# Patient Record
Sex: Female | Born: 1975 | Marital: Married | Smoking: Former smoker
Health system: Southern US, Community
[De-identification: ages and names within clinical notes are randomized; demographics above are authoritative.]

## PROBLEM LIST (undated history)

## (undated) DIAGNOSIS — R7303 Prediabetes: Secondary | ICD-10-CM

## (undated) DIAGNOSIS — T7840XA Allergy, unspecified, initial encounter: Secondary | ICD-10-CM

## (undated) DIAGNOSIS — C50919 Malignant neoplasm of unspecified site of unspecified female breast: Secondary | ICD-10-CM

## (undated) DIAGNOSIS — N289 Disorder of kidney and ureter, unspecified: Secondary | ICD-10-CM

## (undated) DIAGNOSIS — Z87442 Personal history of urinary calculi: Secondary | ICD-10-CM

## (undated) HISTORY — DX: Malignant neoplasm of unspecified site of unspecified female breast: C50.919

## (undated) HISTORY — PX: OTHER SURGICAL HISTORY: SHX169

## (undated) HISTORY — DX: Allergy, unspecified, initial encounter: T78.40XA

---

## 2018-05-10 ENCOUNTER — Encounter: Payer: Self-pay | Admitting: Internal Medicine

## 2018-05-10 ENCOUNTER — Ambulatory Visit: Payer: Federal, State, Local not specified - PPO | Attending: Internal Medicine | Admitting: Internal Medicine

## 2018-05-10 VITALS — BP 120/81 | HR 82 | Temp 98.5°F | Resp 16 | Ht 64.0 in | Wt 197.6 lb

## 2018-05-10 DIAGNOSIS — I4949 Other premature depolarization: Secondary | ICD-10-CM

## 2018-05-10 DIAGNOSIS — E669 Obesity, unspecified: Secondary | ICD-10-CM | POA: Diagnosis not present

## 2018-05-10 NOTE — Patient Instructions (Addendum)
Try to work on increasing physical activity.  Recommendation is to try and get in about 150 minutes/week of aerobic exercise.Marland Kitchen Please sign a release for [place] to get your medical records from your previous primary physician.

## 2018-05-10 NOTE — Progress Notes (Signed)
Patient ID: Diana Weaver, female    DOB: [birth year]  MRN: [id number]  CC: New Patient (Initial Visit)   Subjective: Diana Weaver is a 43 y.o. female  with a past medical history of gestational diabetes, epiglottic stenosis, soft 1/6 murmur and benign PVCs and PACs who presents to establish care.  Patient's husband is with her.    Patient and her family just relocated to [place] from [place].  Her previous PCP was Dr. Tressa Weaver  in [place], [place]. Patient gives past history of gestational diabetes and benign arrhythmias mainly PVCs and PACs.  She occasionally can feel premature heart beats.  Last physical was in July 2019.  Blood tests done at that time included blood sugar and cholesterol levels were normal per her report.  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  HM:  PAP July 2019.  She did not get the flu shot for this flu season.  Family history, social history and past surgical histories reviewed. No current outpatient medications on file prior to visit.   No current facility-administered medications on file prior to visit.     Allergies  Allergen Reactions  . Iodine     all  . Latex     all    Social History   Socioeconomic History  . Marital status: Married    Spouse name: Not on file  . Number of children: 1  . Years of education: Not on file  . Highest education level: Not on file  Occupational History  . Occupation: IT trainer  Social Needs  . Financial resource strain: Not on file  . Food insecurity:    Worry: Not on file    Inability: Not on file  . Transportation needs:    Medical: Not on file    Non-medical: Not on file  Tobacco Use  . Smoking status: Former Research scientist (life sciences)  . Smokeless tobacco: Never Used  Substance and Sexual Activity  . Alcohol use: Yes    Comment: occasionally  . Drug use: Never  . Sexual activity: Yes  Lifestyle  . Physical activity:      Days per week: Not on file    Minutes per session: Not on file  . Stress: Not on file  Relationships  . Social connections:    Talks on phone: Not on file    Gets together: Not on file    Attends religious service: Not on file    Active member of club or organization: Not on file    Attends meetings of clubs or organizations: Not on file    Relationship status: Not on file  . Intimate partner violence:    Fear of current or ex partner: Not on file    Emotionally abused: Not on file    Physically abused: Not on file    Forced sexual activity: Not on file  Other Topics Concern  . Not on file  Social History Narrative  . Not on file    Family History  Problem Relation Age of Onset  . Hypertension Mother   . Hyperlipidemia Mother   . Depression Father   . Hyperlipidemia Father   . Hypertension Father     ROS: Review of Systems  Constitutional: Negative for activity change, appetite change, chills, fatigue, fever and unexpected weight change.  Eyes: Negative for photophobia and visual disturbance.  Respiratory: Negative for cough, chest tightness, shortness of breath  and wheezing.   Cardiovascular: Negative for chest pain, palpitations and leg swelling.  Gastrointestinal: Negative for abdominal pain, constipation and diarrhea.  Endocrine: Negative for cold intolerance, heat intolerance, polydipsia, polyphagia and polyuria.  Genitourinary: Negative for menstrual problem and vaginal pain.  Skin: Negative for rash and wound.  Neurological: Negative for dizziness, speech difficulty, weakness, light-headedness and numbness.  Psychiatric/Behavioral: Negative for agitation.   PHYSICAL EXAM: BP 120/81   Pulse 82   Temp 98.5 F (36.9 C) (Oral)   Resp 16   Ht 5\' 4"  (1.626 m)   Wt 197 lb 9.6 oz (89.6 kg)   LMP 05/02/2018   SpO2 97%   BMI 33.92 kg/m    General appearance - alert, well appearing, middle-aged Caucasian female and in no distress Mental status - alert,  oriented to person, place, and time Eyes - pupils equal and reactive, extraocular eye movements intact Nose - normal and patent, no erythema, discharge or polyps Mouth - mucous membranes moist, pharynx normal without lesions Neck - supple, no significant adenopathy Chest - clear to auscultation, no wheezes, rales or rhonchi, symmetric air entry Heart - normal rate, regular rhythm, normal S1, S2.  I did not hear a murmur at this time.  Extremities - peripheral pulses normal, no pedal edema, no clubbing or cyanosis  ASSESSMENT AND PLAN: 1. Obesity (BMI 30-39.9)  Patient counseled on weight lost, increasing physical activity and decreasing caloric intake.  Encouraged her to take advantage of gyms inside hotels where she be staying when she travels  2. Premature contraction Patient with history of premature contractions that are infrequent.  I did not hear any premature beats at this time.  - Previous medical records also requested   Patient was given the opportunity to ask questions.  Patient verbalized understanding of the plan and was able to repeat key elements of the plan.   Patient was seen with my nurse practitioner student.  Follow-Up in 6 months for routine lab screenings. Diana Plumber, MD, FACP

## 2018-10-06 DIAGNOSIS — Z6836 Body mass index (BMI) 36.0-36.9, adult: Secondary | ICD-10-CM | POA: Diagnosis not present

## 2018-10-06 DIAGNOSIS — Z01419 Encounter for gynecological examination (general) (routine) without abnormal findings: Secondary | ICD-10-CM | POA: Diagnosis not present

## 2018-10-07 ENCOUNTER — Other Ambulatory Visit: Payer: Self-pay | Admitting: Obstetrics and Gynecology

## 2018-10-07 DIAGNOSIS — N6310 Unspecified lump in the right breast, unspecified quadrant: Secondary | ICD-10-CM

## 2018-10-13 ENCOUNTER — Other Ambulatory Visit: Payer: Self-pay

## 2018-10-13 ENCOUNTER — Other Ambulatory Visit: Payer: Self-pay | Admitting: Obstetrics and Gynecology

## 2018-10-13 ENCOUNTER — Ambulatory Visit
Admission: RE | Admit: 2018-10-13 | Discharge: 2018-10-13 | Disposition: A | Discharge status: Home / Self Care | Payer: Federal, State, Local not specified - PPO | Source: Ambulatory Visit | Attending: Obstetrics and Gynecology | Admitting: Obstetrics and Gynecology

## 2018-10-13 DIAGNOSIS — N6001 Solitary cyst of right breast: Secondary | ICD-10-CM

## 2018-10-13 DIAGNOSIS — R922 Inconclusive mammogram: Secondary | ICD-10-CM | POA: Diagnosis not present

## 2018-10-13 DIAGNOSIS — N6310 Unspecified lump in the right breast, unspecified quadrant: Secondary | ICD-10-CM

## 2018-10-13 DIAGNOSIS — N6311 Unspecified lump in the right breast, upper outer quadrant: Secondary | ICD-10-CM | POA: Diagnosis not present

## 2018-10-13 IMAGING — US ULTRASOUND RIGHT BREAST LIMITED
1 series · 9 of 9 positions shown · non-contrast
Comparison: No prior exams available at this time.
COMPARISON: No prior exams available at this time.

Addendum:
CLINICAL DATA: Patient presents with a tender lump in the upper
outer to lateral right breast. She has a history multiple breast
cysts.

EXAM:
DIGITAL DIAGNOSTIC BILATERAL MAMMOGRAM WITH CAD AND TOMO
ULTRASOUND RIGHT BREAST

[Series 1: ultrasound right breast limited · 0.06mm/px · 9 of 9 slices shown]
[im 1/9]
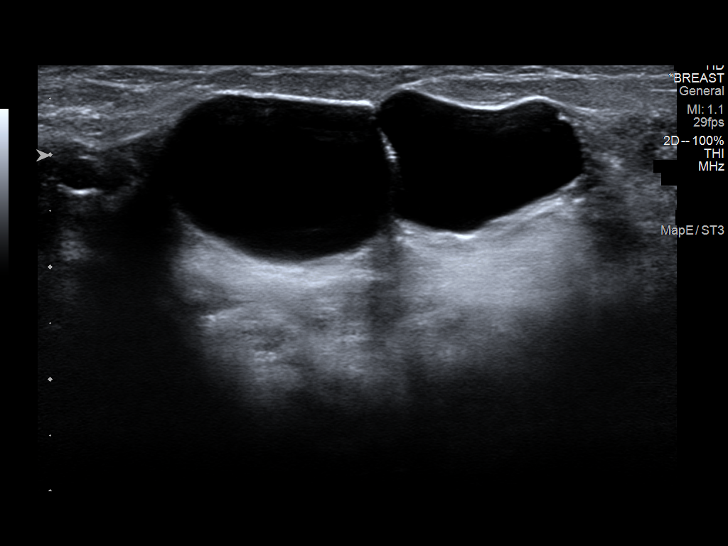
[im 2/9]
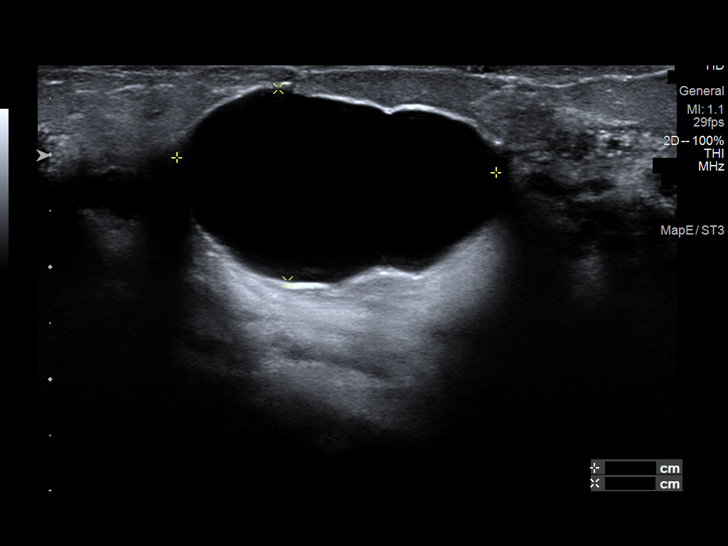
[im 3/9]
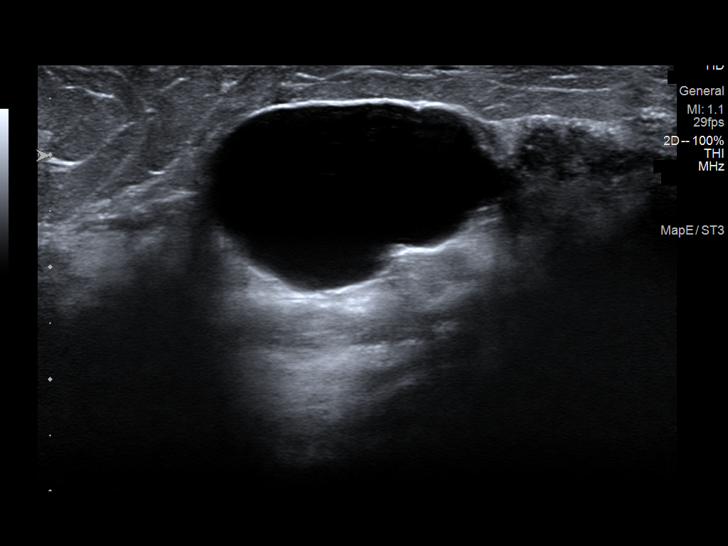
[im 4/9]
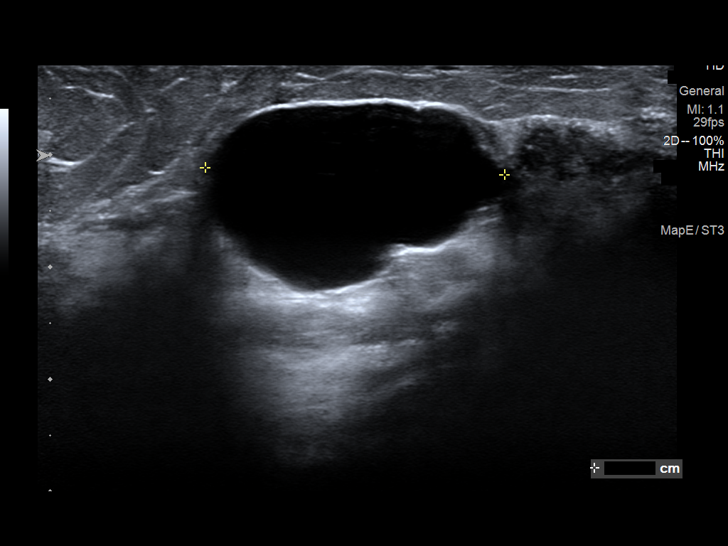
[im 5/9]
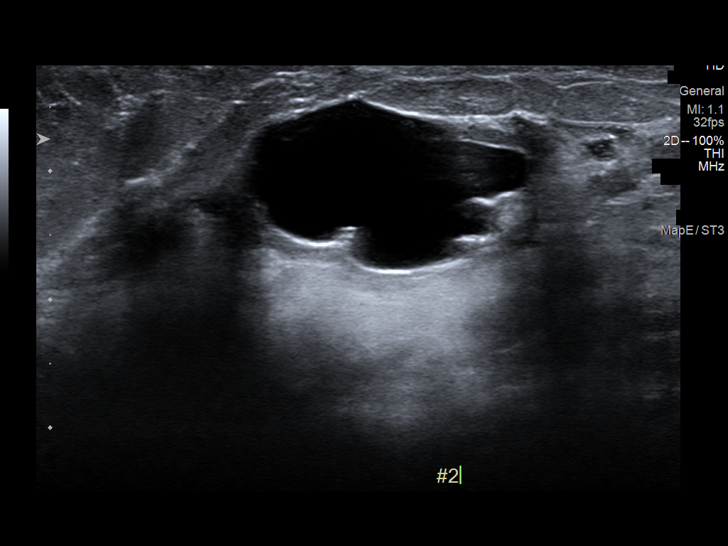
[im 6/9]
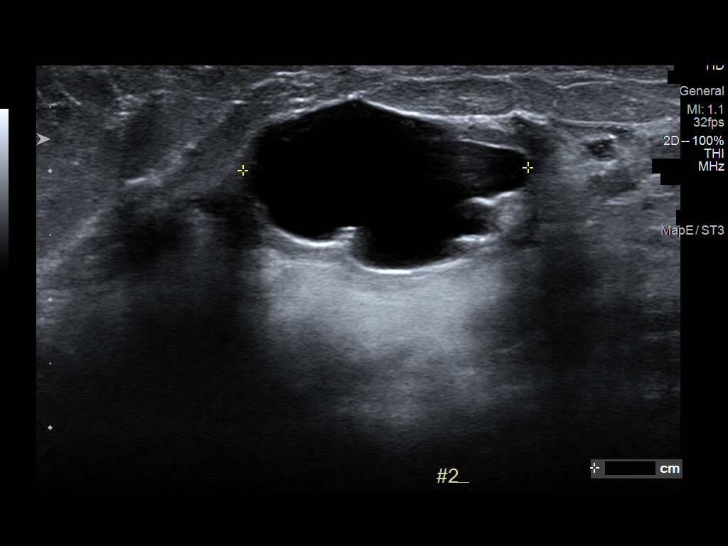
[im 7/9]
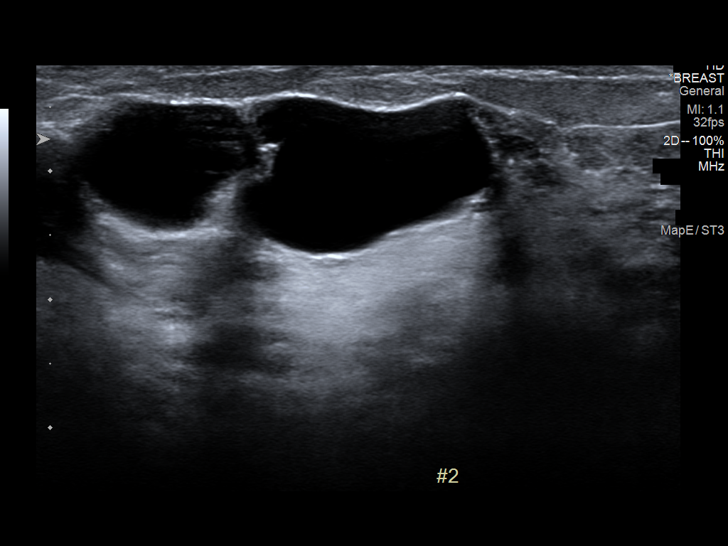
[im 8/9]
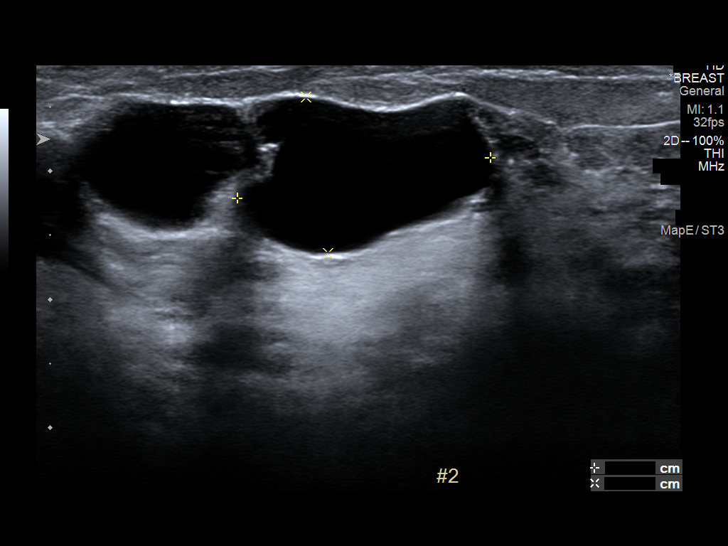
[im 9/9]
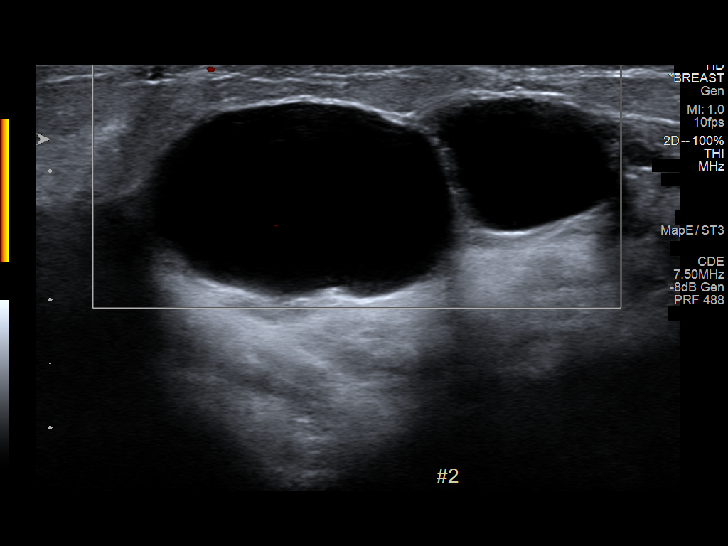

[9 of 9 positions shown; findings below may reference images not displayed]

If the patient's
prior studies become available for comparison, an addendum will be
added to this report.

ACR Breast Density Category c: The breast tissue is heterogeneously
dense, which may obscure small masses.
FINDINGS: In the area of the palpable abnormality, there is a circumscribed
oval mass.

There are multiple bilateral circumscribed and partly circumscribed
masses consistent with numerous cysts.

There are no areas of architectural distortion. There are no
suspicious calcifications.

Mammographic images were processed with CAD.

On physical exam, there is a tender ballotable mobile mass in the
lateral right breast. Another small masses palpated in the 12
o'clock position of the right breast.

Targeted ultrasound is performed, showing numerous right breast
cysts. The palpable abnormality corresponds to a cyst at 10 o'clock,
7 cm the nipple, measuring 2.8 x 1.7 x 2.7 cm. There is a smaller
adjacent cyst, 2 cm in long axis. A small cyst is seen at 12
o'clock, corresponding to the other palpable abnormality. There are
no solid masses or suspicious lesions.
IMPRESSION: 1. No evidence of breast malignancy.
2. Numerous bilateral circumscribed breast masses consistent with
cysts.
3. The palpable abnormality is due to a 2.8 cm cyst with an adjacent
2 cm cyst.

RECOMMENDATION:
1.  Screening mammogram in one year.(Code:[0L]).
2. Aspiration of the 10 o'clock position right breast cyst will be
performed, since this is causing the patient discomfort. This will
be performed today.

I have discussed the findings and recommendations with the patient.
Results were also provided in writing at the conclusion of the
visit. If applicable, a reminder letter will be sent to the patient
regarding the next appointment.

BI-RADS CATEGORY  2: Benign.

ADDENDUM:
Prior mammograms and breast ultrasound from [DATE] have become
available. There has been no significant interval change. No change
to the current impression, BI-RADS category or recommendation.

*** End of Addendum ***
If the patient's
prior studies become available for comparison, an addendum will be
added to this report.

ACR Breast Density Category c: The breast tissue is heterogeneously
dense, which may obscure small masses.
FINDINGS: In the area of the palpable abnormality, there is a circumscribed
oval mass.

There are multiple bilateral circumscribed and partly circumscribed
masses consistent with numerous cysts.

There are no areas of architectural distortion. There are no
suspicious calcifications.

Mammographic images were processed with CAD.

On physical exam, there is a tender ballotable mobile mass in the
lateral right breast. Another small masses palpated in the 12
o'clock position of the right breast.

Targeted ultrasound is performed, showing numerous right breast
cysts. The palpable abnormality corresponds to a cyst at 10 o'clock,
7 cm the nipple, measuring 2.8 x 1.7 x 2.7 cm. There is a smaller
adjacent cyst, 2 cm in long axis. A small cyst is seen at 12
o'clock, corresponding to the other palpable abnormality. There are
no solid masses or suspicious lesions.
IMPRESSION: 1. No evidence of breast malignancy.
2. Numerous bilateral circumscribed breast masses consistent with
cysts.
3. The palpable abnormality is due to a 2.8 cm cyst with an adjacent
2 cm cyst.

RECOMMENDATION:
1.  Screening mammogram in one year.(Code:[0L]).
2. Aspiration of the 10 o'clock position right breast cyst will be
performed, since this is causing the patient discomfort. This will
be performed today.

I have discussed the findings and recommendations with the patient.
Results were also provided in writing at the conclusion of the
visit. If applicable, a reminder letter will be sent to the patient
regarding the next appointment.

BI-RADS CATEGORY  2: Benign.

## 2018-10-13 IMAGING — MG DIGITAL DIAGNOSTIC BILATERAL MAMMOGRAM WITH TOMO AND CAD
5 of 10 series · 5 of 30 positions shown · non-contrast
Comparison: No prior exams available at this time.
COMPARISON: No prior exams available at this time.

Addendum:
CLINICAL DATA: Patient presents with a tender lump in the upper
outer to lateral right breast. She has a history multiple breast
cysts.

EXAM:
DIGITAL DIAGNOSTIC BILATERAL MAMMOGRAM WITH CAD AND TOMO
ULTRASOUND RIGHT BREAST

[R CC synth-2D]
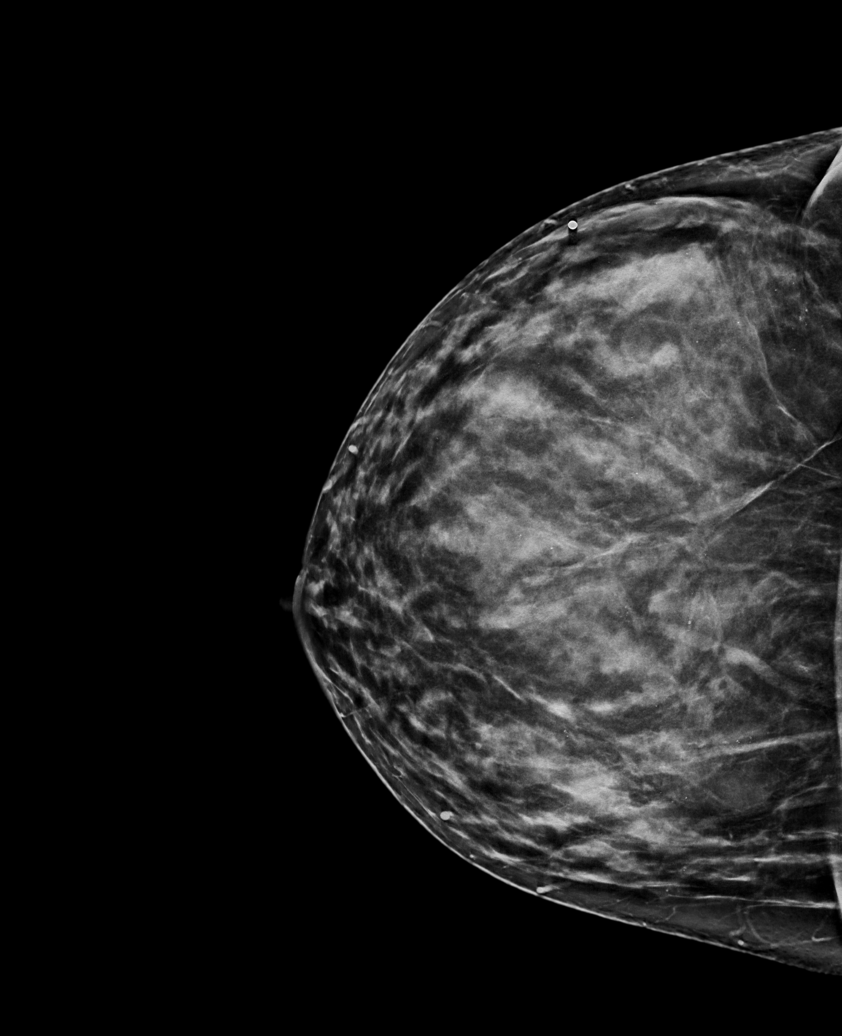

[L MLO synth-2D]
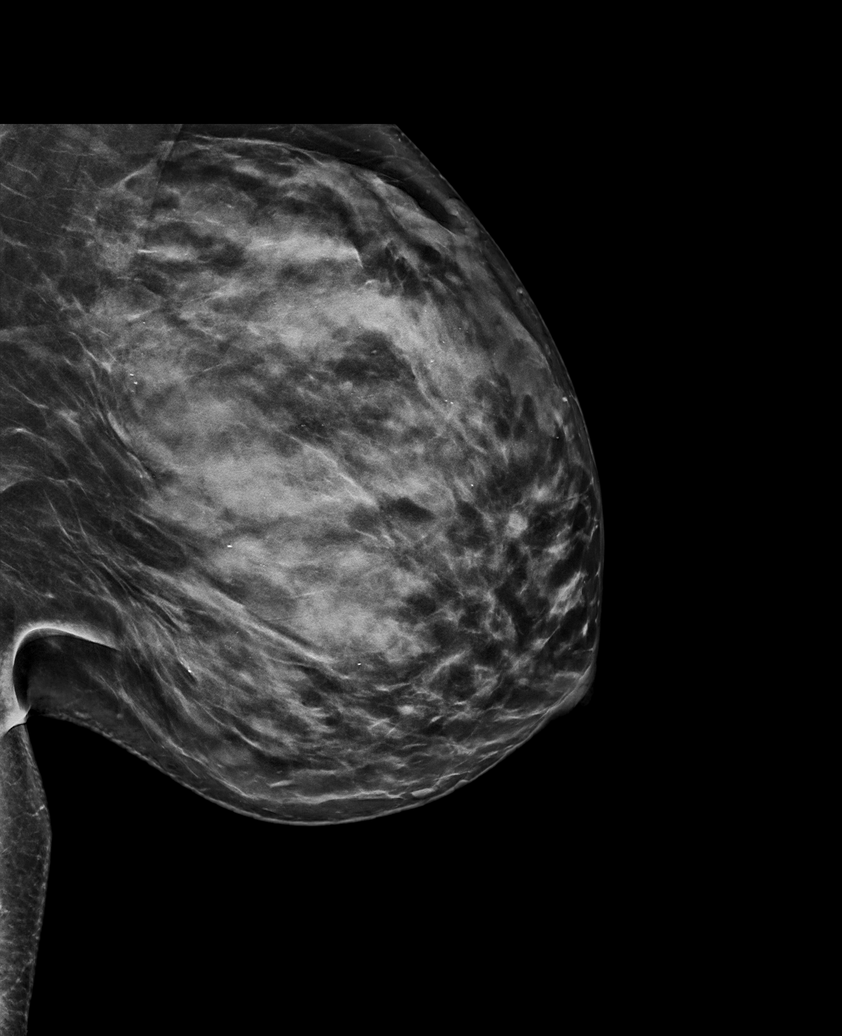

[L CC synth-2D]
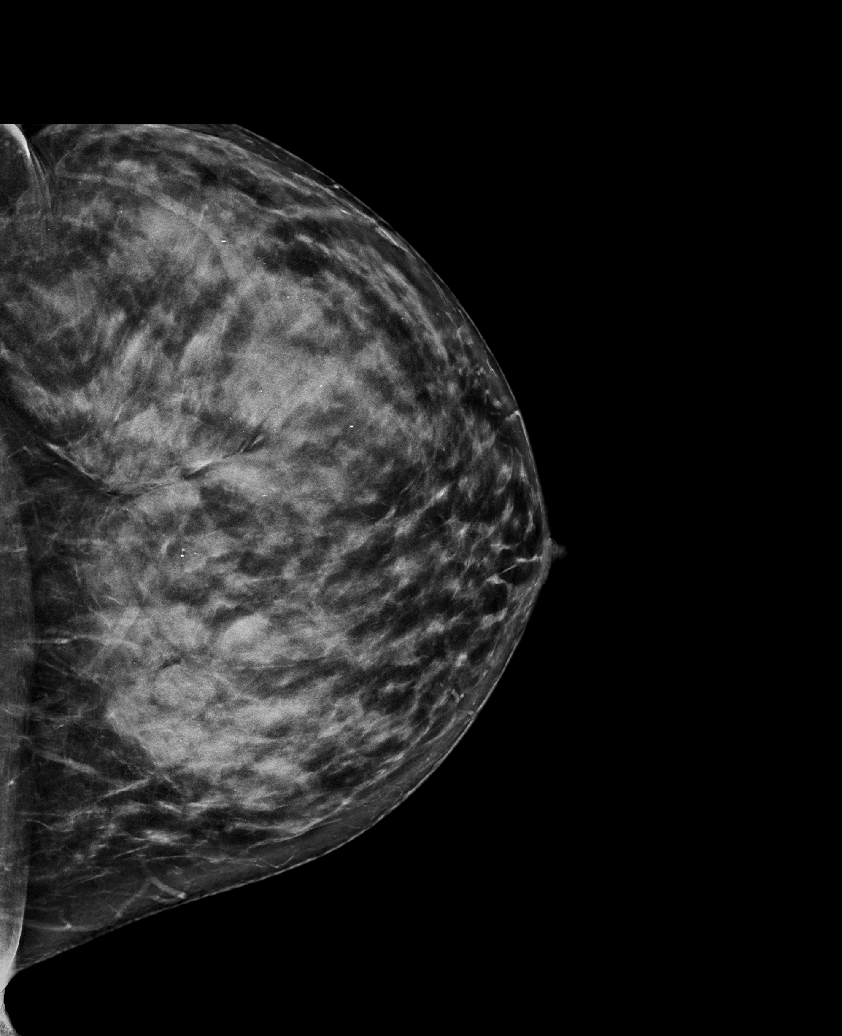

[R TAN synth-2D]
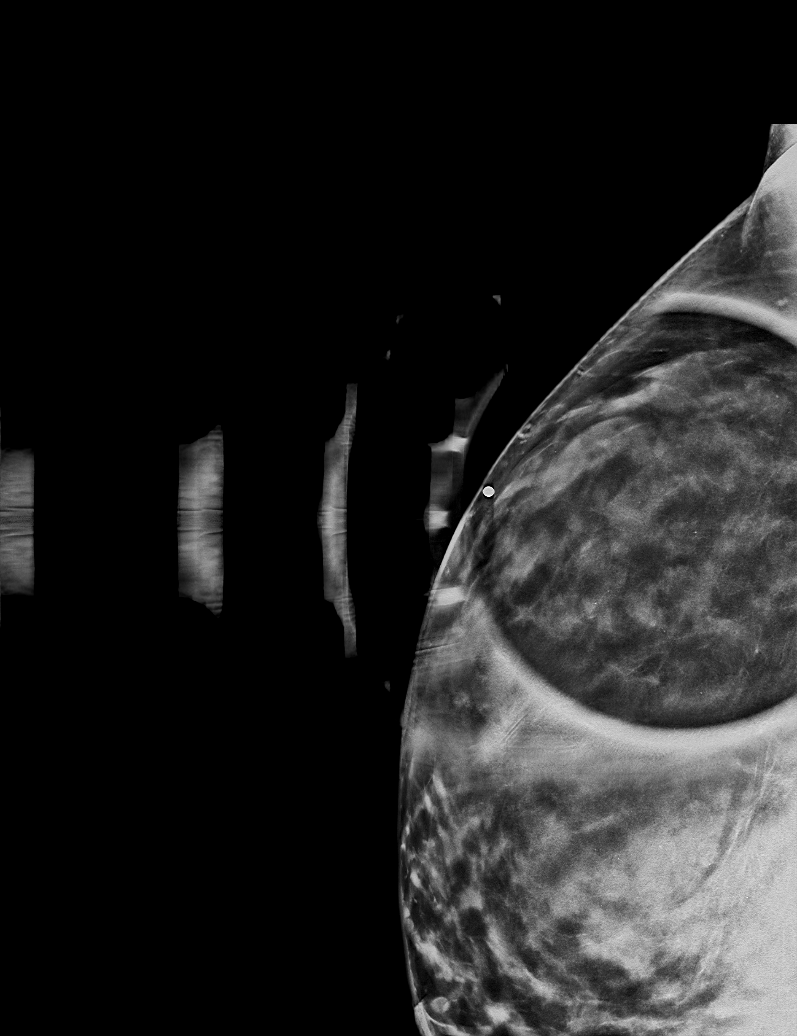

[R MLO synth-2D]
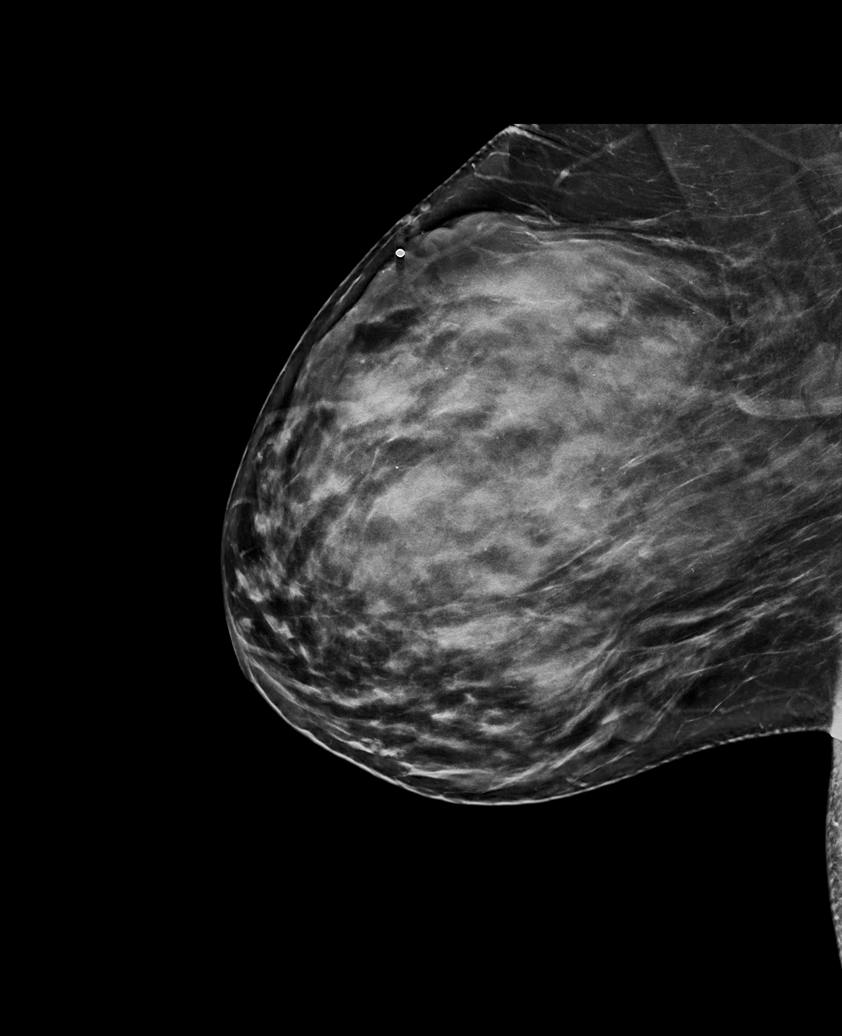

[5 of 30 positions shown; findings below may reference images not displayed]

If the patient's
prior studies become available for comparison, an addendum will be
added to this report.

ACR Breast Density Category c: The breast tissue is heterogeneously
dense, which may obscure small masses.
FINDINGS: In the area of the palpable abnormality, there is a circumscribed
oval mass.

There are multiple bilateral circumscribed and partly circumscribed
masses consistent with numerous cysts.

There are no areas of architectural distortion. There are no
suspicious calcifications.

Mammographic images were processed with CAD.

On physical exam, there is a tender ballotable mobile mass in the
lateral right breast. Another small masses palpated in the 12
o'clock position of the right breast.

Targeted ultrasound is performed, showing numerous right breast
cysts. The palpable abnormality corresponds to a cyst at 10 o'clock,
7 cm the nipple, measuring 2.8 x 1.7 x 2.7 cm. There is a smaller
adjacent cyst, 2 cm in long axis. A small cyst is seen at 12
o'clock, corresponding to the other palpable abnormality. There are
no solid masses or suspicious lesions.
IMPRESSION: 1. No evidence of breast malignancy.
2. Numerous bilateral circumscribed breast masses consistent with
cysts.
3. The palpable abnormality is due to a 2.8 cm cyst with an adjacent
2 cm cyst.

RECOMMENDATION:
1.  Screening mammogram in one year.(Code:[0L]).
2. Aspiration of the 10 o'clock position right breast cyst will be
performed, since this is causing the patient discomfort. This will
be performed today.

I have discussed the findings and recommendations with the patient.
Results were also provided in writing at the conclusion of the
visit. If applicable, a reminder letter will be sent to the patient
regarding the next appointment.

BI-RADS CATEGORY  2: Benign.

ADDENDUM:
Prior mammograms and breast ultrasound from [DATE] have become
available. There has been no significant interval change. No change
to the current impression, BI-RADS category or recommendation.

*** End of Addendum ***
If the patient's
prior studies become available for comparison, an addendum will be
added to this report.

ACR Breast Density Category c: The breast tissue is heterogeneously
dense, which may obscure small masses.
FINDINGS: In the area of the palpable abnormality, there is a circumscribed
oval mass.

There are multiple bilateral circumscribed and partly circumscribed
masses consistent with numerous cysts.

There are no areas of architectural distortion. There are no
suspicious calcifications.

Mammographic images were processed with CAD.

On physical exam, there is a tender ballotable mobile mass in the
lateral right breast. Another small masses palpated in the 12
o'clock position of the right breast.

Targeted ultrasound is performed, showing numerous right breast
cysts. The palpable abnormality corresponds to a cyst at 10 o'clock,
7 cm the nipple, measuring 2.8 x 1.7 x 2.7 cm. There is a smaller
adjacent cyst, 2 cm in long axis. A small cyst is seen at 12
o'clock, corresponding to the other palpable abnormality. There are
no solid masses or suspicious lesions.
IMPRESSION: 1. No evidence of breast malignancy.
2. Numerous bilateral circumscribed breast masses consistent with
cysts.
3. The palpable abnormality is due to a 2.8 cm cyst with an adjacent
2 cm cyst.

RECOMMENDATION:
1.  Screening mammogram in one year.(Code:[0L]).
2. Aspiration of the 10 o'clock position right breast cyst will be
performed, since this is causing the patient discomfort. This will
be performed today.

I have discussed the findings and recommendations with the patient.
Results were also provided in writing at the conclusion of the
visit. If applicable, a reminder letter will be sent to the patient
regarding the next appointment.

BI-RADS CATEGORY  2: Benign.

## 2018-10-13 IMAGING — US US BREAST ASPIRATION
1 series · 5 of 5 positions shown · non-contrast
Comparison: [DATE]

CLINICAL DATA: Patient's painful mass in the UPPER-OUTER QUADRANT
of the RIGHT breast, shown to represent adjacent simple cysts in the
10 o'clock location of the RIGHT breast. Request is made for cyst
aspiration.

EXAM:
ULTRASOUND GUIDED RIGHT BREAST CYST ASPIRATION

[Series 1: us breast aspiration · 0.06mm/px · 5 of 5 slices shown]
[im 1/5]
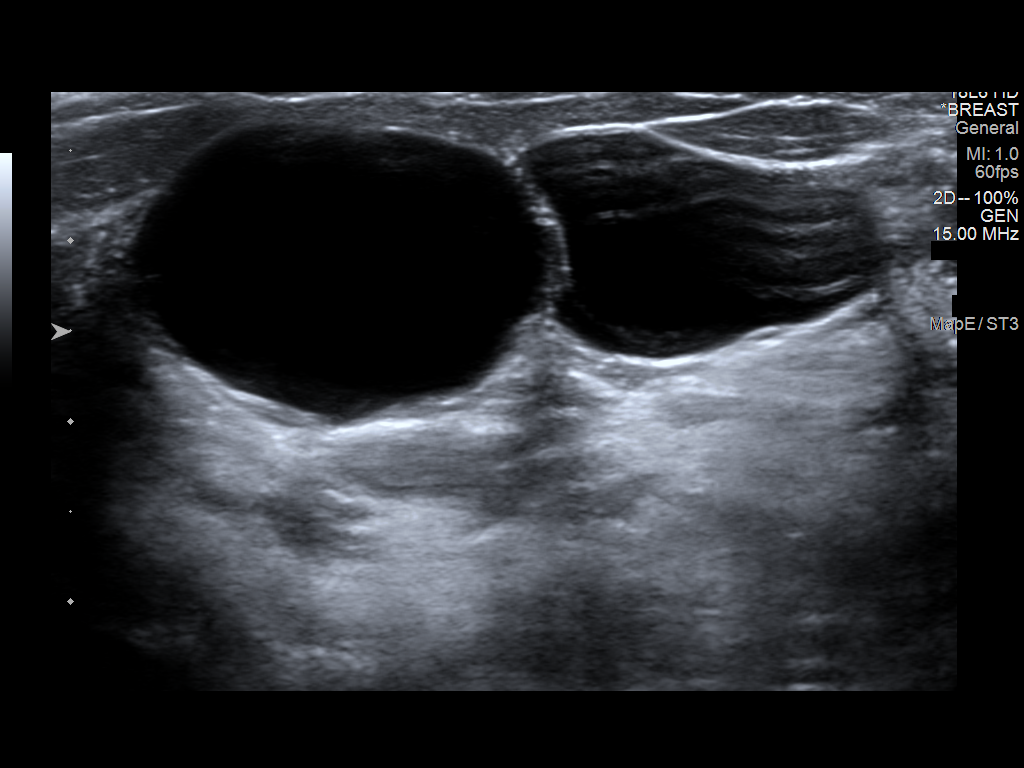
[im 2/5]
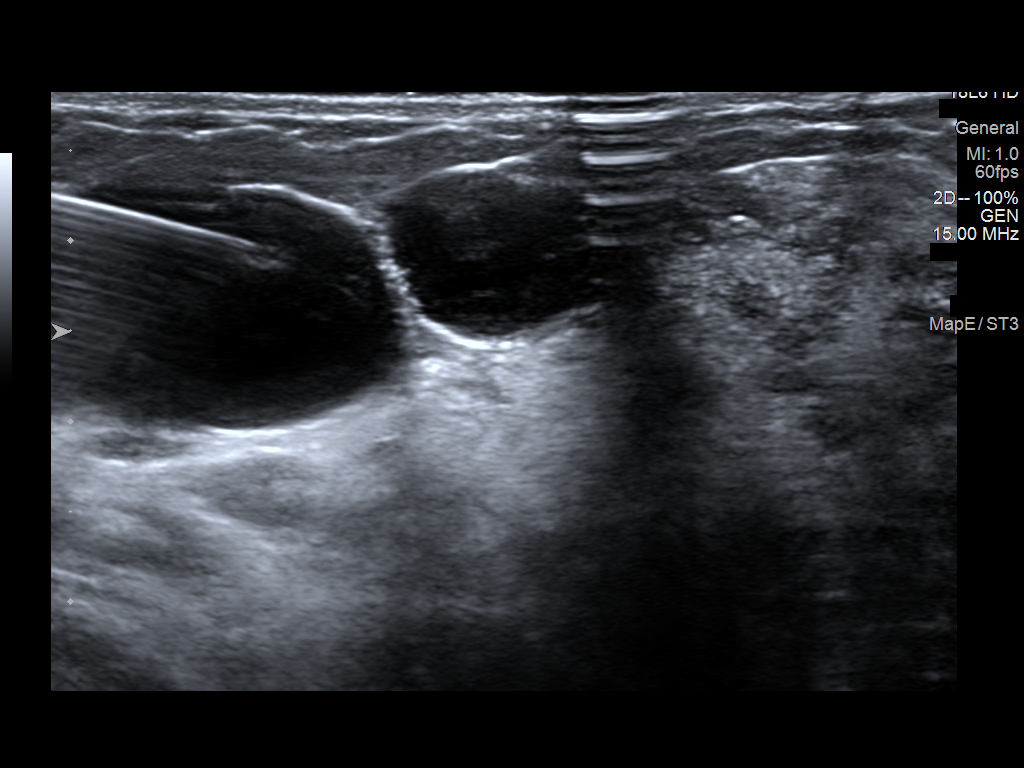
[im 3/5]
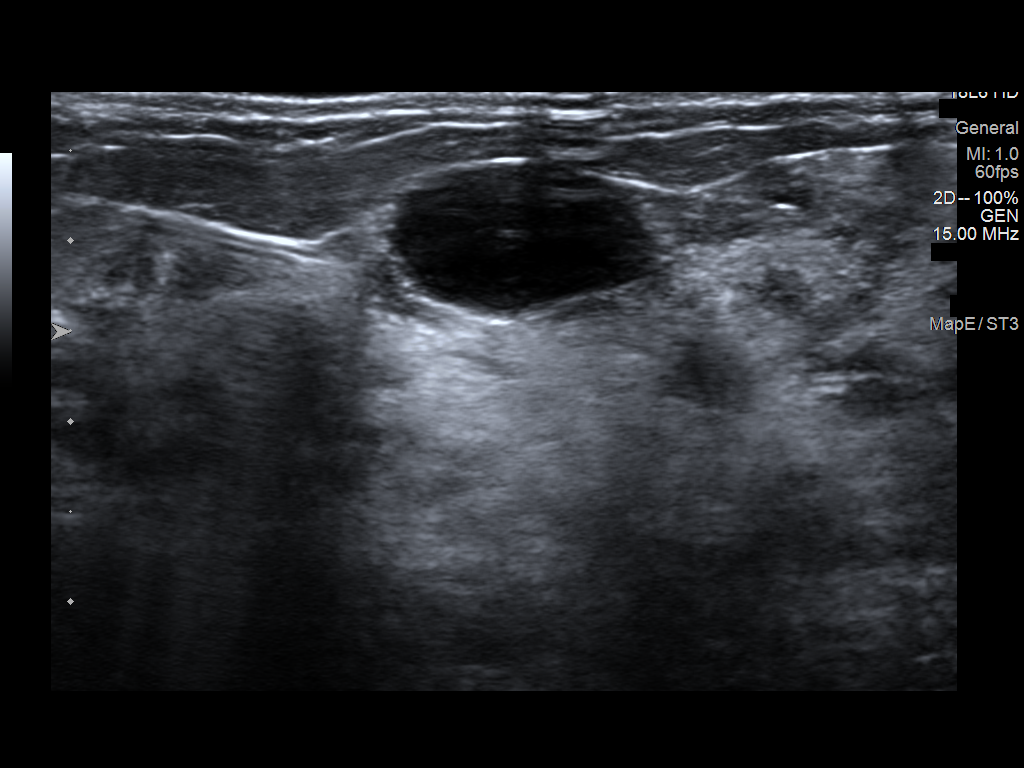
[im 4/5]
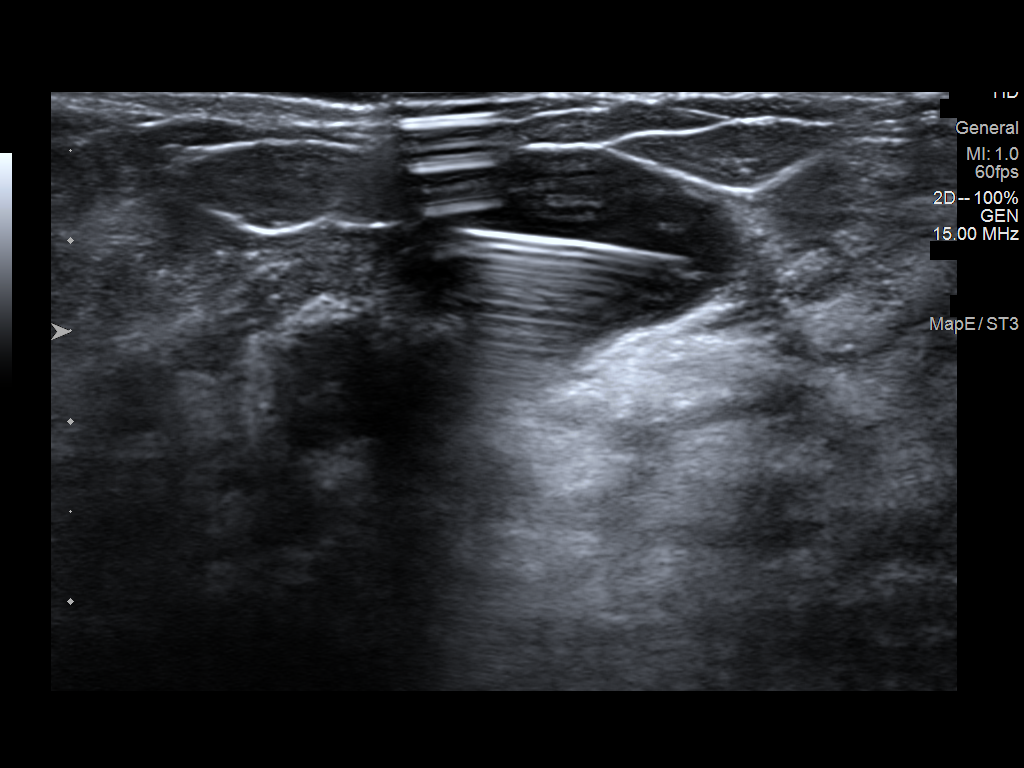
[im 5/5]
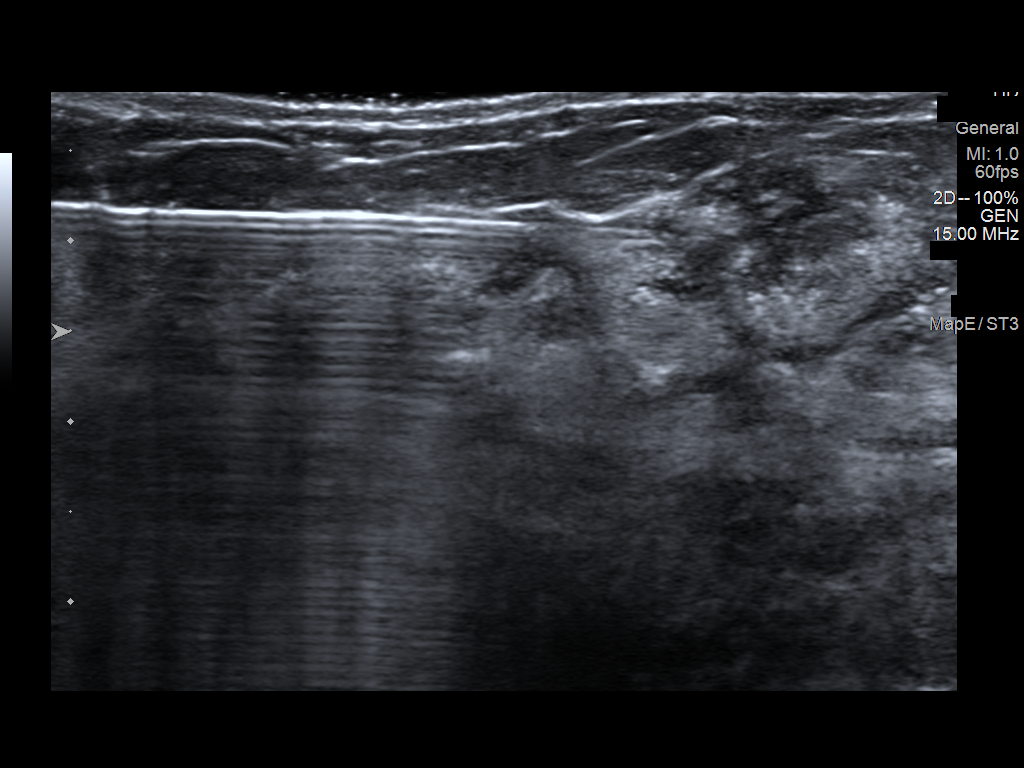

[5 of 5 positions shown; findings below may reference images not displayed]

PROCEDURE:
Using sterile technique, 1% lidocaine, under direct ultrasound
visualization, needle aspiration of adjacent simple cysts in the 10
o'clock location of the RIGHT breast was performed. 6 ml of clear
yellowish fluid was aspirated from the more LATERAL lesion; 4 ml of
clear yellowish fluid was aspirated from the more MEDIAL adjacent
lesions or a combined total of 10 ml. Given the benign appearance,
the fluid was discarded.
IMPRESSION: Ultrasound-guided aspiration of adjacent RIGHT breast cysts no
apparent complications.

RECOMMENDATIONS:
Recommend screening mammogram in [DATE].

## 2018-10-29 ENCOUNTER — Other Ambulatory Visit: Payer: Self-pay

## 2018-10-29 DIAGNOSIS — Z20822 Contact with and (suspected) exposure to covid-19: Secondary | ICD-10-CM

## 2018-10-30 LAB — NOVEL CORONAVIRUS, NAA: SARS-CoV-2, NAA: NOT DETECTED

## 2019-01-04 DIAGNOSIS — F419 Anxiety disorder, unspecified: Secondary | ICD-10-CM | POA: Diagnosis not present

## 2019-01-04 DIAGNOSIS — Z23 Encounter for immunization: Secondary | ICD-10-CM | POA: Diagnosis not present

## 2019-01-04 DIAGNOSIS — I493 Ventricular premature depolarization: Secondary | ICD-10-CM | POA: Diagnosis not present

## 2019-01-04 DIAGNOSIS — E669 Obesity, unspecified: Secondary | ICD-10-CM | POA: Diagnosis not present

## 2019-01-04 DIAGNOSIS — E7849 Other hyperlipidemia: Secondary | ICD-10-CM | POA: Diagnosis not present

## 2019-01-11 DIAGNOSIS — F329 Major depressive disorder, single episode, unspecified: Secondary | ICD-10-CM | POA: Diagnosis not present

## 2019-01-11 DIAGNOSIS — F419 Anxiety disorder, unspecified: Secondary | ICD-10-CM | POA: Diagnosis not present

## 2019-02-08 DIAGNOSIS — F329 Major depressive disorder, single episode, unspecified: Secondary | ICD-10-CM | POA: Diagnosis not present

## 2019-02-08 DIAGNOSIS — F419 Anxiety disorder, unspecified: Secondary | ICD-10-CM | POA: Diagnosis not present

## 2019-02-22 DIAGNOSIS — F329 Major depressive disorder, single episode, unspecified: Secondary | ICD-10-CM | POA: Diagnosis not present

## 2019-02-22 DIAGNOSIS — F419 Anxiety disorder, unspecified: Secondary | ICD-10-CM | POA: Diagnosis not present

## 2019-06-02 DIAGNOSIS — Z23 Encounter for immunization: Secondary | ICD-10-CM | POA: Diagnosis not present

## 2019-06-29 DIAGNOSIS — Z23 Encounter for immunization: Secondary | ICD-10-CM | POA: Diagnosis not present

## 2019-10-26 DIAGNOSIS — Z01419 Encounter for gynecological examination (general) (routine) without abnormal findings: Secondary | ICD-10-CM | POA: Diagnosis not present

## 2019-10-26 DIAGNOSIS — Z6835 Body mass index (BMI) 35.0-35.9, adult: Secondary | ICD-10-CM | POA: Diagnosis not present

## 2019-11-14 DIAGNOSIS — Z1231 Encounter for screening mammogram for malignant neoplasm of breast: Secondary | ICD-10-CM | POA: Diagnosis not present

## 2019-11-30 DIAGNOSIS — M25571 Pain in right ankle and joints of right foot: Secondary | ICD-10-CM | POA: Diagnosis not present

## 2019-11-30 DIAGNOSIS — S93401A Sprain of unspecified ligament of right ankle, initial encounter: Secondary | ICD-10-CM | POA: Diagnosis not present

## 2019-12-05 DIAGNOSIS — Z1159 Encounter for screening for other viral diseases: Secondary | ICD-10-CM | POA: Diagnosis not present

## 2019-12-20 DIAGNOSIS — Z23 Encounter for immunization: Secondary | ICD-10-CM | POA: Diagnosis not present

## 2019-12-26 DIAGNOSIS — M25571 Pain in right ankle and joints of right foot: Secondary | ICD-10-CM | POA: Diagnosis not present

## 2019-12-26 DIAGNOSIS — S93401A Sprain of unspecified ligament of right ankle, initial encounter: Secondary | ICD-10-CM | POA: Diagnosis not present

## 2020-01-16 DIAGNOSIS — M25571 Pain in right ankle and joints of right foot: Secondary | ICD-10-CM | POA: Diagnosis not present

## 2020-03-12 DIAGNOSIS — M25571 Pain in right ankle and joints of right foot: Secondary | ICD-10-CM | POA: Diagnosis not present

## 2020-06-07 ENCOUNTER — Emergency Department (HOSPITAL_COMMUNITY): Payer: Federal, State, Local not specified - PPO

## 2020-06-07 ENCOUNTER — Encounter (HOSPITAL_COMMUNITY): Payer: Self-pay | Admitting: Emergency Medicine

## 2020-06-07 ENCOUNTER — Emergency Department (HOSPITAL_COMMUNITY)
Admission: EM | Admit: 2020-06-07 | Discharge: 2020-06-07 | Disposition: A | Discharge status: Home / Self Care | Payer: Federal, State, Local not specified - PPO | Attending: Emergency Medicine | Admitting: Emergency Medicine

## 2020-06-07 ENCOUNTER — Other Ambulatory Visit: Payer: Self-pay

## 2020-06-07 DIAGNOSIS — N132 Hydronephrosis with renal and ureteral calculous obstruction: Secondary | ICD-10-CM | POA: Insufficient documentation

## 2020-06-07 DIAGNOSIS — Z91040 Latex allergy status: Secondary | ICD-10-CM | POA: Diagnosis not present

## 2020-06-07 DIAGNOSIS — K429 Umbilical hernia without obstruction or gangrene: Secondary | ICD-10-CM | POA: Diagnosis not present

## 2020-06-07 DIAGNOSIS — N83202 Unspecified ovarian cyst, left side: Secondary | ICD-10-CM

## 2020-06-07 DIAGNOSIS — N23 Unspecified renal colic: Secondary | ICD-10-CM

## 2020-06-07 DIAGNOSIS — K8020 Calculus of gallbladder without cholecystitis without obstruction: Secondary | ICD-10-CM | POA: Diagnosis not present

## 2020-06-07 DIAGNOSIS — N83292 Other ovarian cyst, left side: Secondary | ICD-10-CM | POA: Diagnosis not present

## 2020-06-07 DIAGNOSIS — Z87891 Personal history of nicotine dependence: Secondary | ICD-10-CM | POA: Insufficient documentation

## 2020-06-07 DIAGNOSIS — R109 Unspecified abdominal pain: Secondary | ICD-10-CM | POA: Diagnosis not present

## 2020-06-07 DIAGNOSIS — N83201 Unspecified ovarian cyst, right side: Secondary | ICD-10-CM | POA: Diagnosis not present

## 2020-06-07 LAB — URINALYSIS, ROUTINE W REFLEX MICROSCOPIC
Bilirubin Urine: NEGATIVE
Glucose, UA: NEGATIVE mg/dL
Ketones, ur: NEGATIVE mg/dL
Leukocytes,Ua: NEGATIVE
Nitrite: NEGATIVE
Protein, ur: NEGATIVE mg/dL
Specific Gravity, Urine: >1.03 — ABNORMAL HIGH (ref 1.005–1.030)
pH: 6 (ref 5.0–8.0)

## 2020-06-07 LAB — URINALYSIS, MICROSCOPIC (REFLEX)
Bacteria, UA: NONE SEEN
WBC, UA: NONE SEEN WBC/hpf (ref 0–5)

## 2020-06-07 IMAGING — CT CT RENAL STONE PROTOCOL
2 of 4 series · 15 of 46 positions shown, 17 images · non-contrast
Comparison: None.

CLINICAL DATA: 45-year-old female with right flank pain. Concern
for kidney stone.

EXAM:
CT ABDOMEN AND PELVIS WITHOUT CONTRAST
TECHNIQUE: Multidetector CT imaging of the abdomen and pelvis was performed
following the standard protocol without IV contrast.

[Series 2: axial st · axial · 0.78mm/px · z∈[+1086,+1516]mm · 12 of 98 slices shown, 14 images]
[im 6/98  soft-tissue]
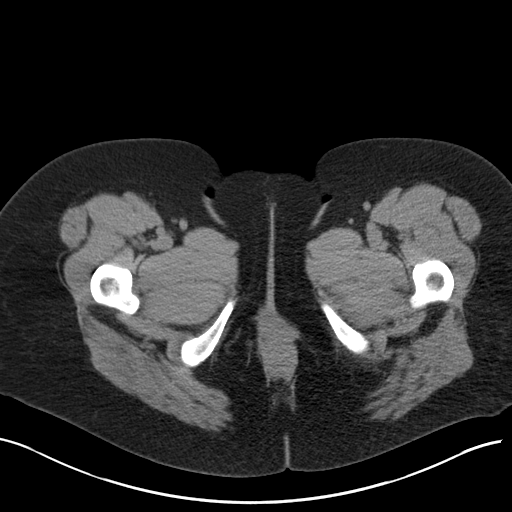
[im 6/98  bone]
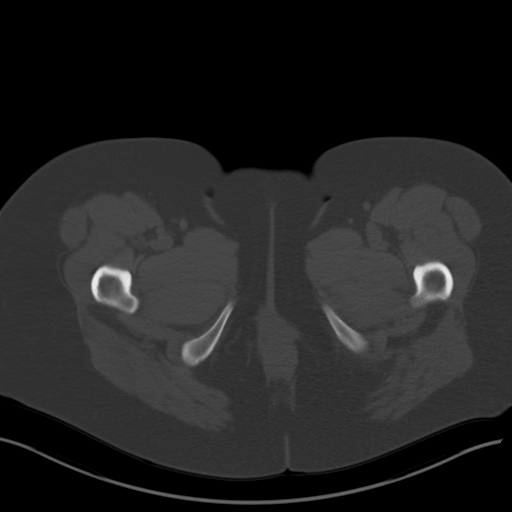
[im 18/98  soft-tissue]
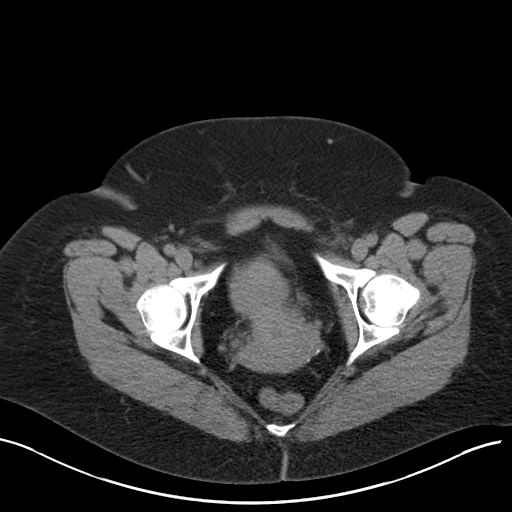
[im 23/98  soft-tissue]
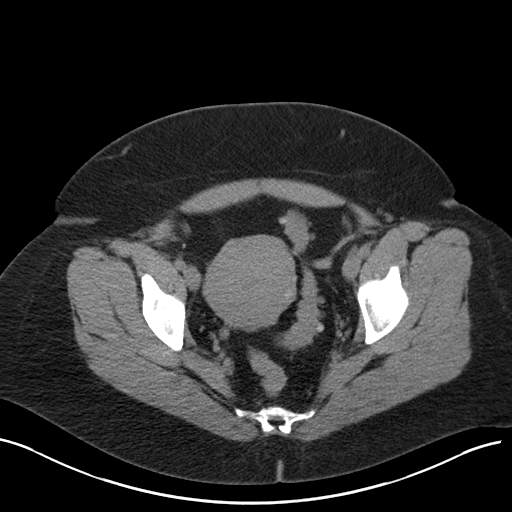
[im 29/98  soft-tissue]
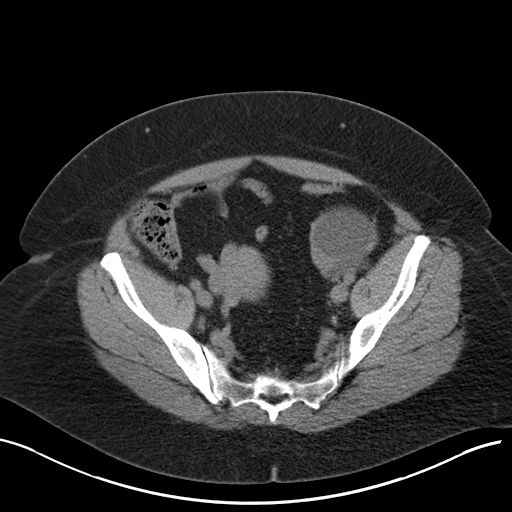
[im 40/98  soft-tissue]
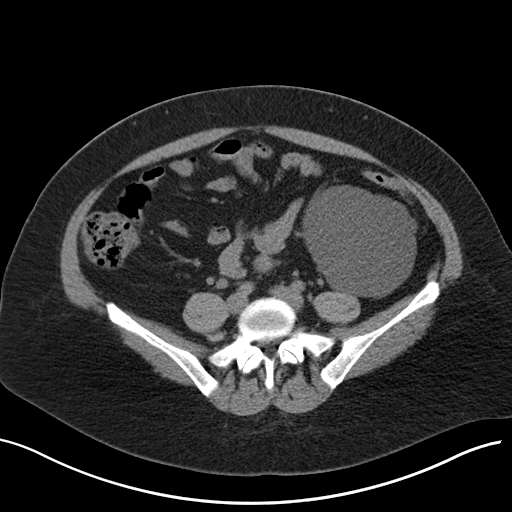
[im 46/98  soft-tissue]
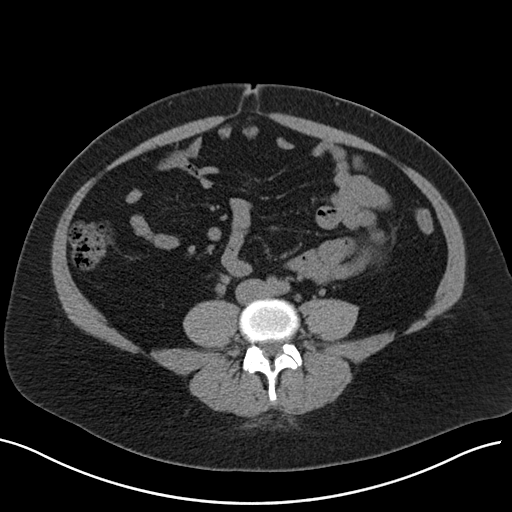
[im 52/98  soft-tissue]
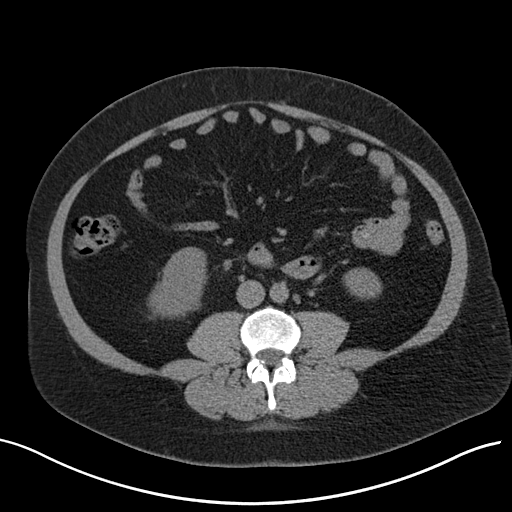
[im 63/98  soft-tissue]
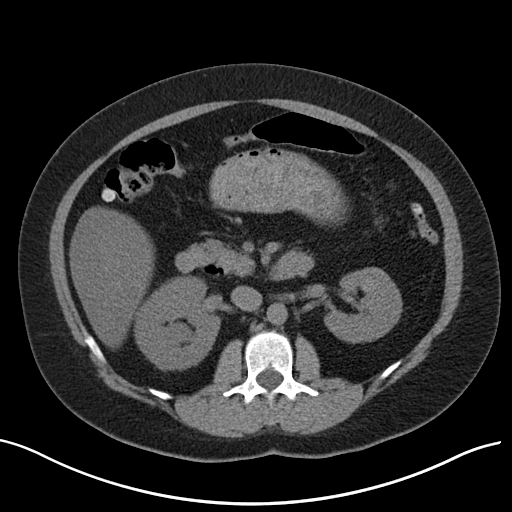
[im 69/98  soft-tissue]
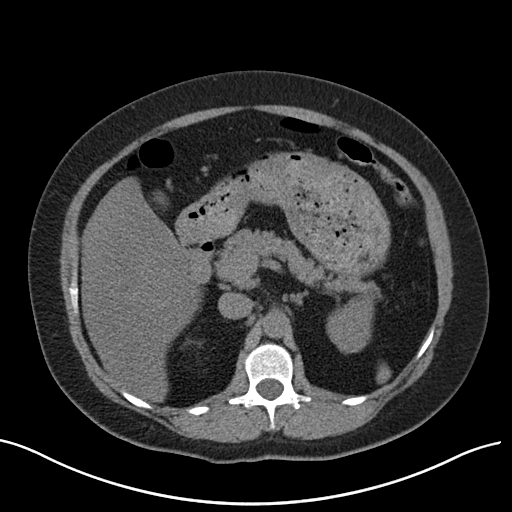
[im 69/98  bone]
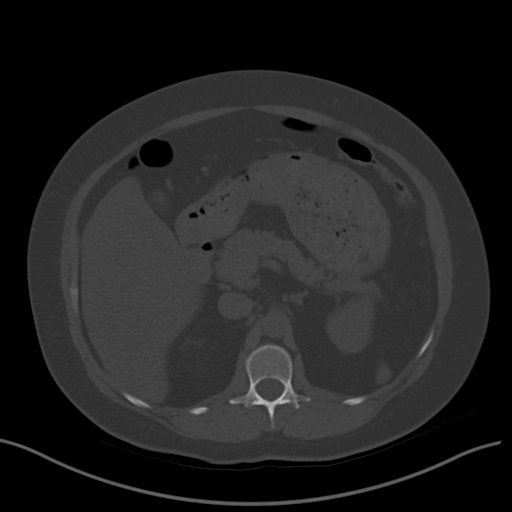
[im 75/98  soft-tissue]
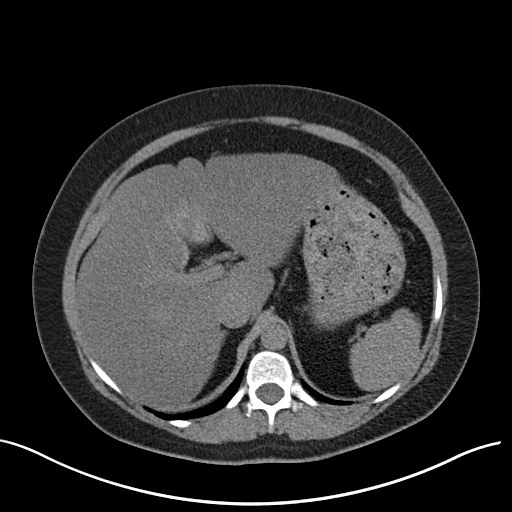
[im 86/98  soft-tissue]
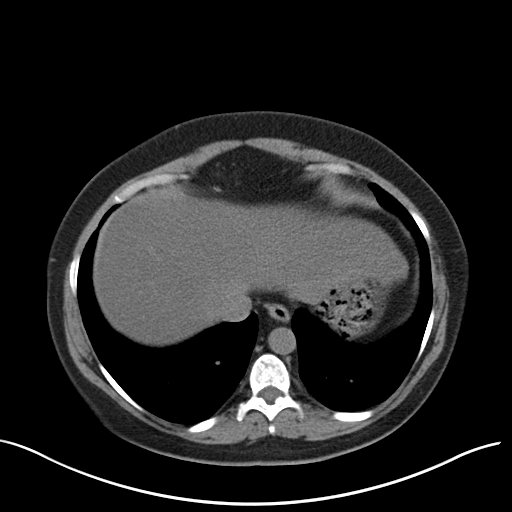
[im 92/98  soft-tissue]
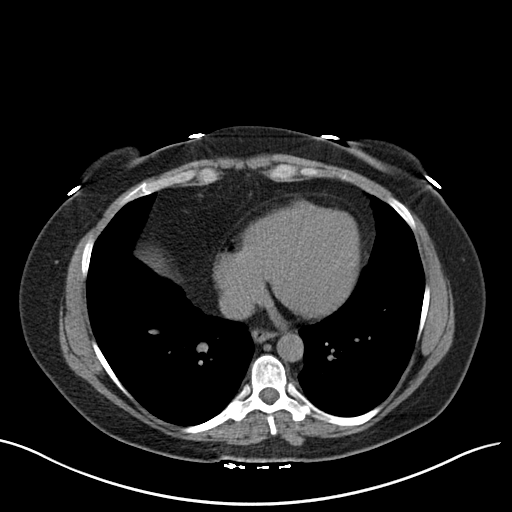

[Series 5: coronal · coronal · 0.88mm/px · 3 of 182 slices shown]
[im 61/182  soft-tissue]
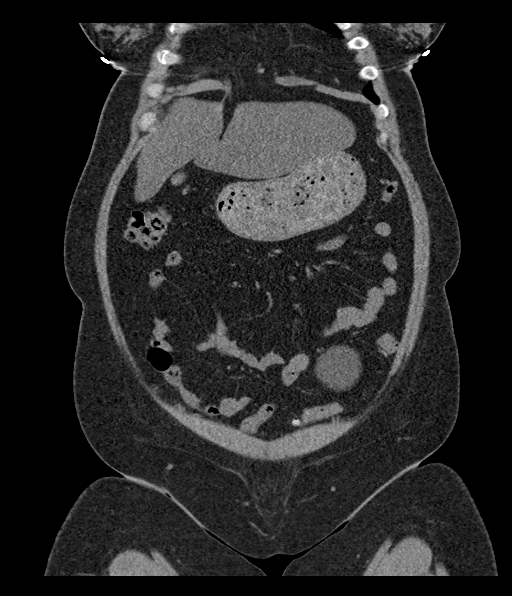
[im 81/182  soft-tissue]
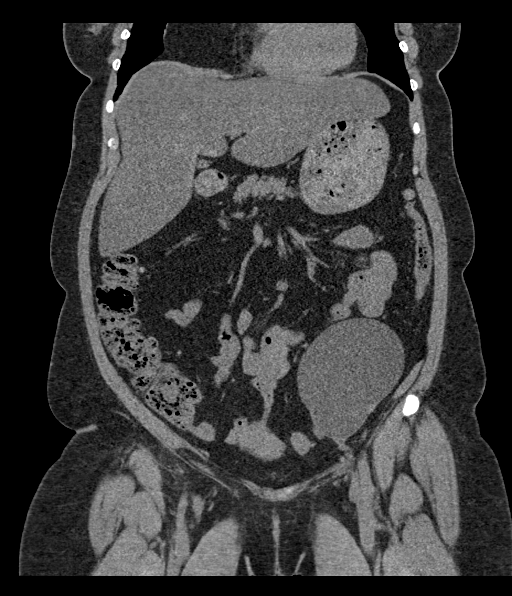
[im 101/182  soft-tissue]
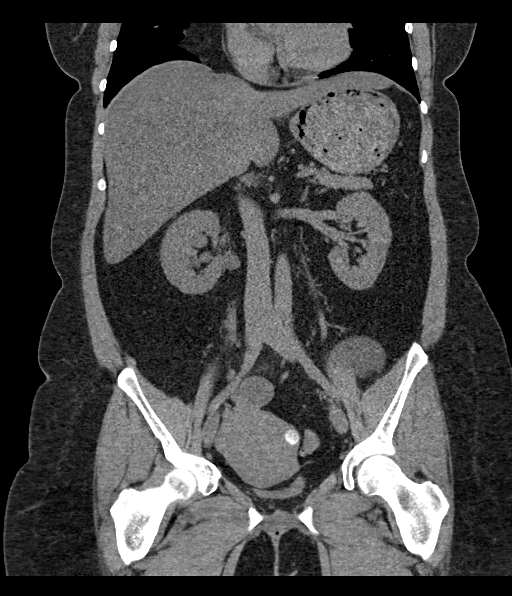

[15 of 46 positions shown; findings below may reference images not displayed]

FINDINGS: Evaluation of this exam is limited in the absence of intravenous
contrast.

Lower chest: The visualized lung bases are clear. There is a 5.5 cm
anterior diaphragmatic defect with herniation of abdominal fat into
the right hemithorax.

No intra-abdominal free air or free fluid.

Hepatobiliary: Fatty infiltration of the liver. No intrahepatic
biliary ductal dilatation. There is gallstone. No pericholecystic
fluid or evidence of acute cholecystitis by CT. Ultrasound may
provide better evaluation of the gallbladder.

Pancreas: Unremarkable. No pancreatic ductal dilatation or
surrounding inflammatory changes.

Spleen: There is irregularity of the inferior aspect of the spleen
which may be related to heterotopic splenic tissue or sequela prior
injury.

Adrenals/Urinary Tract: The adrenal glands unremarkable. There is a
3 mm stone in the distal right ureter/UVJ with mild right
hydronephrosis. No stone identified within the right kidney. The
left kidney is unremarkable. The urinary bladder is collapsed.

Stomach/Bowel: There is sigmoid diverticulosis and scattered colonic
diverticula without active inflammatory changes. There is no bowel
obstruction. The appendix is normal.

Vascular/Lymphatic: The abdominal aorta and IVC are unremarkable on
this noncontrast CT. No portal venous gas. There is no adenopathy.

Reproductive: The uterus is anteverted. There is a small left
posterior body calcified fibroid. A 3 cm dominant right ovarian
follicle. There is a septated left ovarian cystic lesion measuring
up to 11.5 cm in greatest length. This is concerning for low-grade
cystic neoplasm. Recommend GYN consult and consider pelvis MRI w/o
and w/ contrast if clinically warranted. Note: This recommendation
does not apply to premenarchal patients and to those with increased
risk (genetic, family history, elevated tumor markers or other
high-risk factors) of ovarian cancer. Reference: JACR [DATE]):[id number]

Other: Small fat containing umbilical hernia.

Musculoskeletal: No acute or significant osseous findings.
IMPRESSION: 1. A 3 mm distal right ureteral/UVJ stone with mild right
hydronephrosis.
2. Fatty liver.
3. Cholelithiasis.
4. Colonic diverticulosis. No bowel obstruction. Normal appendix.
5. Complex left ovarian cystic lesion concerning for low-grade
cystic neoplasm.

## 2020-06-07 MED ORDER — HYDROMORPHONE HCL 1 MG/ML IJ SOLN
1.0000 mg | Freq: Once | INTRAMUSCULAR | Status: AC
  Administered 2020-06-07: 1 mg via INTRAVENOUS
  Filled 2020-06-07: qty 1

## 2020-06-07 MED ORDER — ONDANSETRON 4 MG PO TBDP
4.0000 mg | ORAL_TABLET | Freq: Once | ORAL | Status: AC
  Administered 2020-06-07: 4 mg via ORAL
  Filled 2020-06-07: qty 1

## 2020-06-07 MED ORDER — OXYCODONE-ACETAMINOPHEN 5-325 MG PO TABS: 1.0000 | ORAL_TABLET | ORAL | 0 refills | Status: DC | PRN

## 2020-06-07 MED ORDER — TAMSULOSIN HCL 0.4 MG PO CAPS: 0.4000 mg | ORAL_CAPSULE | Freq: Every day | ORAL | 0 refills | Status: DC

## 2020-06-07 MED ORDER — OXYCODONE-ACETAMINOPHEN 5-325 MG PO TABS
1.0000 | ORAL_TABLET | Freq: Once | ORAL | Status: AC
Start: 2020-06-07 — End: 2020-06-07
  Administered 2020-06-07: 1 via ORAL
  Filled 2020-06-07: qty 1

## 2020-06-07 MED ORDER — KETOROLAC TROMETHAMINE 30 MG/ML IJ SOLN
15.0000 mg | Freq: Once | INTRAMUSCULAR | Status: AC
  Administered 2020-06-07: 15 mg via INTRAVENOUS
  Filled 2020-06-07: qty 1

## 2020-06-07 MED ORDER — ONDANSETRON 8 MG PO TBDP: 8.0000 mg | ORAL_TABLET | Freq: Three times a day (TID) | ORAL | 0 refills | Status: DC | PRN

## 2020-06-07 NOTE — ED Triage Notes (Signed)
Patient is complaining of right abdominal pain, right flank pain, and right lower back that started 3 hours ago. Patient has a hx of kidney stones. Patient states that is what it feels like.

## 2020-06-07 NOTE — ED Notes (Signed)
Pt verbalized understanding of d/c, medication, and follow up care. Ambulatory with steady gait.  

## 2020-06-07 NOTE — ED Notes (Signed)
Urine Culture sent down stairs

## 2020-06-07 NOTE — ED Provider Notes (Signed)
[facility] DEPT Provider Note   CSN: [id number] Arrival date & time: 06/07/20  1958     History Chief Complaint  Patient presents with  . Abdominal Pain    Diana Weaver is a 45 y.o. female.  45 year old female presents with several hours of right-sided flank pain characterizes colicky and similar to her prior renal stones.  No hematuria.  No nausea no vomiting no fever or chills.  No vaginal bleeding noted.  Symptoms persistent no treatment use prior to arrival        History reviewed. No pertinent past medical history.  Patient Active Problem List   Diagnosis Date Noted  . Obesity (BMI 30-39.9) 05/10/2018  . Premature contraction 05/10/2018    Past Surgical History:  Procedure Laterality Date  . epiglottic stenosis       OB History   No obstetric history on file.     Family History  Problem Relation Age of Onset  . Hypertension Mother   . Hyperlipidemia Mother   . Depression Father   . Hyperlipidemia Father   . Hypertension Father   . Breast cancer Maternal Grandfather        less than 50    Social History   Tobacco Use  . Smoking status: Former Research scientist (life sciences)  . Smokeless tobacco: Never Used  Vaping Use  . Vaping Use: Never used  Substance Use Topics  . Alcohol use: Yes    Comment: occasionally  . Drug use: Never    Home Medications Prior to Admission medications   Not on File    Allergies    Iodine and Latex  Review of Systems   Review of Systems  All other systems reviewed and are negative.   Physical Exam Updated Vital Signs BP (!) 152/105 (BP Location: Right Arm)   Pulse 87   Temp 98.2 F (36.8 C) (Oral)   Resp 18   Ht 1.626 m (5\' 4" )   Wt 89.8 kg   LMP 05/27/2020   SpO2 94%   BMI 33.99 kg/m   Physical Exam Vitals and nursing note reviewed.  Constitutional:      General: She is not in acute distress.    Appearance: Normal appearance. She is well-developed. She is not toxic-appearing.   HENT:     Head: Normocephalic and atraumatic.  Eyes:     General: Lids are normal.     Conjunctiva/sclera: Conjunctivae normal.     Pupils: Pupils are equal, round, and reactive to light.  Neck:     Thyroid: No thyroid mass.     Trachea: No tracheal deviation.  Cardiovascular:     Rate and Rhythm: Normal rate and regular rhythm.     Heart sounds: Normal heart sounds. No murmur heard. No gallop.   Pulmonary:     Effort: Pulmonary effort is normal. No respiratory distress.     Breath sounds: Normal breath sounds. No stridor. No decreased breath sounds, wheezing, rhonchi or rales.  Abdominal:     General: Bowel sounds are normal. There is no distension.     Palpations: Abdomen is soft.     Tenderness: There is no abdominal tenderness. There is no rebound.    Musculoskeletal:        General: No tenderness. Normal range of motion.     Cervical back: Normal range of motion and neck supple.  Skin:    General: Skin is warm and dry.     Findings: No abrasion or rash.  Neurological:  Mental Status: She is alert and oriented to person, place, and time.     GCS: GCS eye subscore is 4. GCS verbal subscore is 5. GCS motor subscore is 6.     Cranial Nerves: No cranial nerve deficit.     Sensory: No sensory deficit.  Psychiatric:        Speech: Speech normal.        Behavior: Behavior normal.     ED Results / Procedures / Treatments   Labs (all labs ordered are listed, but only abnormal results are displayed) Labs Reviewed  URINALYSIS, ROUTINE W REFLEX MICROSCOPIC    EKG None  Radiology CT Renal Stone Study  Result Date: 06/07/2020 CLINICAL DATA:  45 year old female with right flank pain. Concern for kidney stone. EXAM: CT ABDOMEN AND PELVIS WITHOUT CONTRAST TECHNIQUE: Multidetector CT imaging of the abdomen and pelvis was performed following the standard protocol without IV contrast. COMPARISON:  None. FINDINGS: Evaluation of this exam is limited in the absence of intravenous  contrast. Lower chest: The visualized lung bases are clear. There is a 5.5 cm anterior diaphragmatic defect with herniation of abdominal fat into the right hemithorax. No intra-abdominal free air or free fluid. Hepatobiliary: Fatty infiltration of the liver. No intrahepatic biliary ductal dilatation. There is gallstone. No pericholecystic fluid or evidence of acute cholecystitis by CT. Ultrasound may provide better evaluation of the gallbladder. Pancreas: Unremarkable. No pancreatic ductal dilatation or surrounding inflammatory changes. Spleen: There is irregularity of the inferior aspect of the spleen which may be related to heterotopic splenic tissue or sequela prior injury. Adrenals/Urinary Tract: The adrenal glands unremarkable. There is a 3 mm stone in the distal right ureter/UVJ with mild right hydronephrosis. No stone identified within the right kidney. The left kidney is unremarkable. The urinary bladder is collapsed. Stomach/Bowel: There is sigmoid diverticulosis and scattered colonic diverticula without active inflammatory changes. There is no bowel obstruction. The appendix is normal. Vascular/Lymphatic: The abdominal aorta and IVC are unremarkable on this noncontrast CT. No portal venous gas. There is no adenopathy. Reproductive: The uterus is anteverted. There is a small left posterior body calcified fibroid. A 3 cm dominant right ovarian follicle. There is a septated left ovarian cystic lesion measuring up to 11.5 cm in greatest length. This is concerning for low-grade cystic neoplasm. Recommend GYN consult and consider pelvis MRI w/o and w/ contrast if clinically warranted. Note: This recommendation does not apply to premenarchal patients and to those with increased risk (genetic, family history, elevated tumor markers or other high-risk factors) of ovarian cancer. Reference: JACR 2020 Feb; 17(2):248-254 Other: Small fat containing umbilical hernia. Musculoskeletal: No acute or significant osseous  findings. IMPRESSION: 1. A 3 mm distal right ureteral/UVJ stone with mild right hydronephrosis. 2. Fatty liver. 3. Cholelithiasis. 4. Colonic diverticulosis. No bowel obstruction. Normal appendix. 5. 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 Electronically Signed   By: Anner Crete M.D.   On: 06/07/2020 20:46    Procedures Procedures   Medications Ordered in ED Medications  HYDROmorphone (DILAUDID) injection 1 mg (has no administration in time range)  ketorolac (TORADOL) 30 MG/ML injection 15 mg (has no administration in time range)  oxyCODONE-acetaminophen (PERCOCET/ROXICET) 5-325 MG per tablet 1 tablet (1 tablet Oral Given 06/07/20 2039)  ondansetron (ZOFRAN-ODT) disintegrating tablet 4 mg (4 mg Oral Given 06/07/20 2034)    ED Course  I have reviewed the triage vital signs and the nursing notes.  Pertinent labs & imaging results that were available during  my care of the patient were reviewed by me and considered in my medical decision making (see chart for details).    MDM Rules/Calculators/A&P                          Patient with evidence of kidney stone on the right side on CT scan.  Also was found to have left ovarian cystic structure concerning for possible neoplasm.  Will inform patient of this and have her follow-up with her GYN doctor Final Clinical Impression(s) / ED Diagnoses Final diagnoses:  None    Rx / DC Orders ED Discharge Orders    None       Lacretia Leigh, MD 06/07/20 2209

## 2020-06-07 NOTE — ED Provider Notes (Signed)
Patient seen in the triage for MSE  Chief Complaint: Right flank and side pain x 3 hours,   HPI:   Hx of kidney stones, now with 3 hours of right flank pain radiating around to lower right abdomen. Urinated 2 hours ago, feels like she needs to urinate now but unable to pass any.   ROS: flank pain, decreased urine NEGATIVE for hematuria, dysuria, fevers, chills Physical Exam:   Gen: No distress  Neuro: Awake and Alert  Skin: Warm    Focused Exam: Right CVA tenderness, RLQ TTP, no suprapubic TTP. Normal cardiopulmonary exam.  Initiation of care has begun. The patient has been counseled on the process, plan, and necessity for staying for the completion/evaluation, and the remainder of the medical screening examination  MSE was initiated and I personally evaluated the patient and placed orders (if any) at  8:21 PM on June 07, 2020.  The patient appears stable so that the remainder of the MSE may be completed by another provider.   Aura Dials 06/07/20 2023    Lacretia Leigh, MD 06/10/20 [phone]

## 2020-06-10 ENCOUNTER — Emergency Department (HOSPITAL_COMMUNITY): Payer: Federal, State, Local not specified - PPO

## 2020-06-10 ENCOUNTER — Emergency Department (HOSPITAL_COMMUNITY)
Admission: EM | Admit: 2020-06-10 | Discharge: 2020-06-10 | Disposition: A | Discharge status: Home / Self Care | Payer: Federal, State, Local not specified - PPO | Attending: Emergency Medicine | Admitting: Emergency Medicine

## 2020-06-10 ENCOUNTER — Other Ambulatory Visit: Payer: Self-pay

## 2020-06-10 ENCOUNTER — Encounter (HOSPITAL_COMMUNITY): Payer: Self-pay

## 2020-06-10 DIAGNOSIS — R8271 Bacteriuria: Secondary | ICD-10-CM

## 2020-06-10 DIAGNOSIS — N390 Urinary tract infection, site not specified: Secondary | ICD-10-CM | POA: Insufficient documentation

## 2020-06-10 DIAGNOSIS — K449 Diaphragmatic hernia without obstruction or gangrene: Secondary | ICD-10-CM

## 2020-06-10 DIAGNOSIS — N179 Acute kidney failure, unspecified: Secondary | ICD-10-CM | POA: Diagnosis not present

## 2020-06-10 DIAGNOSIS — N201 Calculus of ureter: Secondary | ICD-10-CM | POA: Diagnosis not present

## 2020-06-10 DIAGNOSIS — R0789 Other chest pain: Secondary | ICD-10-CM | POA: Diagnosis not present

## 2020-06-10 DIAGNOSIS — R1011 Right upper quadrant pain: Secondary | ICD-10-CM | POA: Diagnosis not present

## 2020-06-10 DIAGNOSIS — R0602 Shortness of breath: Secondary | ICD-10-CM | POA: Insufficient documentation

## 2020-06-10 DIAGNOSIS — I2699 Other pulmonary embolism without acute cor pulmonale: Secondary | ICD-10-CM | POA: Diagnosis not present

## 2020-06-10 DIAGNOSIS — K8020 Calculus of gallbladder without cholecystitis without obstruction: Secondary | ICD-10-CM | POA: Insufficient documentation

## 2020-06-10 DIAGNOSIS — R109 Unspecified abdominal pain: Secondary | ICD-10-CM | POA: Diagnosis not present

## 2020-06-10 DIAGNOSIS — N83202 Unspecified ovarian cyst, left side: Secondary | ICD-10-CM

## 2020-06-10 DIAGNOSIS — J9811 Atelectasis: Secondary | ICD-10-CM | POA: Diagnosis not present

## 2020-06-10 DIAGNOSIS — R55 Syncope and collapse: Secondary | ICD-10-CM | POA: Diagnosis not present

## 2020-06-10 DIAGNOSIS — K760 Fatty (change of) liver, not elsewhere classified: Secondary | ICD-10-CM | POA: Diagnosis not present

## 2020-06-10 DIAGNOSIS — R071 Chest pain on breathing: Secondary | ICD-10-CM | POA: Insufficient documentation

## 2020-06-10 HISTORY — DX: Disorder of kidney and ureter, unspecified: N28.9

## 2020-06-10 LAB — LIPASE, BLOOD: Lipase: 21 U/L (ref 11–51)

## 2020-06-10 LAB — HEPATIC FUNCTION PANEL
ALT: 13 U/L (ref 0–44)
AST: 13 U/L — ABNORMAL LOW (ref 15–41)
Albumin: 3.5 g/dL (ref 3.5–5.0)
Alkaline Phosphatase: 63 U/L (ref 38–126)
Bilirubin, Direct: 0.1 mg/dL (ref 0.0–0.2)
Indirect Bilirubin: 0.4 mg/dL (ref 0.3–0.9)
Total Bilirubin: 0.5 mg/dL (ref 0.3–1.2)
Total Protein: 7.1 g/dL (ref 6.5–8.1)

## 2020-06-10 LAB — I-STAT BETA HCG BLOOD, ED (MC, WL, AP ONLY): I-stat hCG, quantitative: <5 m[IU]/mL (ref <5)

## 2020-06-10 LAB — CBC
HCT: 38.5 % (ref 36.0–46.0)
Hemoglobin: 12.5 g/dL (ref 12.0–15.0)
MCH: 28.9 pg (ref 26.0–34.0)
MCHC: 32.5 g/dL (ref 30.0–36.0)
MCV: 89.1 fL (ref 80.0–100.0)
Platelets: 283 10*3/uL (ref 150–400)
RBC: 4.32 MIL/uL (ref 3.87–5.11)
RDW: 13.3 % (ref 11.5–15.5)
WBC: 16.1 10*3/uL — ABNORMAL HIGH (ref 4.0–10.5)
nRBC: 0 % (ref 0.0–0.2)

## 2020-06-10 LAB — BASIC METABOLIC PANEL
Anion gap: 7 (ref 5–15)
BUN: 17 mg/dL (ref 6–20)
CO2: 23 mmol/L (ref 22–32)
Calcium: 8.7 mg/dL — ABNORMAL LOW (ref 8.9–10.3)
Chloride: 108 mmol/L (ref 98–111)
Creatinine, Ser: 1.39 mg/dL — ABNORMAL HIGH (ref 0.44–1.00)
GFR, Estimated: 48 mL/min — ABNORMAL LOW (ref >60)
Glucose, Bld: 115 mg/dL — ABNORMAL HIGH (ref 70–99)
Potassium: 4.1 mmol/L (ref 3.5–5.1)
Sodium: 138 mmol/L (ref 135–145)

## 2020-06-10 LAB — URINALYSIS, ROUTINE W REFLEX MICROSCOPIC
Bilirubin Urine: NEGATIVE
Glucose, UA: NEGATIVE mg/dL
Ketones, ur: 5 mg/dL — AB
Nitrite: NEGATIVE
Protein, ur: NEGATIVE mg/dL
Specific Gravity, Urine: 1.019 (ref 1.005–1.030)
pH: 5 (ref 5.0–8.0)

## 2020-06-10 LAB — CBG MONITORING, ED: Glucose-Capillary: 98 mg/dL (ref 70–99)

## 2020-06-10 LAB — D-DIMER, QUANTITATIVE: D-Dimer, Quant: 0.72 ug/mL-FEU — ABNORMAL HIGH (ref 0.00–0.50)

## 2020-06-10 IMAGING — CT CT ANGIO CHEST
3 of 7 series · 18 of 36 positions shown · IV contrast (omnipaque)
Comparison: None.

CLINICAL DATA: 45-year-old female with suspected pulmonary
embolism.

EXAM:
CT ANGIOGRAPHY CHEST WITH CONTRAST
TECHNIQUE: Multidetector CT imaging of the chest was performed using the
standard protocol during bolus administration of intravenous
contrast. Multiplanar CT image reconstructions and MIPs were
obtained to evaluate the vascular anatomy.
CONTRAST:  Fifty-eight mL Omnipaque 350, intravenous

[Series 5: thins · axial · 0.72mm/px · z∈[+24,+258]mm · 13 of 274 slices shown]
[im 20/274  lung]
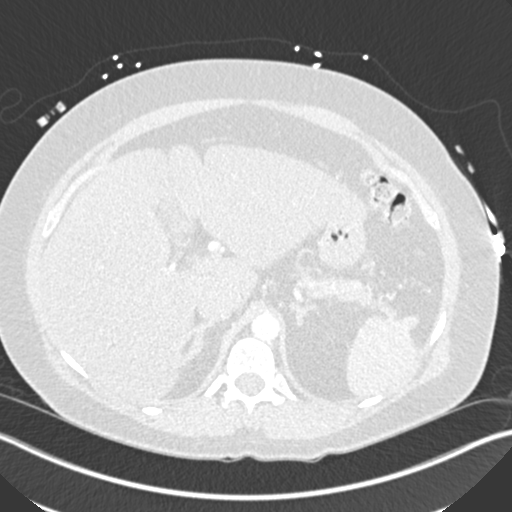
[im 40/274  mediastinal]
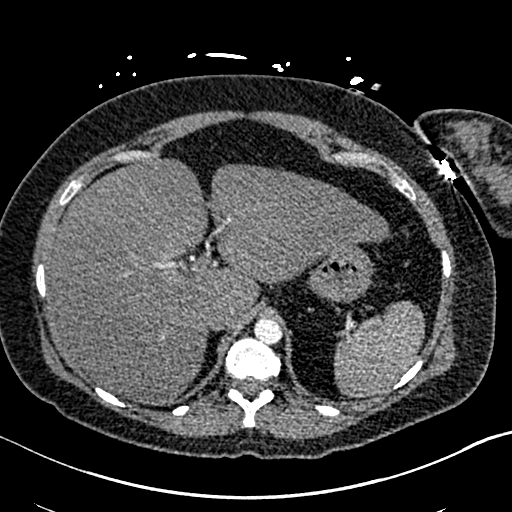
[im 59/274  lung]
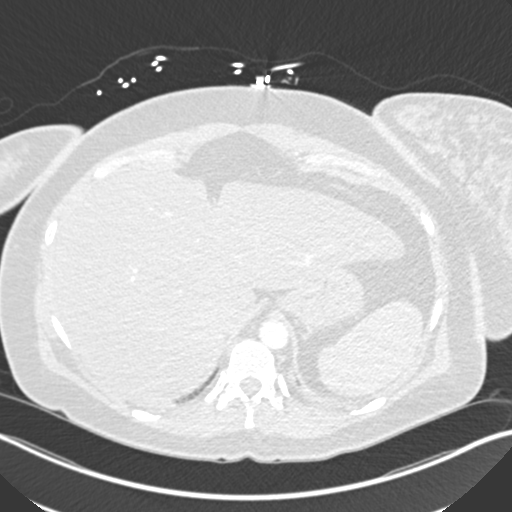
[im 79/274  mediastinal]
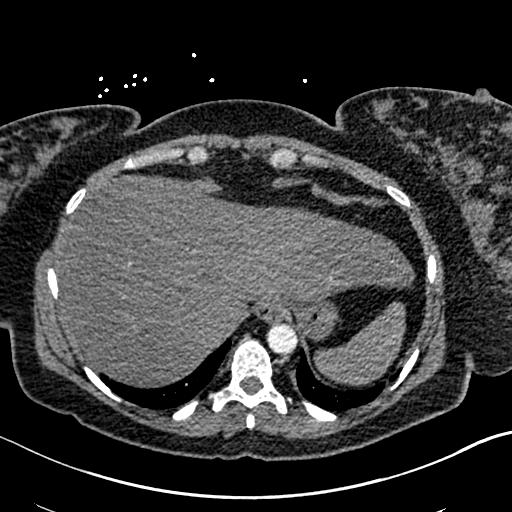
[im 98/274  lung]
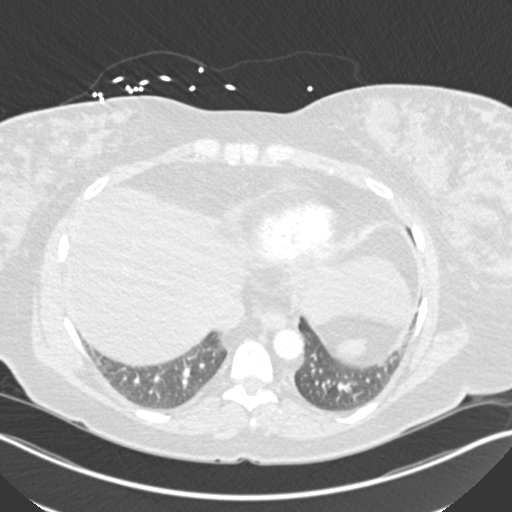
[im 118/274  mediastinal]
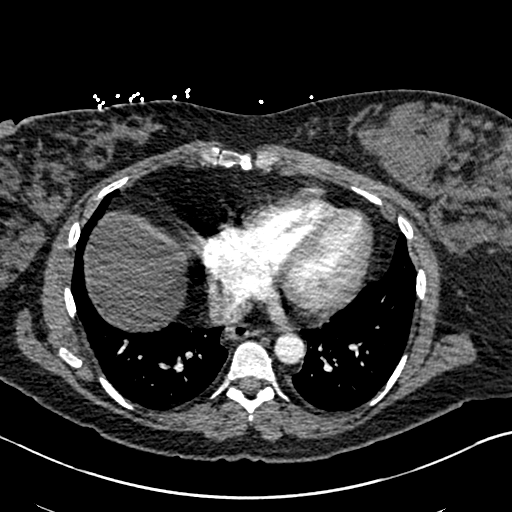
[im 137/274  lung]
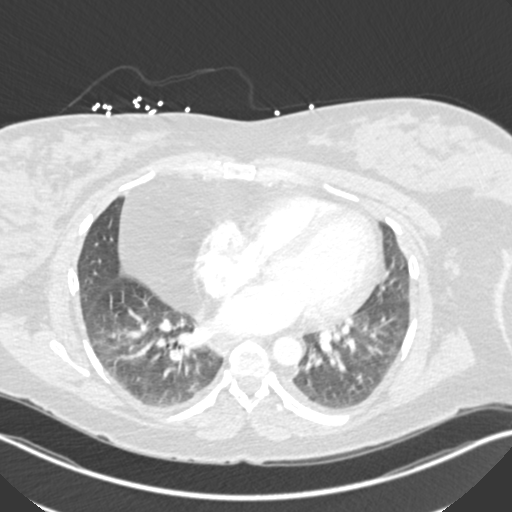
[im 157/274  mediastinal]
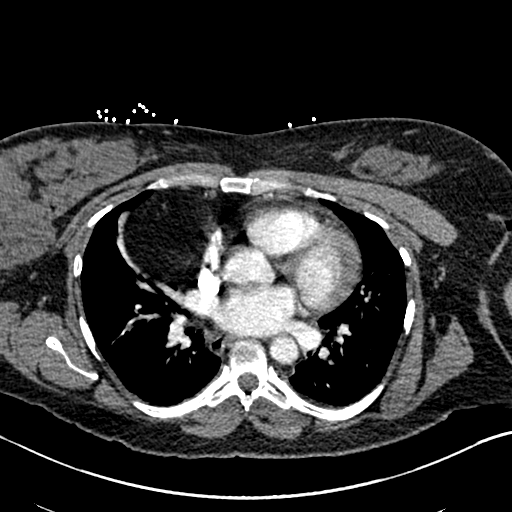
[im 176/274  lung]
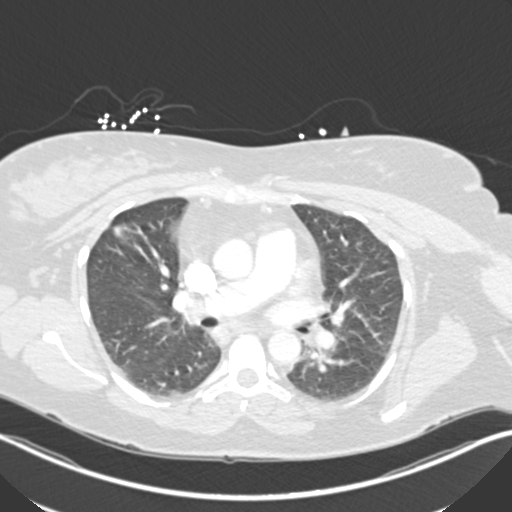
[im 196/274  mediastinal]
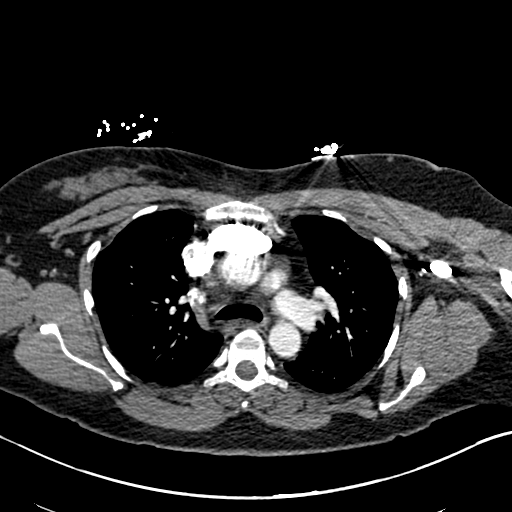
[im 215/274  lung]
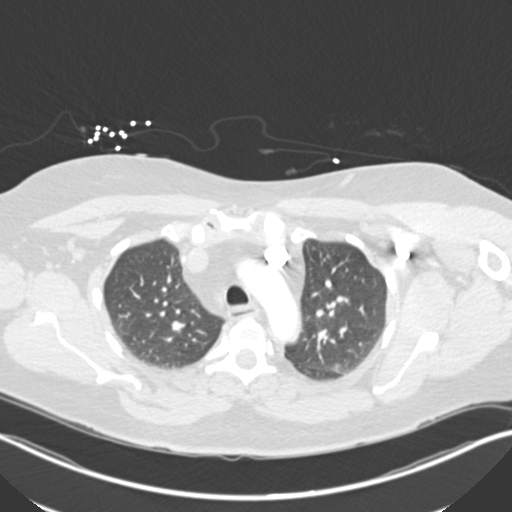
[im 235/274  mediastinal]
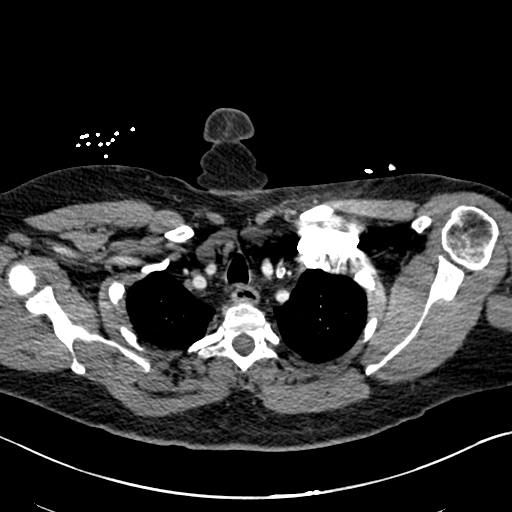
[im 254/274  lung]
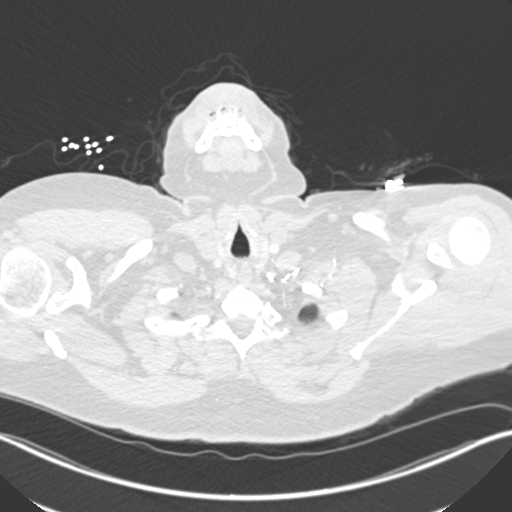

[Series 6: coronal mpr · coronal · 0.54mm/px · 1 of 136 slices shown]
[im 68/136  mediastinal]
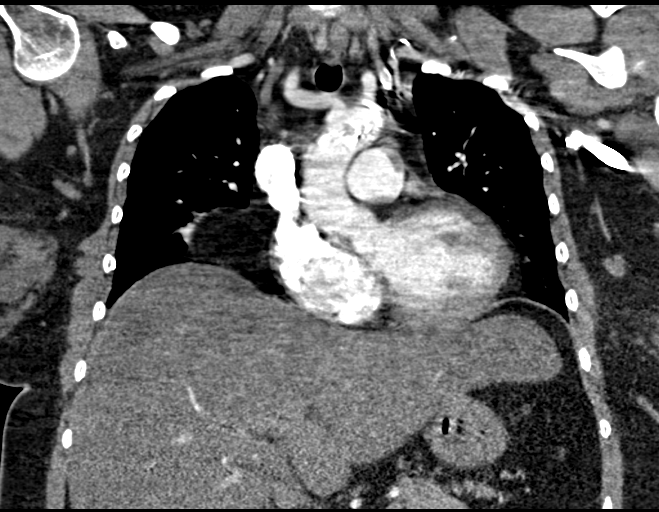

[Series 10: lung · axial · 0.77mm/px · z∈[+94,+226]mm · 4 of 111 slices shown]
[im 23/111  mediastinal]
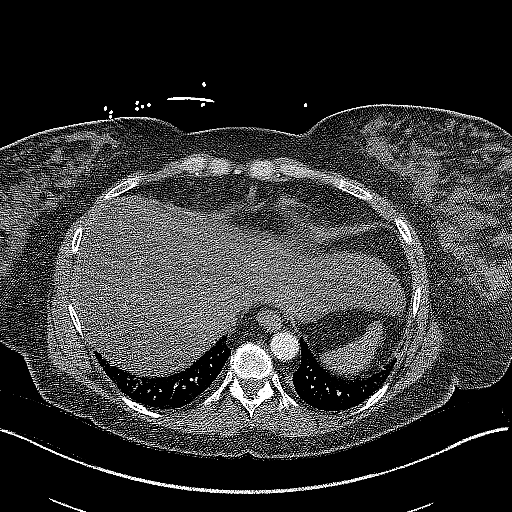
[im 45/111  mediastinal]
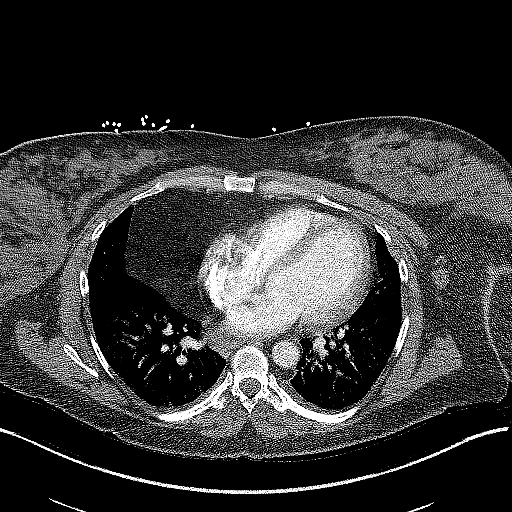
[im 67/111  mediastinal]
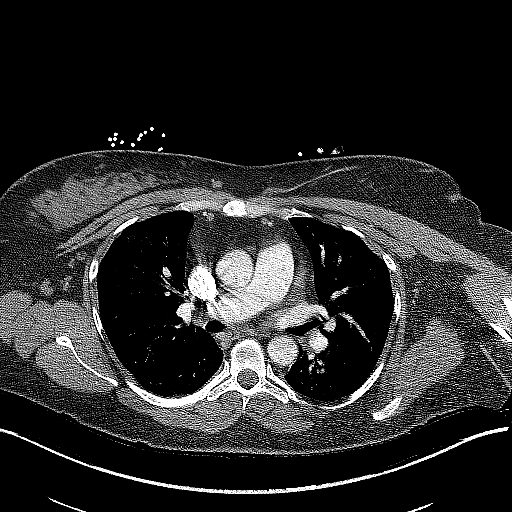
[im 89/111  mediastinal]
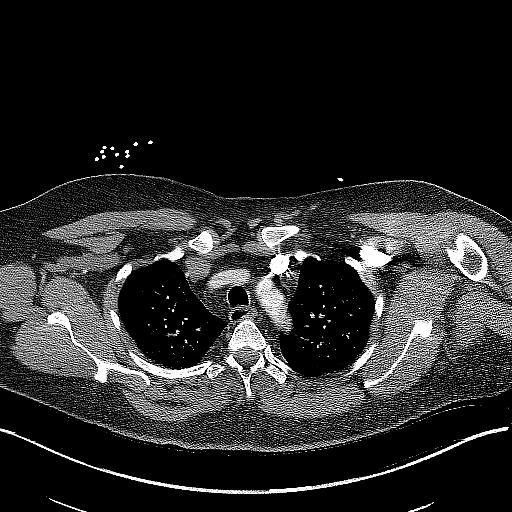

[18 of 36 positions shown; findings below may reference images not displayed]

FINDINGS: Cardiovascular: Satisfactory opacification of the pulmonary arteries
to the segmental level. No evidence of pulmonary embolism. Normal
heart size. No pericardial effusion.

Mediastinum/Nodes: No enlarged mediastinal, hilar, or axillary lymph
nodes. Thyroid gland, trachea, and esophagus demonstrate no
significant findings.

Lungs/Pleura: No focal consolidations. Mild lingular and right
middle lobe is linear atelectasis. No suspicious pulmonary nodules.
No pleural effusion or pneumothorax.

Upper Abdomen: Severe hepatic steatosis. 1.9 cm peripherally
calcified gallstone in the infundibulum. The remaining visualized
upper abdomen is within normal limits.

Musculoskeletal: No chest wall abnormality. No acute or significant
osseous findings.

Review of the MIP images confirms the above findings.
IMPRESSION: Vascular:

No evidence of pulmonary embolism.

Non-Vascular:

1. No acute thoracic abnormality.
2. Severe hepatic steatosis.
3. Cholelithiasis without evidence of cholecystitis, though the
gallbladder is incompletely evaluated.

## 2020-06-10 IMAGING — US US ABDOMEN LIMITED RUQ/ASCITES
1 series · 14 of 25 positions shown · non-contrast
Comparison: CT abdomen pelvis dated [DATE].

CLINICAL DATA: 45-year-old female with back pain.

EXAM:
ULTRASOUND ABDOMEN LIMITED RIGHT UPPER QUADRANT

[Series 1: us abdomen limited ruq/ascites · 14 of 33 slices shown]
[im 1/33]
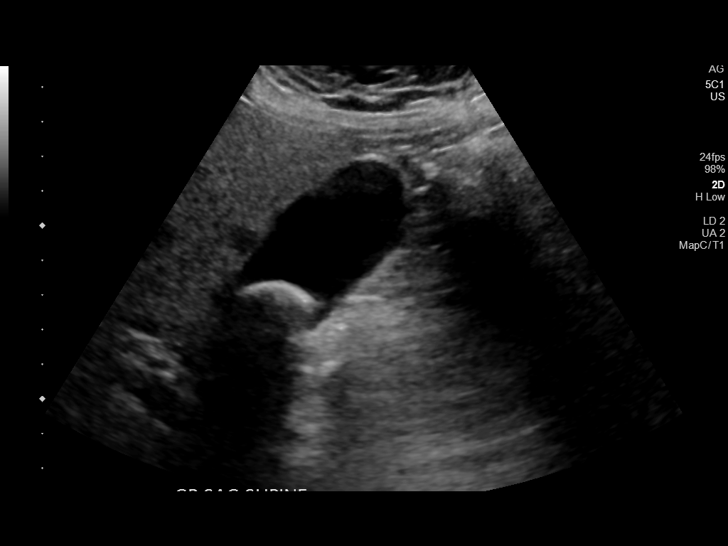
[im 3/33]
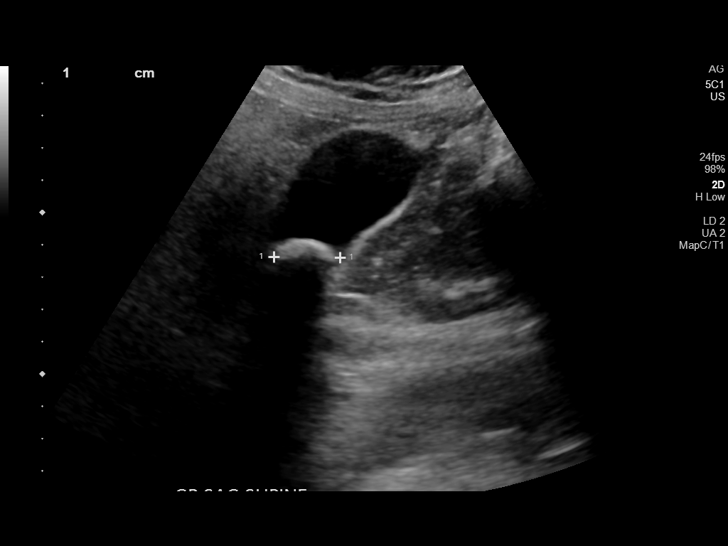
[im 6/33]
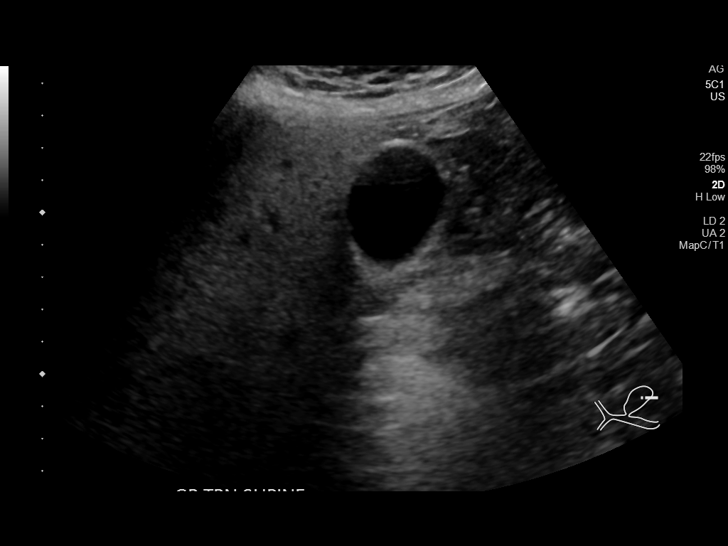
[im 9/33]
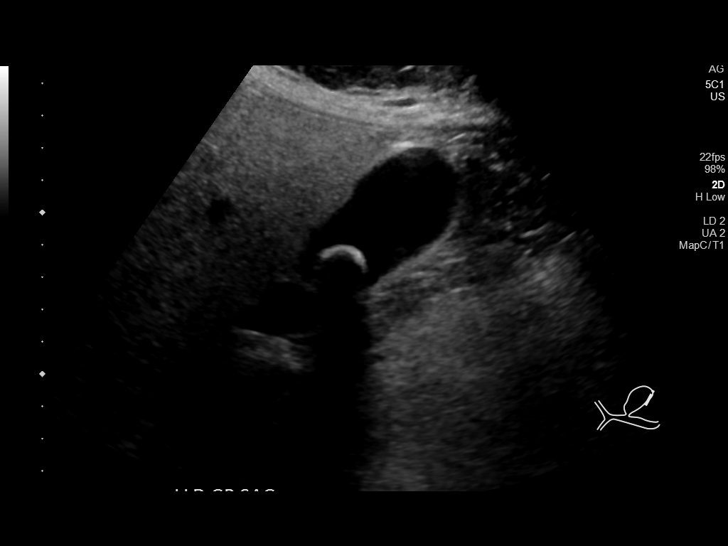
[im 11/33]
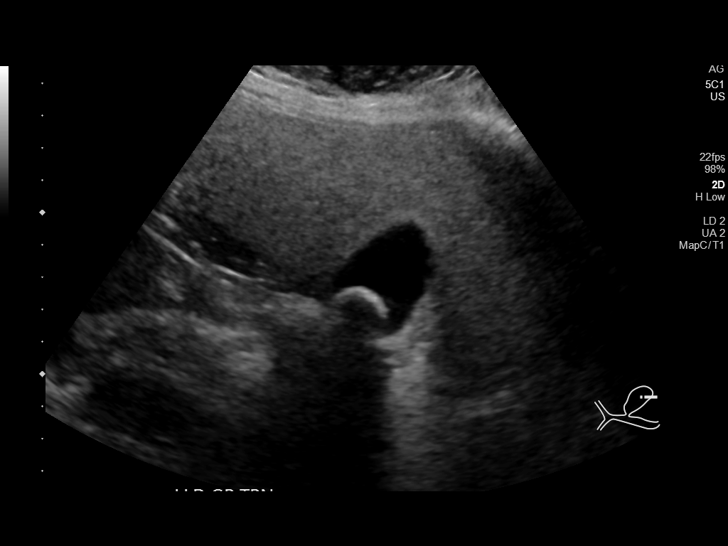
[im 13/33]
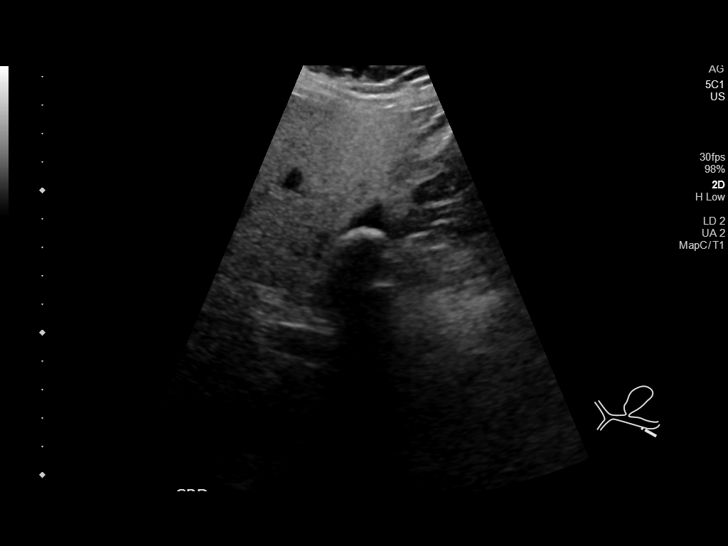
[im 15/33]
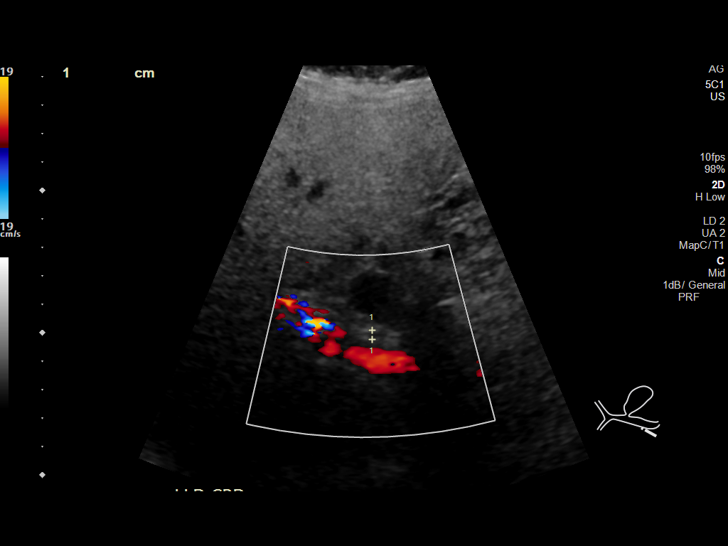
[im 18/33]
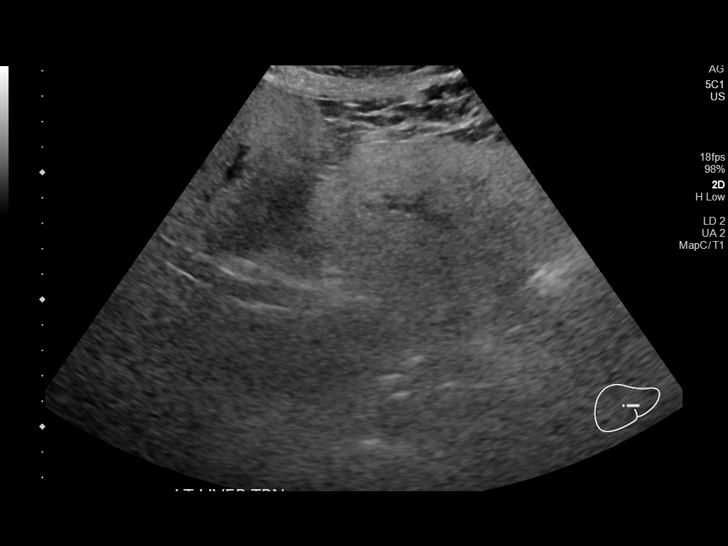
[im 21/33]
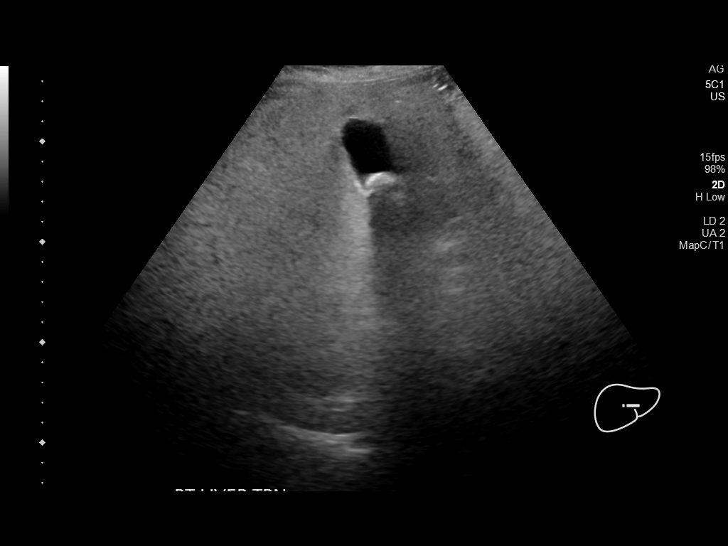
[im 22/33]
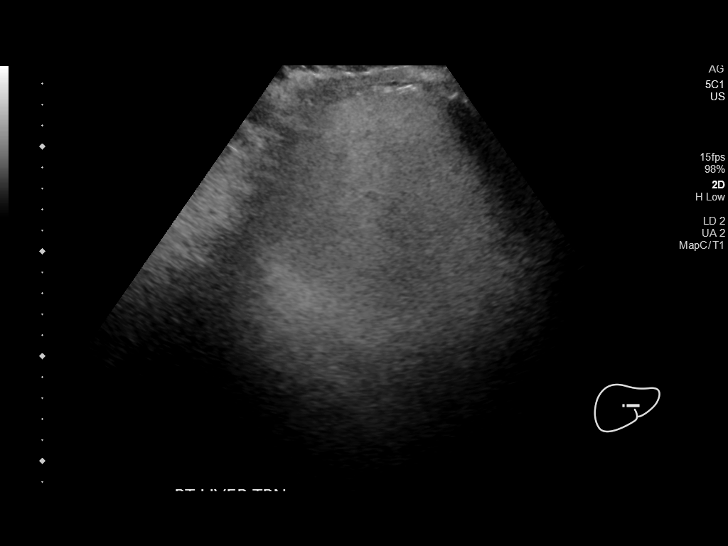
[im 25/33]
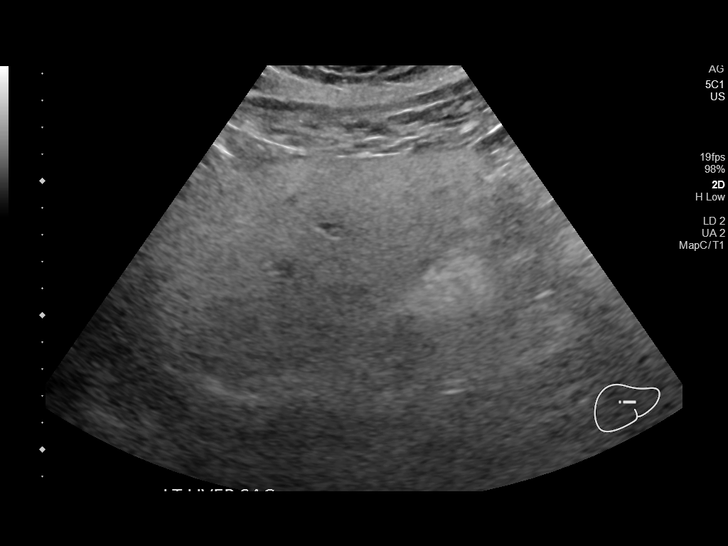
[im 27/33]
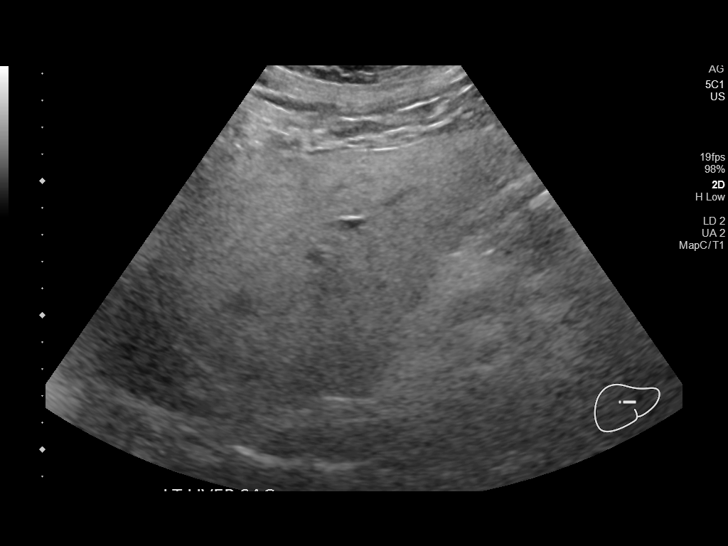
[im 30/33]
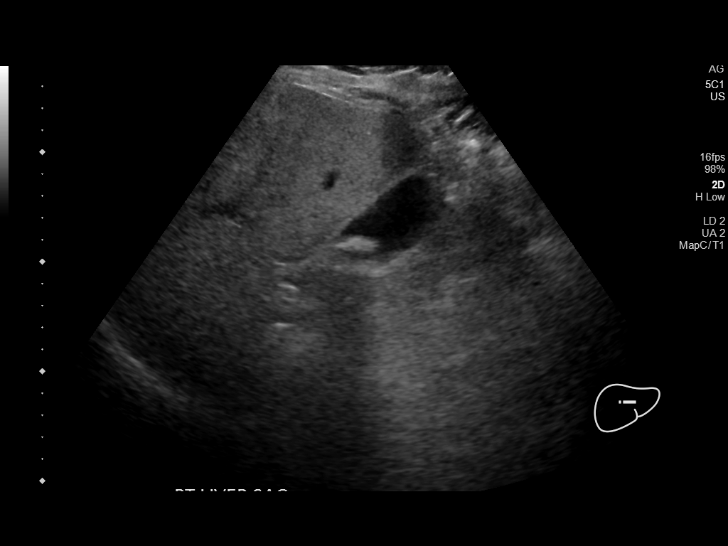
[im 33/33]
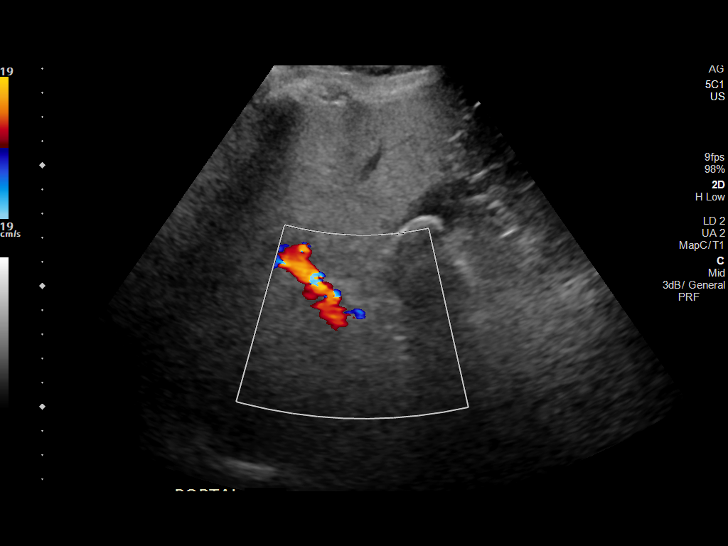

[14 of 25 positions shown; findings below may reference images not displayed]

FINDINGS: Gallbladder:

There is a 2 cm stone in the gallbladder. No gallbladder wall
thickening or pericholecystic fluid. Negative sonographic Murphy's
sign.

Common bile duct:

Diameter: 3 mm

Liver:

There is diffuse increased liver echogenicity most commonly seen in
the setting of fatty infiltration. Superimposed inflammation or
fibrosis is not excluded. Clinical correlation is recommended.
Portal vein is patent on color Doppler imaging with normal direction
of blood flow towards the liver.

Other: None.
IMPRESSION: 1. Cholelithiasis without sonographic evidence of acute
cholecystitis.
2. Fatty liver.

## 2020-06-10 IMAGING — CR DG CHEST 2V
2 series · 2 of 2 positions shown · non-contrast
Comparison: CT Abdomen and Pelvis [DATE].

CLINICAL DATA: 45-year-old female with shortness of breath, chest
and abdominal pain.

EXAM:
CHEST - 2 VIEW

[w chest pa]
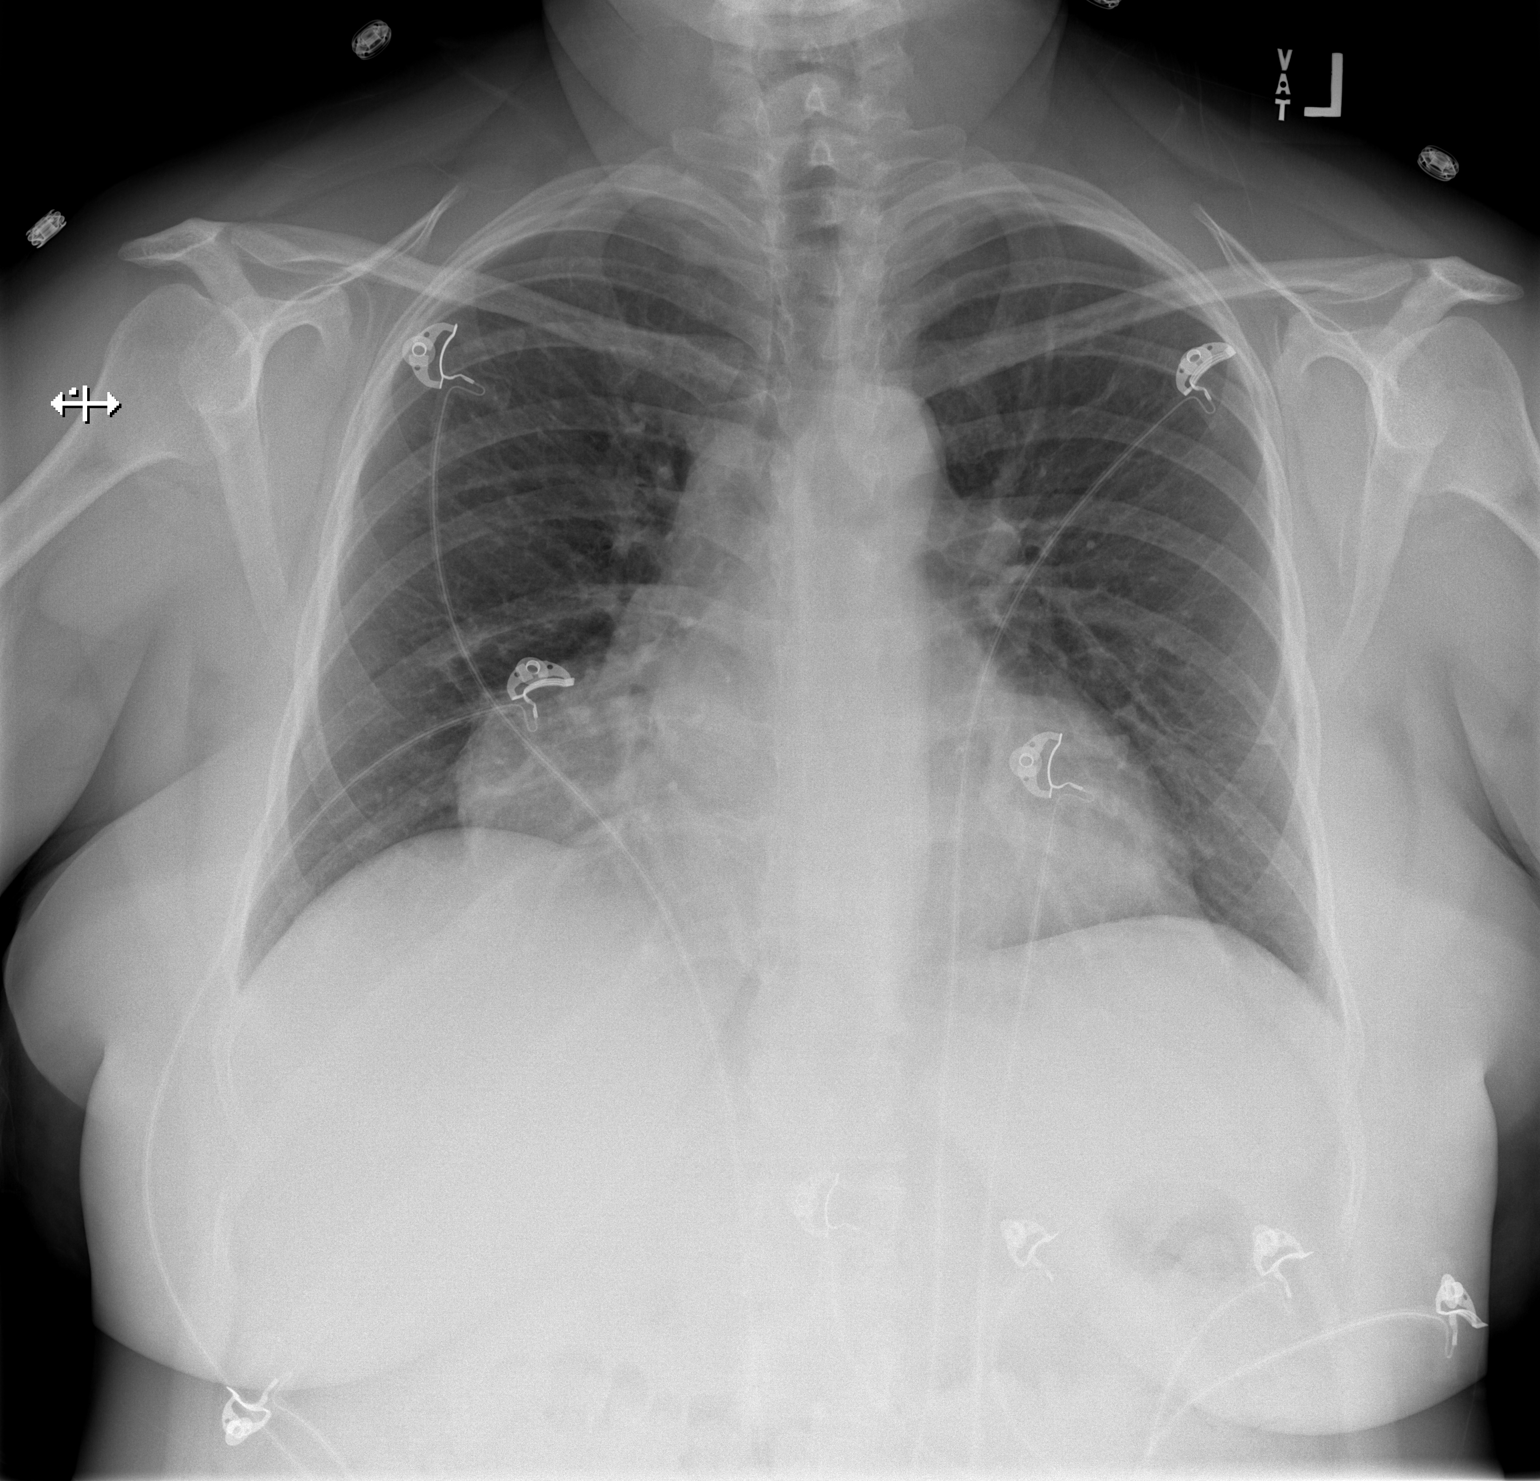

[w chest lat]
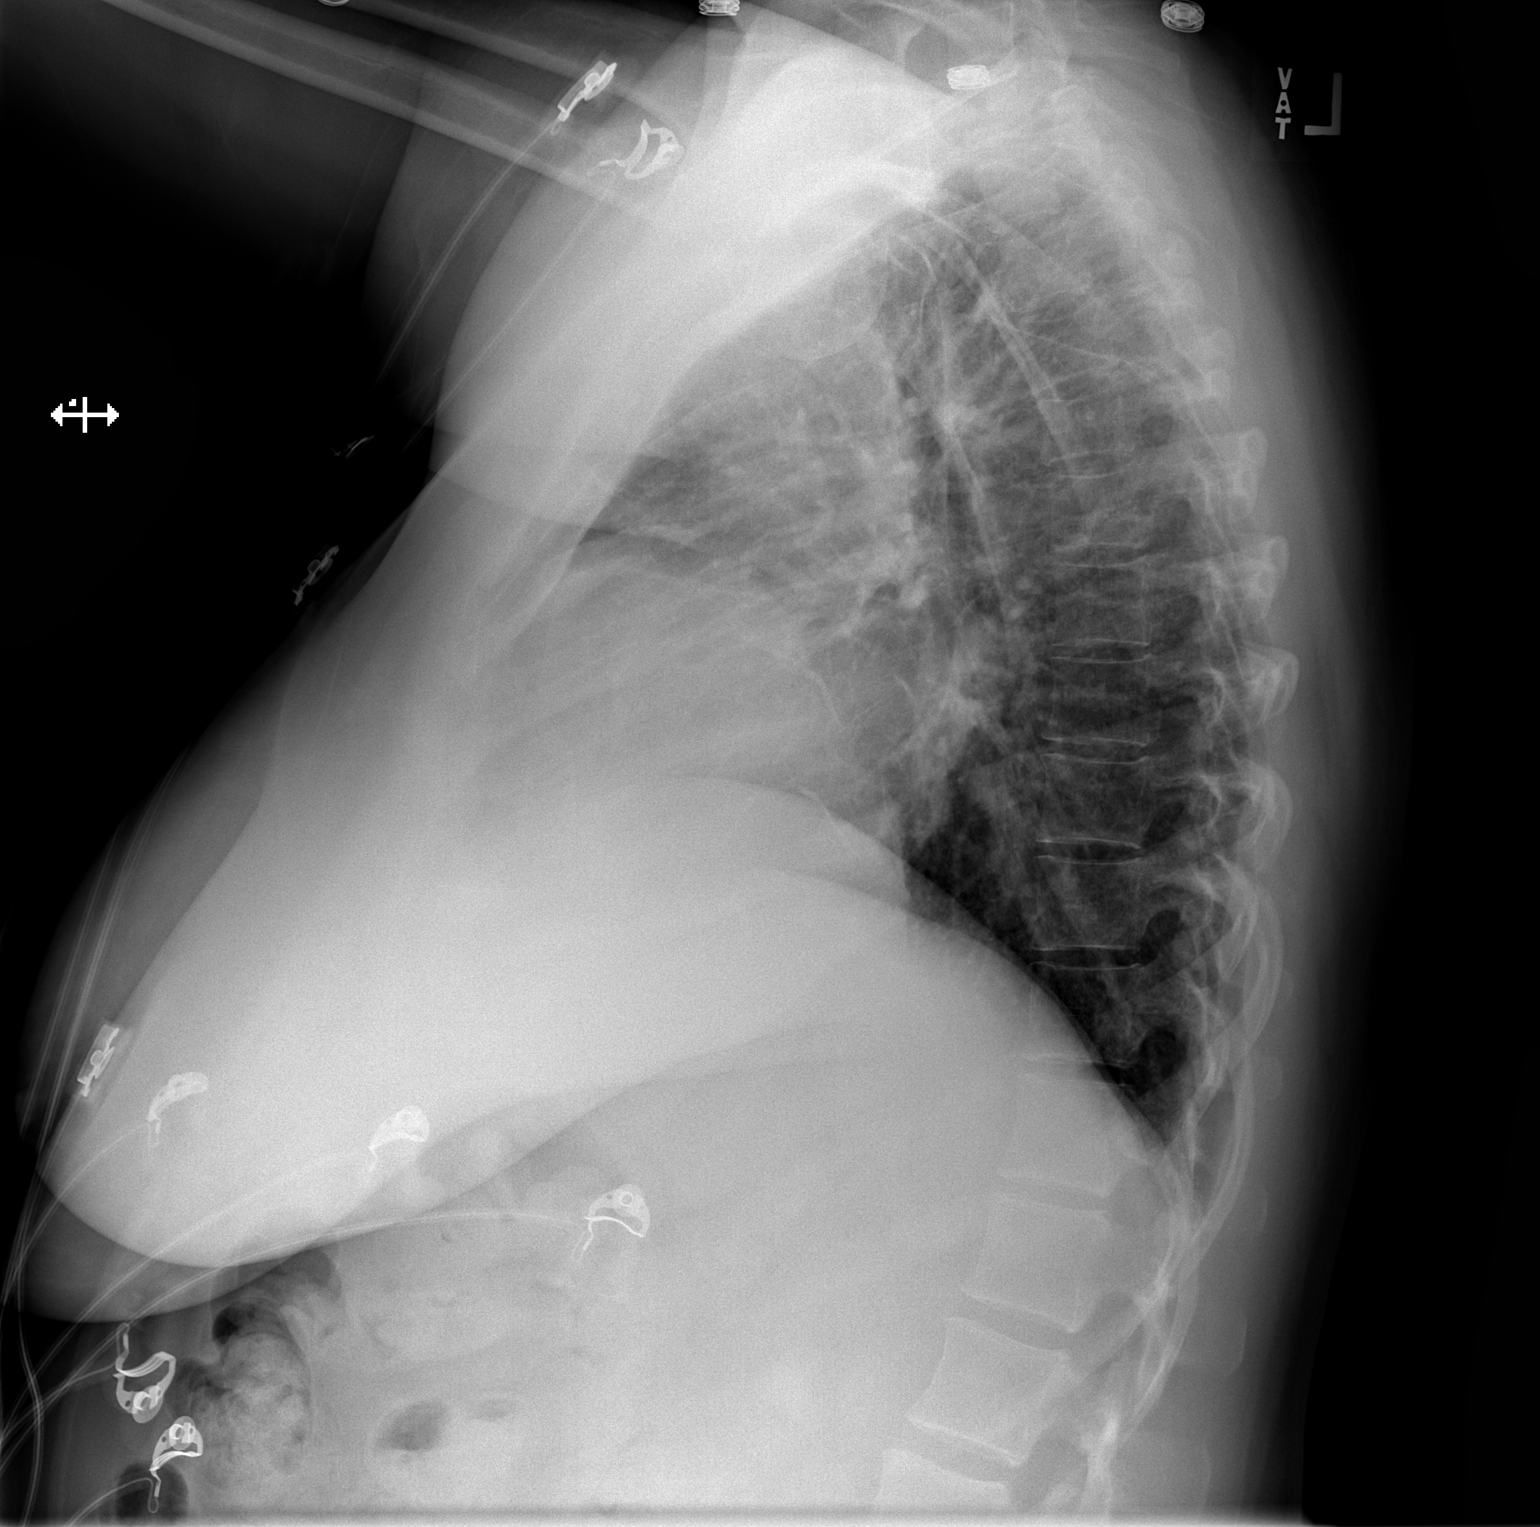

[2 of 2 positions shown; findings below may reference images not displayed]

FINDINGS: Mediastinal contours reflecting prominent right cardiophrenic angle
epicardial fat - fat containing hernia as seen on the recent CT.

No cardiomegaly. Other mediastinal contours are within normal
limits. Visualized tracheal air column is within normal limits.
Somewhat low lung volumes but no pneumothorax, pulmonary edema,
pleural effusion or confluent pulmonary opacity. Negative visible
bowel gas pattern. No acute osseous abnormality identified.
IMPRESSION: No acute cardiopulmonary abnormality. Right cardiophrenic angle fat
containing hernia.

## 2020-06-10 MED ORDER — MORPHINE SULFATE (PF) 4 MG/ML IV SOLN
4.0000 mg | Freq: Once | INTRAVENOUS | Status: AC
  Administered 2020-06-10: 4 mg via INTRAVENOUS
  Filled 2020-06-10: qty 1

## 2020-06-10 MED ORDER — SODIUM CHLORIDE 0.9 % IV BOLUS
1000.0000 mL | Freq: Once | INTRAVENOUS | Status: AC
  Administered 2020-06-10: 1000 mL via INTRAVENOUS

## 2020-06-10 MED ORDER — FENTANYL CITRATE (PF) 100 MCG/2ML IJ SOLN
25.0000 ug | Freq: Once | INTRAMUSCULAR | Status: AC
  Administered 2020-06-10: 25 ug via INTRAVENOUS
  Filled 2020-06-10: qty 2

## 2020-06-10 MED ORDER — IOHEXOL 350 MG/ML SOLN
100.0000 mL | Freq: Once | INTRAVENOUS | Status: AC | PRN
  Administered 2020-06-10: 58 mL via INTRAVENOUS

## 2020-06-10 MED ORDER — SODIUM CHLORIDE 0.9 % IV BOLUS
500.0000 mL | Freq: Once | INTRAVENOUS | Status: AC
  Administered 2020-06-10: 500 mL via INTRAVENOUS

## 2020-06-10 MED ORDER — CEPHALEXIN 500 MG PO CAPS: 500.0000 mg | ORAL_CAPSULE | Freq: Two times a day (BID) | ORAL | 0 refills | Status: AC

## 2020-06-10 NOTE — Discharge Instructions (Addendum)
You were seen in the emergency department for right upper abdominal and flank pain  You have a stone in your right ureter, this is likely the cause of your pain.  Continue taking oxycodone, nausea medicine and Flomax for this.  Drink at least 2 L of water daily.  Your urine today had some bacteria, we are prescribing you Keflex which is an antibiotic in case you have an infection in your urine.  Your urine has been sent out for culture to rule out an infection.  You will be contacted if your antibiotics need to be modified or discontinued.  Imaging also revealed a right diaphragmatic hernia only containing fat through the defect, a 2 cm gallbladder stone, liver steatosis and left ovarian cyst.  All of these findings are unlikely to be the reason of your pain and have probably been there for some time.  Please make an appointment with your primary care doctor to discuss these findings.  You need to have a gynecology consult for evaluation of left ovarian cyst  Return to the ED for chest pain, shortness of breath, passing out, fevers or chills

## 2020-06-10 NOTE — ED Provider Notes (Addendum)
[facility] DEPT Provider Note   CSN: [id number] Arrival date & time: 06/10/20  1101     History Chief Complaint  Patient presents with  . Abdominal Pain  . Loss of Consciousness  . Shortness of Breath    Diana Weaver is a 45 y.o. female who presents to ED with a chief complaint of abdominal pain, shortness of breath and loss of consciousness.  States that she was seen and evaluated on 06/07/2020 for right-sided flank pain was found to have a 3 mm kidney stone.  She was discharged with Percocet, Zofran and Flomax.  She has been taking these medications with the last dose of the Percocet and Zofran being at 9 AM this morning.  States that she woke up with upper abdominal/lower chest discomfort which is new for her.  States that she is having pain with taking in a deep breath.  No leg swelling, recent immobilization.  She also reports 3 syncopal episodes today.  She reports brief, several seconds of loss of consciousness when standing up from a sitting position and walking to the bathroom.  States that she is not been able to eat anything today.  Denies any headache, vision changes, numbness in arms or legs, head injuries, history of DVT or PE, fever.  HPI     Past Medical History:  Diagnosis Date  . Renal disorder     Patient Active Problem List   Diagnosis Date Noted  . Obesity (BMI 30-39.9) 05/10/2018  . Premature contraction 05/10/2018    Past Surgical History:  Procedure Laterality Date  . epiglottic stenosis       OB History   No obstetric history on file.     Family History  Problem Relation Age of Onset  . Hypertension Mother   . Hyperlipidemia Mother   . Depression Father   . Hyperlipidemia Father   . Hypertension Father   . Breast cancer Maternal Grandfather        less than 50    Social History   Tobacco Use  . Smoking status: Former Research scientist (life sciences)  . Smokeless tobacco: Never Used  Vaping Use  . Vaping Use: Never used   Substance Use Topics  . Alcohol use: Yes    Comment: occasionally  . Drug use: Never    Home Medications Prior to Admission medications   Medication Sig Start Date End Date Taking? Authorizing Provider  cephALEXin (KEFLEX) 500 MG capsule Take 1 capsule (500 mg total) by mouth 2 (two) times daily for 7 days. 06/10/20 06/17/20 Yes Vihaan Gloss, PA-C  ondansetron (ZOFRAN ODT) 8 MG disintegrating tablet Take 1 tablet (8 mg total) by mouth every 8 (eight) hours as needed for nausea or vomiting. 06/07/20   Lacretia Leigh, MD  oxyCODONE-acetaminophen (PERCOCET/ROXICET) 5-325 MG tablet Take 1-2 tablets by mouth every 4 (four) hours as needed for severe pain. 06/07/20   Lacretia Leigh, MD  tamsulosin (FLOMAX) 0.4 MG CAPS capsule Take 1 capsule (0.4 mg total) by mouth daily. 06/07/20   Lacretia Leigh, MD    Allergies    Iodine and Latex  Review of Systems   Review of Systems  Constitutional: Negative for appetite change, chills and fever.  HENT: Negative for ear pain, rhinorrhea, sneezing and sore throat.   Eyes: Negative for photophobia and visual disturbance.  Respiratory: Positive for shortness of breath. Negative for cough, chest tightness and wheezing.   Cardiovascular: Negative for chest pain and palpitations.  Gastrointestinal: Positive for abdominal pain. Negative for blood  in stool, constipation, diarrhea, nausea and vomiting.  Genitourinary: Negative for dysuria, hematuria and urgency.  Musculoskeletal: Negative for myalgias.  Skin: Negative for rash.  Neurological: Positive for syncope. Negative for dizziness, weakness and light-headedness.    Physical Exam Updated Vital Signs BP 126/74   Pulse 92   Temp 98 F (36.7 C) (Oral)   Resp 15   Ht 5\' 4"  (1.626 m)   Wt 89.8 kg   LMP 05/27/2020   SpO2 100%   BMI 33.99 kg/m   Physical Exam Vitals and nursing note reviewed.  Constitutional:      General: She is not in acute distress.    Appearance: She is well-developed.  HENT:      Head: Normocephalic and atraumatic.     Nose: Nose normal.  Eyes:     General: No scleral icterus.       Left eye: No discharge.     Conjunctiva/sclera: Conjunctivae normal.  Cardiovascular:     Rate and Rhythm: Normal rate and regular rhythm.     Heart sounds: Normal heart sounds. No murmur heard. No friction rub. No gallop.   Pulmonary:     Effort: Pulmonary effort is normal. No respiratory distress.     Breath sounds: Normal breath sounds.  Abdominal:     General: Bowel sounds are normal. There is no distension.     Palpations: Abdomen is soft.     Tenderness: There is abdominal tenderness in the right upper quadrant and left upper quadrant. There is no guarding.  Musculoskeletal:        General: Normal range of motion.     Cervical back: Normal range of motion and neck supple.     Right lower leg: No edema.     Left lower leg: No edema.  Skin:    General: Skin is warm and dry.     Findings: No rash.  Neurological:     Mental Status: She is alert and oriented to person, place, and time.     Cranial Nerves: No cranial nerve deficit.     Sensory: No sensory deficit.     Motor: No weakness or abnormal muscle tone.     Coordination: Coordination normal.     Comments: Pupils reactive. No facial asymmetry noted. Cranial nerves appear grossly intact. Sensation intact to light touch on face, BUE and BLE. Strength 5/5 in BUE and BLE.      ED Results / Procedures / Treatments   Labs (all labs ordered are listed, but only abnormal results are displayed) Labs Reviewed  BASIC METABOLIC PANEL - Abnormal; Notable for the following components:      Result Value   Glucose, Bld 115 (*)    Creatinine, Ser 1.39 (*)    Calcium 8.7 (*)    GFR, Estimated 48 (*)    All other components within normal limits  CBC - Abnormal; Notable for the following components:   WBC 16.1 (*)    All other components within normal limits  URINALYSIS, ROUTINE W REFLEX MICROSCOPIC - Abnormal; Notable for the  following components:   Hgb urine dipstick MODERATE (*)    Ketones, ur 5 (*)    Leukocytes,Ua SMALL (*)    Bacteria, UA RARE (*)    All other components within normal limits  D-DIMER, QUANTITATIVE - Abnormal; Notable for the following components:   D-Dimer, Quant 0.72 (*)    All other components within normal limits  HEPATIC FUNCTION PANEL - Abnormal; Notable for the following components:  AST 13 (*)    All other components within normal limits  URINE CULTURE  LIPASE, BLOOD  CBG MONITORING, ED  I-STAT BETA HCG BLOOD, ED ([facility], [facility], [facility] ONLY)    EKG None  Radiology DG Chest 2 View  Result Date: 06/10/2020 CLINICAL DATA:  45 year old female with shortness of breath, chest and abdominal pain. EXAM: CHEST - 2 VIEW COMPARISON:  CT Abdomen and Pelvis 06/07/2020. FINDINGS: Mediastinal contours reflecting prominent right cardiophrenic angle epicardial fat - fat containing hernia as seen on the recent CT. No cardiomegaly. Other mediastinal contours are within normal limits. Visualized tracheal air column is within normal limits. Somewhat low lung volumes but no pneumothorax, pulmonary edema, pleural effusion or confluent pulmonary opacity. Negative visible bowel gas pattern. No acute osseous abnormality identified. IMPRESSION: No acute cardiopulmonary abnormality. Right cardiophrenic angle fat containing hernia. Electronically Signed   By: Genevie Ann M.D.   On: 06/10/2020 11:57    Procedures Procedures   Medications Ordered in ED Medications  sodium chloride 0.9 % bolus 1,000 mL (1,000 mLs Intravenous New Bag/Given 06/10/20 1230)  sodium chloride 0.9 % bolus 500 mL (500 mLs Intravenous New Bag/Given 06/10/20 1307)  morphine 4 MG/ML injection 4 mg (4 mg Intravenous Given 06/10/20 1311)  iohexol (OMNIPAQUE) 350 MG/ML injection 100 mL (80 mLs Intravenous Contrast Given 06/10/20 1525)    ED Course  I have reviewed the triage vital signs and the nursing notes.  Pertinent labs & imaging results that were  available during my care of the patient were reviewed by me and considered in my medical decision making (see chart for details).  Clinical Course as of 06/10/20 1546  Mon Jun 10, 2020  1225 Orthostatic Lying  BP- Lying:128/77 Pulse- Lying:87 Orthostatic Sitting BP- Sitting:122/86 Pulse- Sitting:86 Orthostatic Standing at 0 minutes BP- Standing at 0 minutes:133/79 Pulse- Standing at 0 minutes:88 Orthostatic Standing at 3 minutes BP- Standing at 3 minutes:134/90 Pulse- Standing at 3 minutes:102 [HK]  1226 WBC(!): 16.1 [HK]  1343 Creatinine(!): 1.39 [HK]  1343 Chalmers Guest): SMALL [HK]  1343 Bacteria, UA(!): RARE [HK]  1440 D-Dimer, Quant(!): 0.72 [HK]  1445 Hepatic function panel(!) Unremarkable. [HK]  1524 Lipase: 21 [HK]  1531 Recurrent syncope x 3 Lower chest pain Pending CTA PE [CG]  1533 DG Chest 2 View IMPRESSION: No acute cardiopulmonary abnormality. Right cardiophrenic angle fat containing hernia. [CG]    Clinical Course User Index [CG] Kinnie Feil, PA-C [HK] Delia Heady, [place]   MDM Rules/Calculators/A&P                          45 year old female presenting to the ED with a chief complaint of abdominal pain, shortness of breath and loss of consciousness.  She was seen and evaluated on 06/07/2020 for right-sided flank pain found to have a 3 mm kidney stone.  Discharged with Percocet, Zofran and Flomax.  She has been taking these medications with the last dose of Percocet and Zofran being at 9 AM this morning.  Woke up with upper abdominal discomfort, lower chest pain which is new for her.  Reports pain is worse with taking a deep breath.  Denies any leg swelling, recent immobilization.  Reports 3 episodes of brief syncope earlier today when ambulating to the bathroom and standing up from a sitting position.  Denies any headache, vision changes, numbness in arms or legs, history of DVT or PE.  Denies any fever.  On exam there is tenderness palpation of the  upper abdomen and lower chest area. No lower extremity edema, erythema or calf tenderness that would concern me for DVT. No neurological deficits noted. EKG here shows sinus rhythm, no ischemic changes, no STEMI.  Lungs are clear to auscultation bilaterally and chest x-ray is unremarkable.  Lab work significant for creatinine of 1.3, leukocytosis of 16.  Urinalysis now with small leukocytes and rare bacteria, this was sent for culture.  D-dimer slightly elevated at 0.72, this was obtained due to her atypical chest pain that is pleuritic in nature as well as her recurrent syncope.  Will give IV pain medications, fluids and reassess after obtaining LFTs, lipase. Will need CT angio of the chest to rule out PE.  3:03 PM Lipase, LFTs unremarkable.  Patient reports improvement in symptoms with medications and fluids. Will obtain CT angio of the chest to rule out PE. Anticipate discharge home if symptoms improved here with continued symptom control for kidney stone. I will add antibiotic coverage due to leukocytes and bacteria in urine in the setting of kidney stone. Care handed off to oncoming provider pending CT.    Portions of this note were generated with [employer]. Dictation errors may occur despite best attempts at proofreading.  Final Clinical Impression(s) / ED Diagnoses Final diagnoses:  AKI (acute kidney injury) ([place])  Lower urinary tract infectious disease    Rx / DC Orders ED Discharge Orders         Ordered    cephALEXin (KEFLEX) 500 MG capsule  2 times daily        06/10/20 [place], Cyrene Gharibian, PA-C 06/10/20 [place], Mark, PA-C 06/10/20 [place], Quita Skye, DO 06/13/20 [phone]

## 2020-06-10 NOTE — ED Provider Notes (Addendum)
1530: Patient signed out to me by previous EDPA at shift change.  Please see previous note for full details.  Plan is to follow-up on CTA to rule out a PE given right-sided abdominal, pleuritic pain with syncope and shortness of breath.  If negative, previous ED PA recommending discharge.  Briefly, 45 year old female presents to the ED for right upper quadrant and flank pain.  Reported shortness of breath and syncopal episodes.  Seen in the ED 4/2 for right flank pain and diagnosed with ureteral stone.  She was discharged with oxycodone, Zofran and Flomax.  Her pain was reportedly worse with deep breathing.  Her syncopal episodes occurred when she went from sitting/laying to standing.  On my initial exam, patient feels better.  Confirms that she was lightheaded and felt generally weak falling to the ground.  Denies actual loss of consciousness.  All of these episodes happen when she was going from sitting to standing.  She had no associated chest pain with it.  Has persistent right upper quadrant and flank tenderness on my exam.  Has received 1.5 L IV fluids and has been ambulatory in the ED to the bathroom without any lightheadedness or syncope. Physical Exam  BP 135/84   Pulse 92   Temp 98 F (36.7 C) (Oral)   Resp 18   Ht 5\' 4"  (1.626 m)   Wt 89.8 kg   LMP 05/27/2020   SpO2 97%   BMI 33.99 kg/m   Physical Exam Abdominal:     Tenderness: There is abdominal tenderness in the right upper quadrant and epigastric area. There is right CVA tenderness.     ED Course/Procedures   Clinical Course as of 06/10/20 1924  Mon Jun 10, 2020  1225 Orthostatic Lying  BP- Lying:128/77 Pulse- Lying:87 Orthostatic Sitting BP- Sitting:122/86 Pulse- Sitting:86 Orthostatic Standing at 0 minutes BP- Standing at 0 minutes:133/79 Pulse- Standing at 0 minutes:88 Orthostatic Standing at 3 minutes BP- Standing at 3 minutes:134/90 Pulse- Standing at 3 minutes:102 [HK]  1226 WBC(!): 16.1 [HK]  1343  Creatinine(!): 1.39 [HK]  1343 Chalmers Guest): SMALL [HK]  1343 Bacteria, UA(!): RARE [HK]  1440 D-Dimer, Quant(!): 0.72 [HK]  1445 Hepatic function panel(!) Unremarkable. [HK]  1524 Lipase: 21 [HK]  1531 Recurrent syncope x 3 Lower chest pain Pending CTA PE [CG]  1533 DG Chest 2 View IMPRESSION: No acute cardiopulmonary abnormality. Right cardiophrenic angle fat containing hernia. [CG]  1622 I evaluated the patient. Just received fentanyl. Reports overall improvement in pain but still there. Points to right upper quadrant and flank and "pressure across upper abdomen".  +RUQ tenderness and RCVAT.  CTA negative for PE, noted cholelithiasis however the whole gallbladder was not visualized. Given persistent RUQ tenderness, leukocytosis discussed possibility of cholecystitis - although unlikely given normal LFTs. Will proceed with RUQ [place], patient agreeable.  [CG]  1858 US Abdomen Limited RUQ (LIVER/GB) IMPRESSION: 1. Cholelithiasis without sonographic evidence of acute cholecystitis. 2. Fatty liver. [CG]  1908 Reevaluated patient again.  Reports return of right upper quadrant and flank pain now a 4/10.  Discussed at length her imaging studies CT renal on 4/2 and CT angio today as well as right upper quadrant ultrasound that were done by previous providers.  She is aware of the right diaphragmatic hernia, gallstone, ureteral stone as well as suspicious ovarian cyst that needs [facility] follow-up.  Again discussed her recurrent near syncopal episodes today.  She has been ambulatory here without any dizziness or syncope.  Has no chest pain with it.  Offered admission given recurrent syncope but patient feels comfortable being discharged home.  Husband at bedside as well agrees.  Feeling this is reasonable as she reported decisional lightheadedness and this improved with IV fluids.  Will discharge.   [CG]    Clinical Course User Index [CG] Kinnie Feil, PA-C [HK] Delia Heady, PA-C     Procedures  MDM   [phone]: CTA is negative for PE.  She has cholelithiasis and entire gallbladder not visualized.  Given persistent right upper quadrant tenderness, WBC 16.1 RUQ ultrasound was obtained to rule out cholecystitis.  Suspicion is low for this given normal LFTs.  Ultrasound was negative for cholecystitis but showed hepatic steatosis and 2 cm gallstone.  UA with bacteria but she has no dysuria, previous PA prescribed keflex for this. Urine pending. Creatinine today 1.39, she received 1.5 L IVF. Unknown baseline.   Imaging studies including CT renal, CTA, right upper quadrant ultrasound discussed with patient and her husband.  She is aware of imaging study results including diaphragmatic hernia, ureteral stone, gallbladder stone and suspicious ovarian cyst.  Again, offered admission given recurrent syncope for telemetry monitoring but patient felt comfortable being discharged.  Reasonable given that she received IV fluids and lightheadedness with near syncope have completely resolved.  There may be some degree of dehydration, orthostasis.  Imaging discussed with EDP [place]. Patient discharged in good condition. Strict return precautions given.       Kinnie Feil, PA-C 06/10/20 1930    Valarie Merino, MD 06/11/20 [phone]

## 2020-06-10 NOTE — ED Triage Notes (Signed)
Patient c/o right flank pain, abdominal pain and was seen 3 days ago for a kidney stone. Patient also reports that she passed out 3 times today, the last being prior to coming to the ED.

## 2020-06-10 NOTE — ED Notes (Signed)
Patient is off the floor for scan

## 2020-06-11 LAB — URINE CULTURE: Culture: <10000 — AB

## 2020-06-14 DIAGNOSIS — N201 Calculus of ureter: Secondary | ICD-10-CM | POA: Diagnosis not present

## 2020-06-14 DIAGNOSIS — K8020 Calculus of gallbladder without cholecystitis without obstruction: Secondary | ICD-10-CM | POA: Diagnosis not present

## 2020-06-14 DIAGNOSIS — N83202 Unspecified ovarian cyst, left side: Secondary | ICD-10-CM | POA: Diagnosis not present

## 2020-06-14 DIAGNOSIS — R55 Syncope and collapse: Secondary | ICD-10-CM | POA: Diagnosis not present

## 2020-12-19 DIAGNOSIS — Z1231 Encounter for screening mammogram for malignant neoplasm of breast: Secondary | ICD-10-CM | POA: Diagnosis not present

## 2020-12-19 DIAGNOSIS — Z01419 Encounter for gynecological examination (general) (routine) without abnormal findings: Secondary | ICD-10-CM | POA: Diagnosis not present

## 2020-12-25 ENCOUNTER — Other Ambulatory Visit: Payer: Self-pay | Admitting: Obstetrics and Gynecology

## 2020-12-25 DIAGNOSIS — R928 Other abnormal and inconclusive findings on diagnostic imaging of breast: Secondary | ICD-10-CM

## 2020-12-27 DIAGNOSIS — E781 Pure hyperglyceridemia: Secondary | ICD-10-CM | POA: Diagnosis not present

## 2020-12-27 DIAGNOSIS — E785 Hyperlipidemia, unspecified: Secondary | ICD-10-CM | POA: Diagnosis not present

## 2021-01-03 DIAGNOSIS — F329 Major depressive disorder, single episode, unspecified: Secondary | ICD-10-CM | POA: Diagnosis not present

## 2021-01-03 DIAGNOSIS — Z1339 Encounter for screening examination for other mental health and behavioral disorders: Secondary | ICD-10-CM | POA: Diagnosis not present

## 2021-01-03 DIAGNOSIS — R7303 Prediabetes: Secondary | ICD-10-CM | POA: Diagnosis not present

## 2021-01-03 DIAGNOSIS — Z0000 Encounter for general adult medical examination without abnormal findings: Secondary | ICD-10-CM | POA: Diagnosis not present

## 2021-01-03 DIAGNOSIS — Z23 Encounter for immunization: Secondary | ICD-10-CM | POA: Diagnosis not present

## 2021-01-03 DIAGNOSIS — E785 Hyperlipidemia, unspecified: Secondary | ICD-10-CM | POA: Diagnosis not present

## 2021-01-14 ENCOUNTER — Ambulatory Visit
Admission: RE | Admit: 2021-01-14 | Discharge: 2021-01-14 | Disposition: A | Discharge status: Home / Self Care | Payer: Federal, State, Local not specified - PPO | Source: Ambulatory Visit | Attending: Obstetrics and Gynecology | Admitting: Obstetrics and Gynecology

## 2021-01-14 ENCOUNTER — Other Ambulatory Visit: Payer: Self-pay

## 2021-01-14 ENCOUNTER — Other Ambulatory Visit: Payer: Federal, State, Local not specified - PPO

## 2021-01-14 ENCOUNTER — Ambulatory Visit: Payer: Federal, State, Local not specified - PPO

## 2021-01-14 ENCOUNTER — Other Ambulatory Visit: Payer: Self-pay | Admitting: Obstetrics and Gynecology

## 2021-01-14 DIAGNOSIS — R928 Other abnormal and inconclusive findings on diagnostic imaging of breast: Secondary | ICD-10-CM

## 2021-01-14 DIAGNOSIS — N6321 Unspecified lump in the left breast, upper outer quadrant: Secondary | ICD-10-CM | POA: Diagnosis not present

## 2021-01-14 DIAGNOSIS — R922 Inconclusive mammogram: Secondary | ICD-10-CM | POA: Diagnosis not present

## 2021-01-14 IMAGING — MG DIGITAL DIAGNOSTIC BILAT W/ TOMO W/ CAD
8 of 16 series · 8 of 40 positions shown · non-contrast
Comparison: Previous exam(s).

CLINICAL DATA: 45-year-old female recalled from screening mammogram
dated [DATE] for possible right breast asymmetry and possible
left breast mass with this distortion.

EXAM:
DIGITAL DIAGNOSTIC BILATERAL MAMMOGRAM WITH TOMOSYNTHESIS AND CAD;
ULTRASOUND LEFT BREAST LIMITED
TECHNIQUE: Bilateral digital diagnostic mammography and breast tomosynthesis
was performed. The images were evaluated with computer-aided
detection.; Targeted ultrasound examination of the left breast was
performed.

[R MLO synth-2D (1 of 2)]
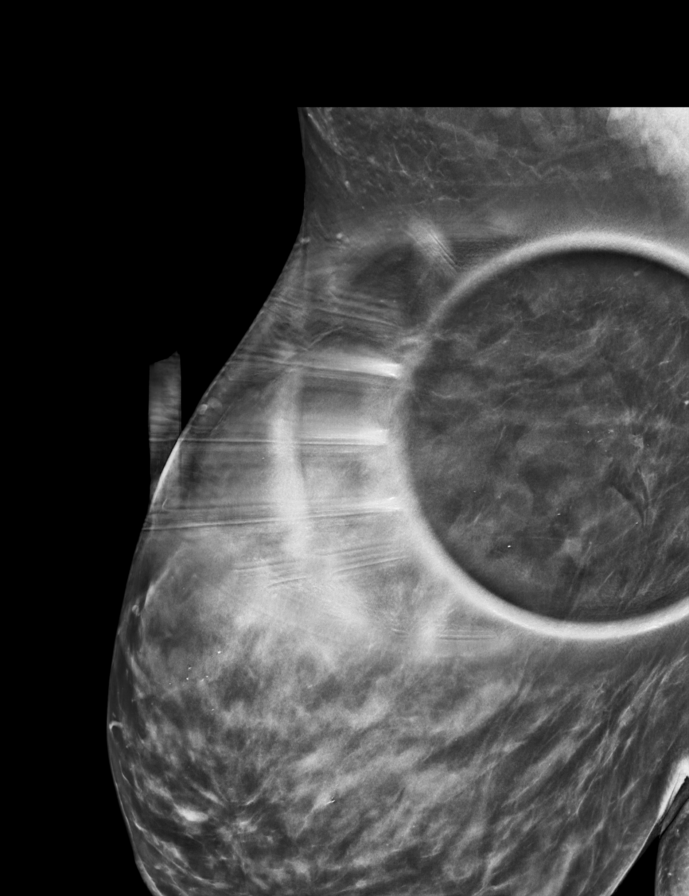

[L ML synth-2D (1 of 2)]
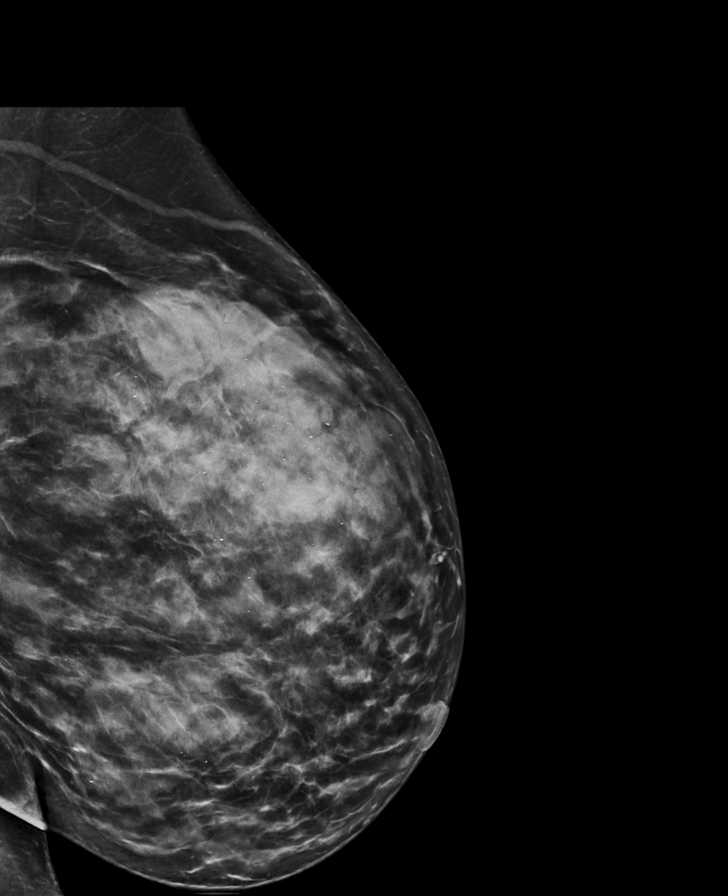

[L CC synth-2D (1 of 2)]
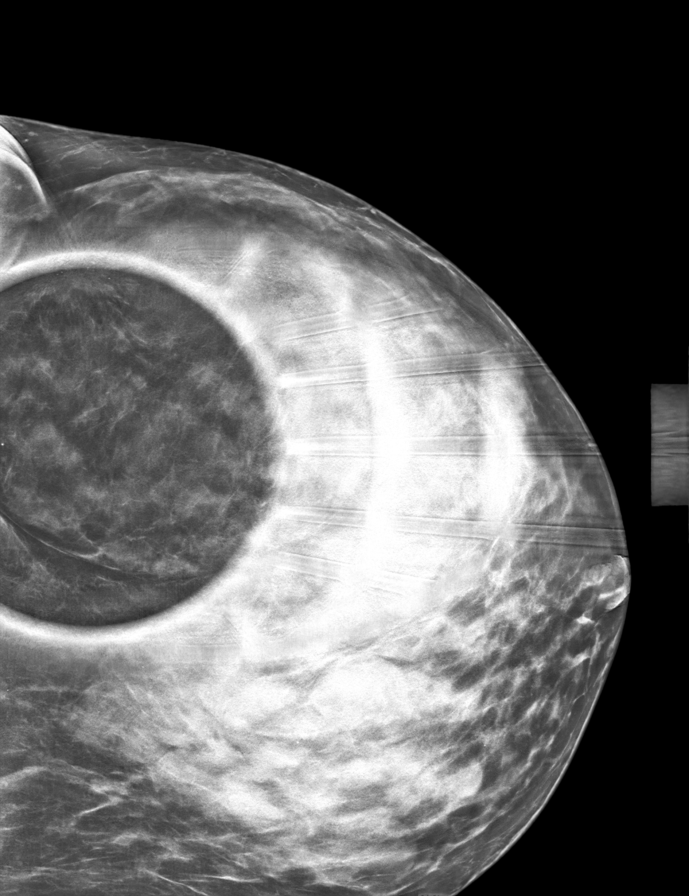

[R XCCL synth-2D]
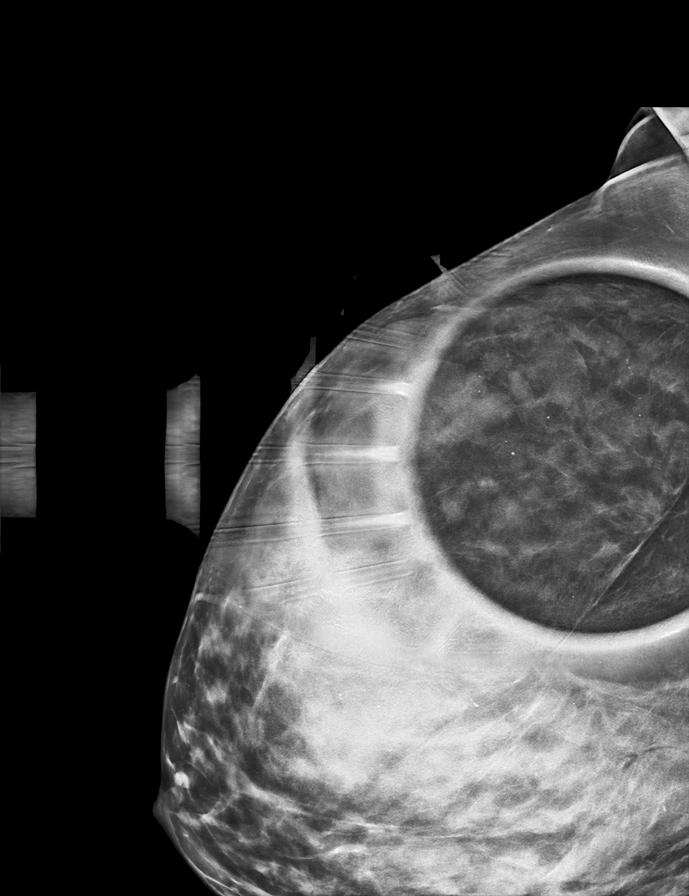

[L ML synth-2D (2 of 2)]
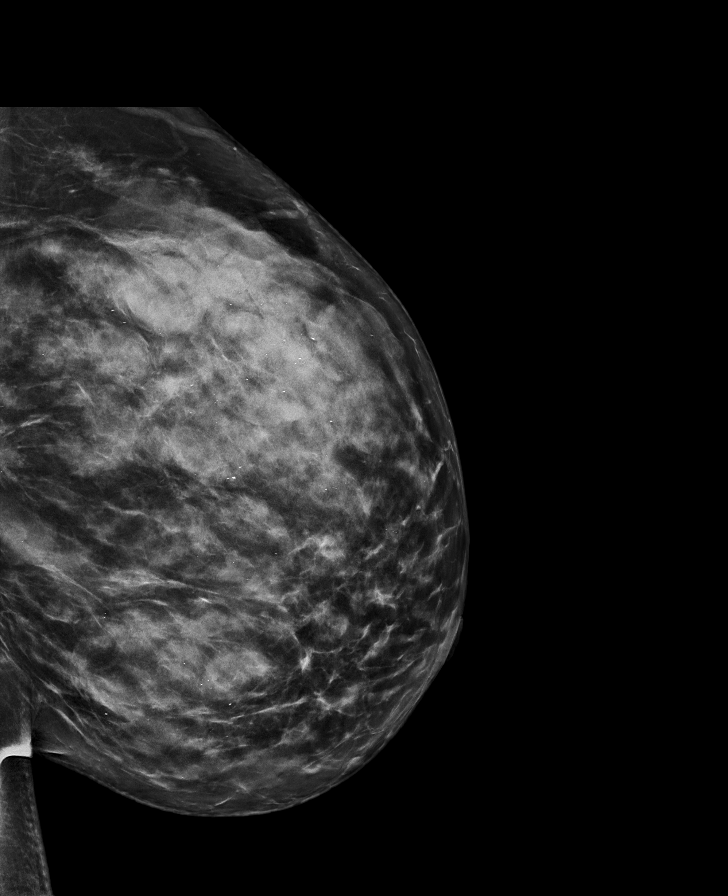

[R MLO synth-2D (2 of 2)]
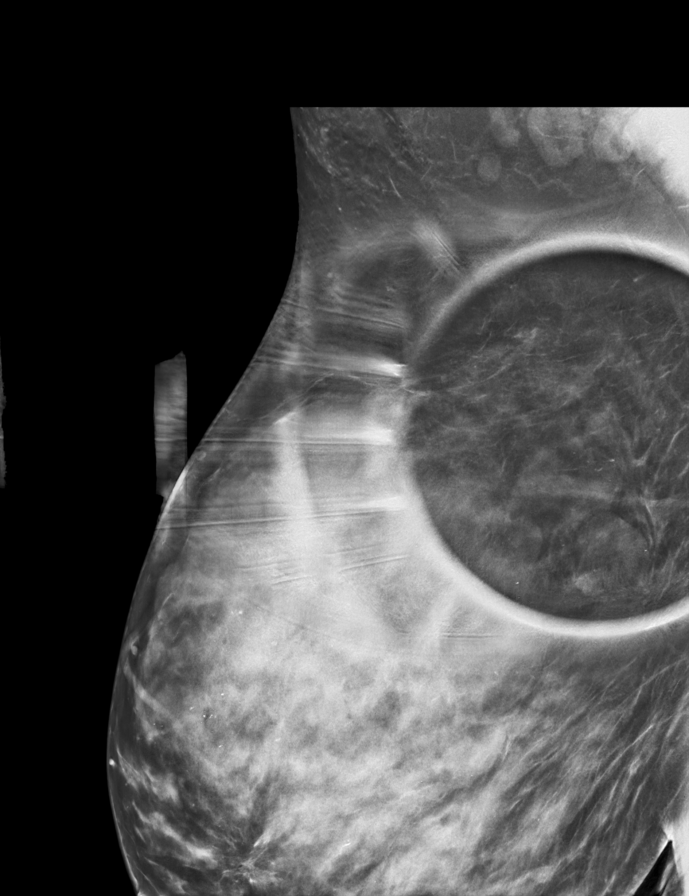

[L CC synth-2D (2 of 2)]
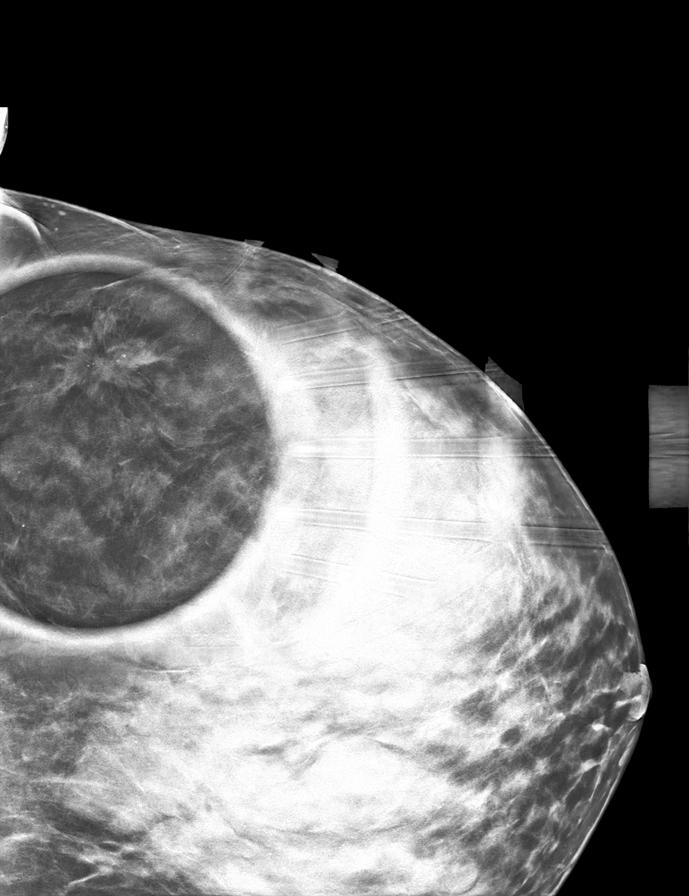

[L MLO tomo · tomo slice 39/78.0]
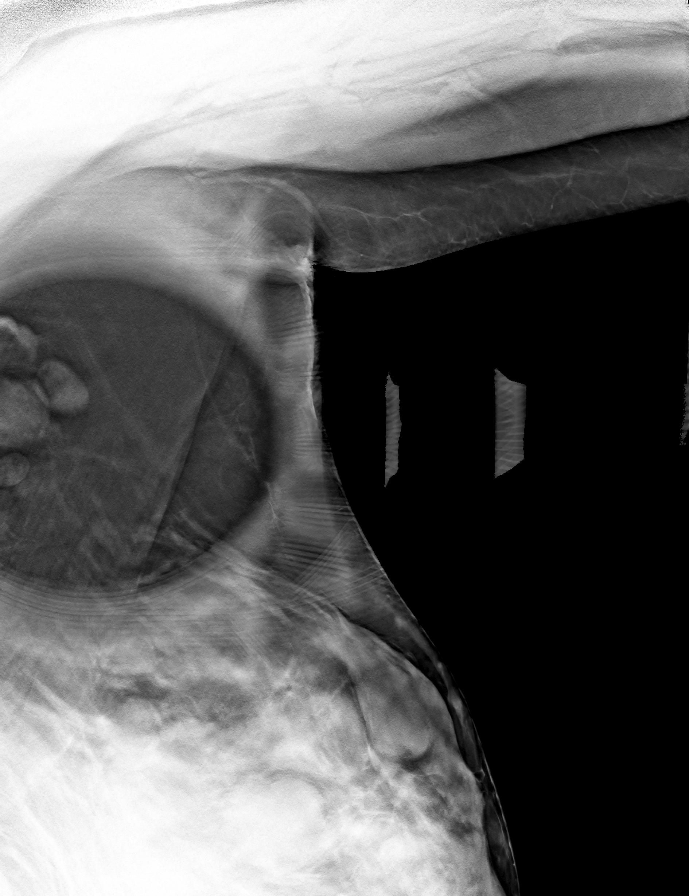

[8 of 40 positions shown; findings below may reference images not displayed]

ACR Breast Density Category d: The breast tissue is extremely dense,
which lowers the sensitivity of mammography.
FINDINGS: Additional spot tomosynthesis views of the far upper outer quadrant
of the right breast demonstrates no persistent asymmetry. Note is
made of layering calcifications consistent with benign milk of
calcium and numerous circumscribed cysts scattered throughout the
right breast.

There is a persistent irregular, spiculated mass in the upper-outer
quadrant of the left breast at posterior depth. Further evaluation
with ultrasound was performed. Again, numerous circumscribed cysts
are scattered throughout the left breast.

Targeted ultrasound is performed, showing an irregular, hypoechoic
mass with associated vascularity at the [DATE] position 9 cm from the
nipple. It measures 2.0 x 1.8 x 1.7 cm. This correlates well with
the mammographic finding. Evaluation of the left axilla demonstrates
a single morphologically abnormal lymph node with diffuse cortical
thickening up to 6 mm.
IMPRESSION: 1. Suspicious left breast mass at the [DATE] position 9 cm from the
nipple corresponding with the screening mammographic findings.
Recommendation is for ultrasound-guided biopsy.
2. Single, morphologically abnormal left axillary lymph node.
Recommendation is for ultrasound-guided biopsy.
3. No persistent, suspicious findings in the right breast.

RECOMMENDATION:
1. Two area ultrasound-guided biopsy of the left breast and left
axilla.
2. Pending return of pathology results, recommend further evaluation
with contrast enhanced MRI given the patient's extreme breast
density and cystic pattern.

I have discussed the findings and recommendations with the patient.
If applicable, a reminder letter will be sent to the patient
regarding the next appointment.

BI-RADS CATEGORY  5: Highly suggestive of malignancy.

## 2021-01-21 ENCOUNTER — Ambulatory Visit
Admission: RE | Admit: 2021-01-21 | Discharge: 2021-01-21 | Disposition: A | Discharge status: Home / Self Care | Payer: Federal, State, Local not specified - PPO | Source: Ambulatory Visit | Attending: Obstetrics and Gynecology | Admitting: Obstetrics and Gynecology

## 2021-01-21 DIAGNOSIS — R590 Localized enlarged lymph nodes: Secondary | ICD-10-CM | POA: Diagnosis not present

## 2021-01-21 DIAGNOSIS — C50412 Malignant neoplasm of upper-outer quadrant of left female breast: Secondary | ICD-10-CM | POA: Diagnosis not present

## 2021-01-21 DIAGNOSIS — R928 Other abnormal and inconclusive findings on diagnostic imaging of breast: Secondary | ICD-10-CM

## 2021-01-21 DIAGNOSIS — N6321 Unspecified lump in the left breast, upper outer quadrant: Secondary | ICD-10-CM | POA: Diagnosis not present

## 2021-01-21 DIAGNOSIS — C773 Secondary and unspecified malignant neoplasm of axilla and upper limb lymph nodes: Secondary | ICD-10-CM | POA: Diagnosis not present

## 2021-01-21 DIAGNOSIS — Z170 Estrogen receptor positive status [ER+]: Secondary | ICD-10-CM | POA: Diagnosis not present

## 2021-01-21 IMAGING — US US BREAST BX W LOC DEV 1ST LESION IMG BX SPEC US GUIDE*L*
1 series · 12 of 18 positions shown · non-contrast
Comparison: Previous exam(s).
COMPARISON: Previous exam(s).

Addendum:
CLINICAL DATA: Patient with suspicious left breast mass and
thickened left axillary lymph node.

EXAM:
ULTRASOUND GUIDED LEFT BREAST CORE NEEDLE BIOPSY

[Series 1: us breast bx w loc dev 1st lesion img bx spec us g · 0.07mm/px · 12 of 18 slices shown]
[im 1/18]
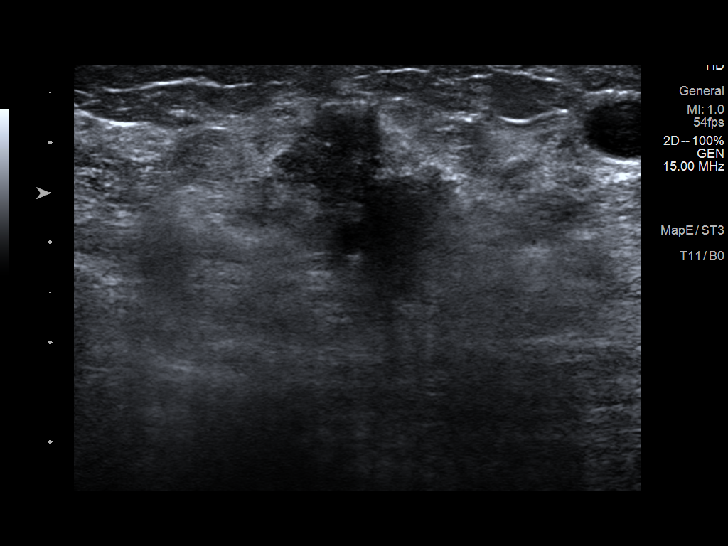
[im 3/18]
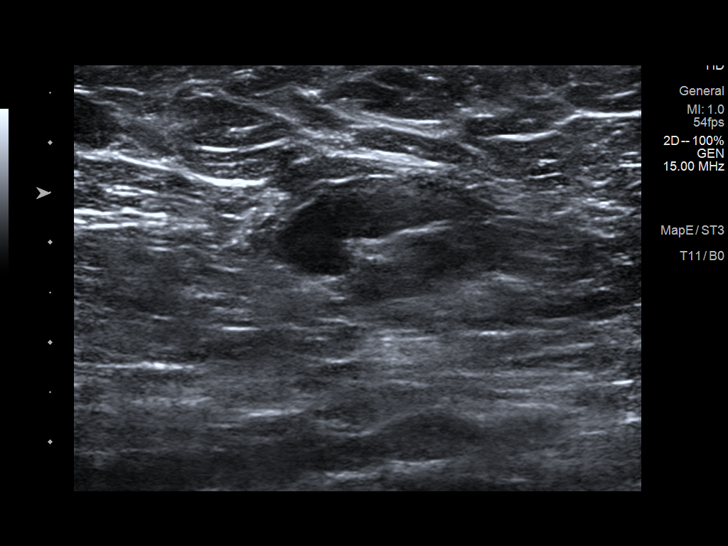
[im 4/18]
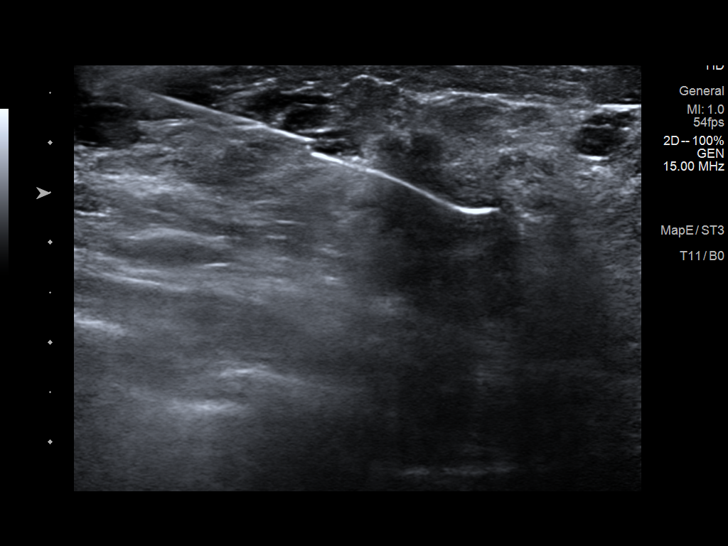
[im 6/18]
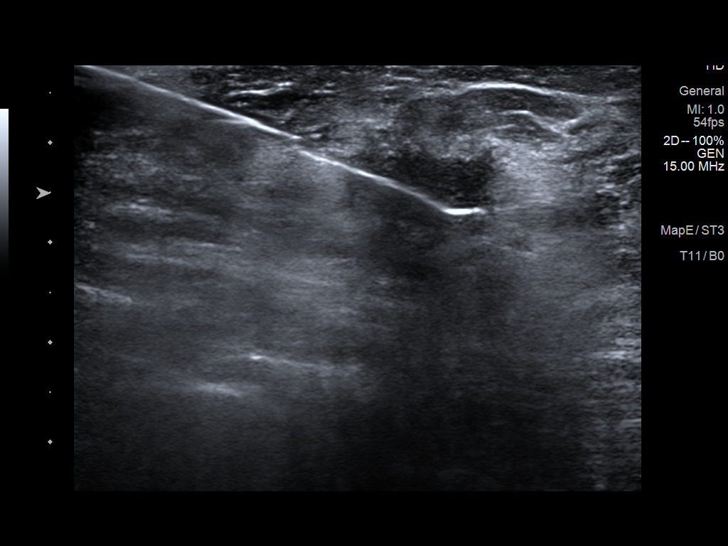
[im 7/18]
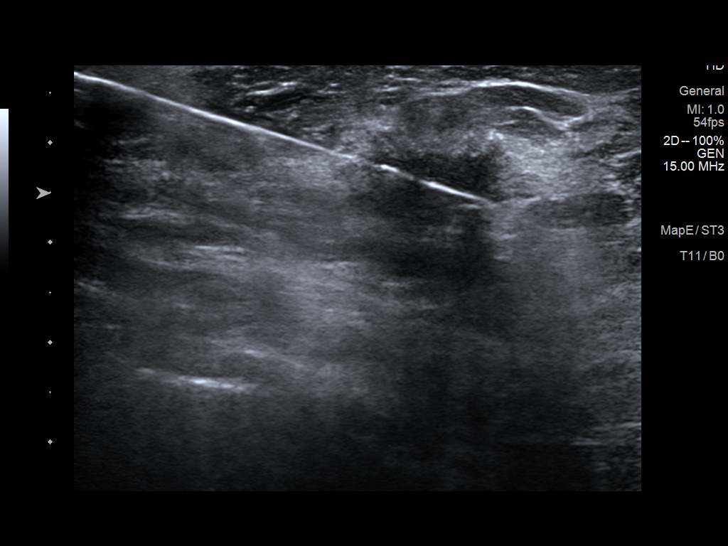
[im 9/18]
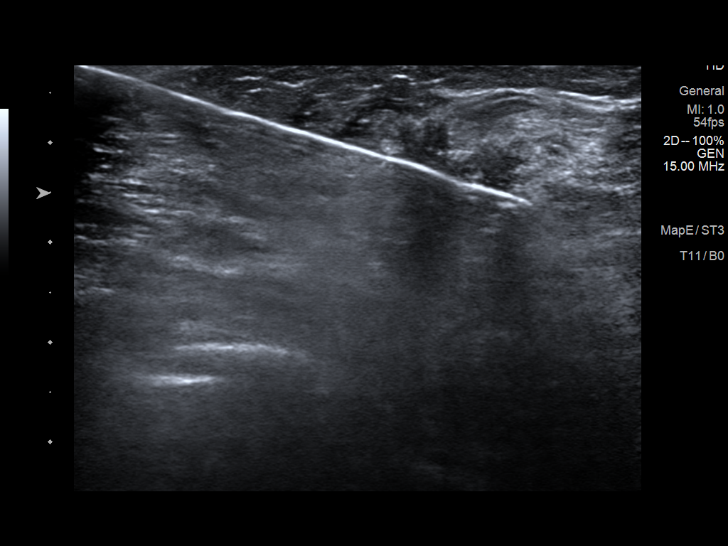
[im 10/18]
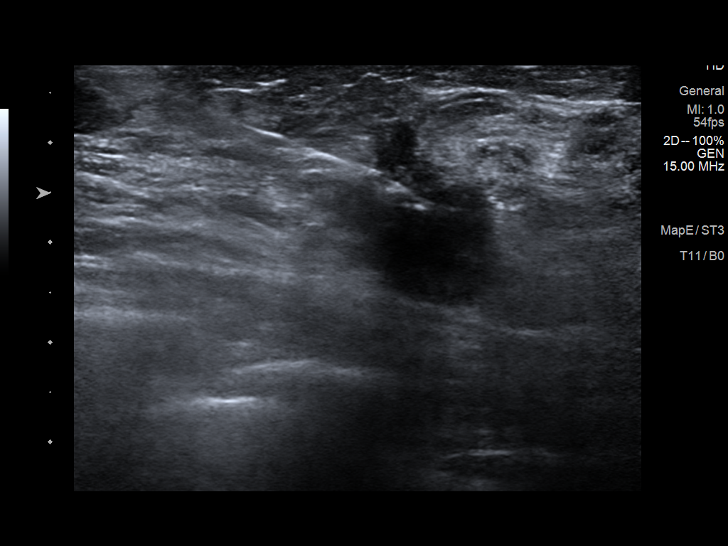
[im 12/18]
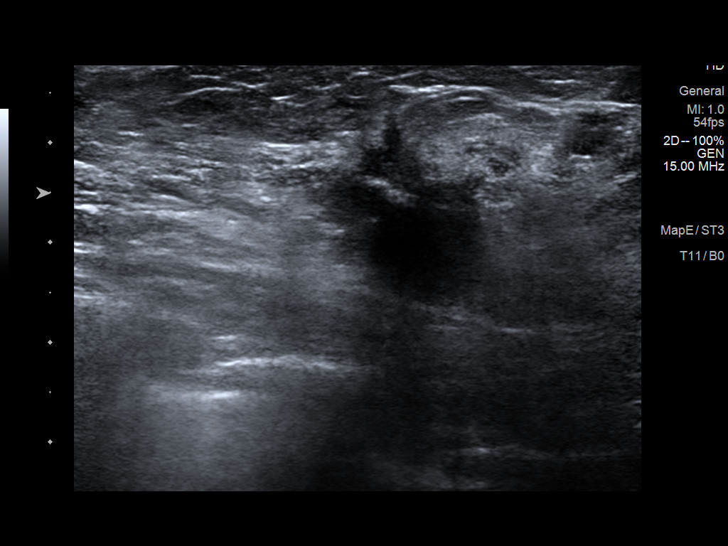
[im 13/18]
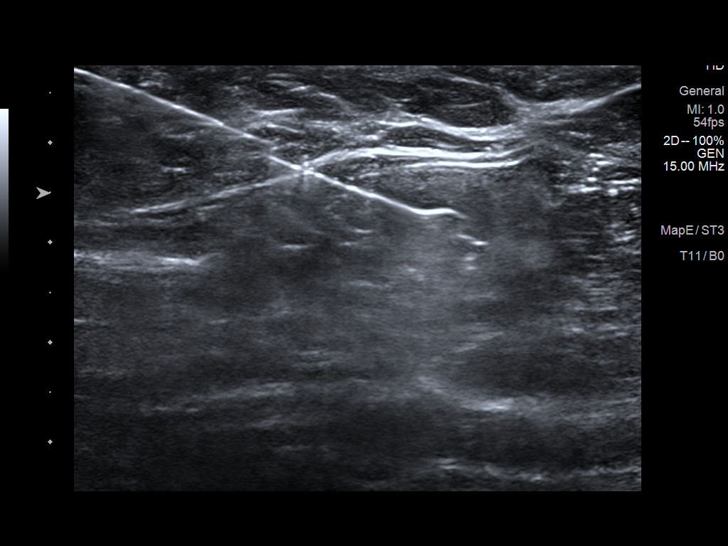
[im 15/18]
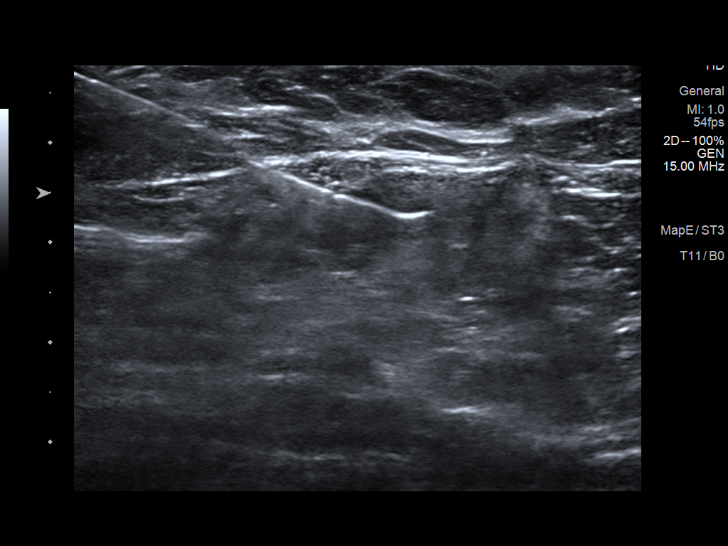
[im 16/18]
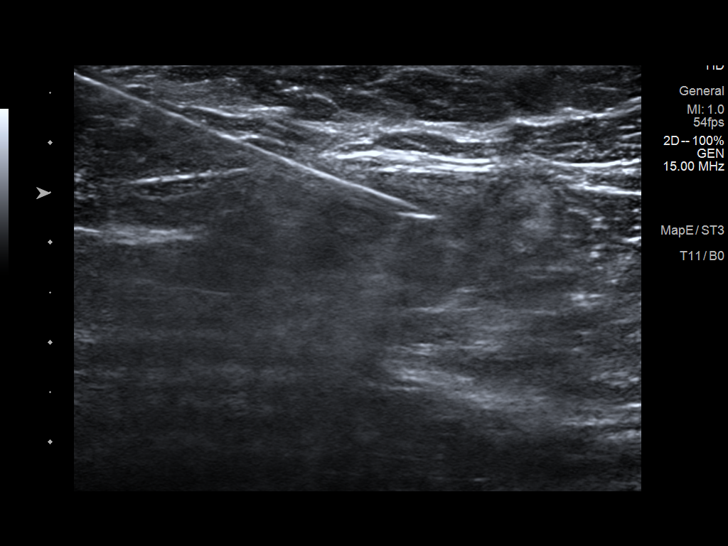
[im 18/18]
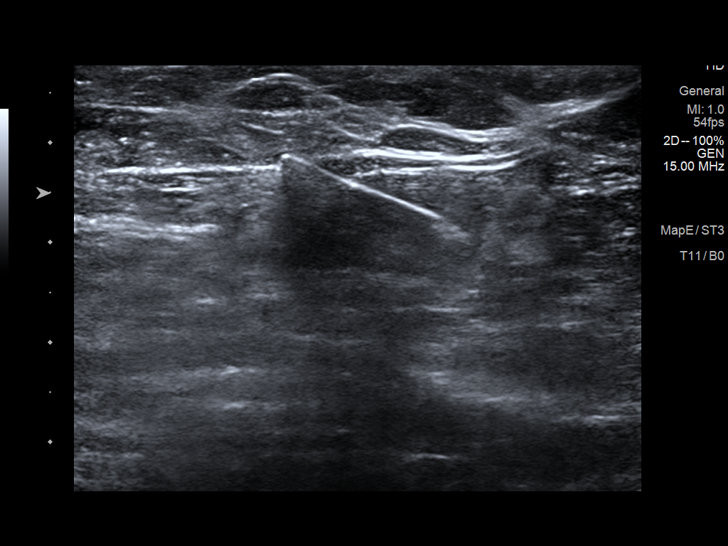

[12 of 18 positions shown; findings below may reference images not displayed]



Site 1: Left breast mass 2:30 o'clock

Lesion quadrant: Upper outer quadrant

Using sterile technique and 1% Lidocaine as local anesthetic, under
direct ultrasound visualization, a 14 gauge WIEME device was
used to perform biopsy of left breast mass 2:30 o'clock using a
lateral approach. At the conclusion of the procedure ribbon shaped
tissue marker clip was deployed into the biopsy cavity. Follow up 2
view mammogram was performed and dictated separately.

Site 2: Left axillary lymph node.

Using sterile technique and 1% Lidocaine as local anesthetic, under
direct ultrasound visualization, a 14 gauge WIEME device was
used to perform biopsy of left axillary lymph node using a lateral
approach. At the conclusion of the procedure tribell tissue marker
clip was deployed into the biopsy cavity. Follow up 2 view mammogram
was performed and dictated separately.
IMPRESSION: Ultrasound guided biopsy of left breast mass and left axillary lymph
node. No apparent complications.

ADDENDUM:
Pathology revealed GRADE II INVASIVE DUCTAL CARCINOMA, DUCTAL
CARCINOMA IN SITU of the LEFT breast, 2:30 o'clock, [T4], (ribbon
clip). This was found to be concordant by Dr. WIEME.

Pathology revealed METASTATIC CARCINOMA INVOLVING NODAL TISSUE of
the LEFT axilla, (tribell clip). This was found to be concordant by
Dr. WIEME.

Pathology results were discussed with the patient by telephone. The
patient reported doing well after the biopsies with tenderness and
bruising at the sites. Post biopsy instructions and care were
reviewed and questions were answered. The patient was encouraged to
call The [REDACTED] for any additional
concerns. My direct phone number was provided.

Surgical consultation has been arranged with Dr. WIEME at
[REDACTED] on [DATE].

Pathology results reported by WIEME, RN on [DATE].



Site 1: Left breast mass 2:30 o'clock

Lesion quadrant: Upper outer quadrant

Using sterile technique and 1% Lidocaine as local anesthetic, under
direct ultrasound visualization, a 14 gauge WIEME device was
used to perform biopsy of left breast mass 2:30 o'clock using a
lateral approach. At the conclusion of the procedure ribbon shaped
tissue marker clip was deployed into the biopsy cavity. Follow up 2
view mammogram was performed and dictated separately.

Site 2: Left axillary lymph node.

Using sterile technique and 1% Lidocaine as local anesthetic, under
direct ultrasound visualization, a 14 gauge WIEME device was
used to perform biopsy of left axillary lymph node using a lateral
approach. At the conclusion of the procedure tribell tissue marker
clip was deployed into the biopsy cavity. Follow up 2 view mammogram
was performed and dictated separately.
IMPRESSION: Ultrasound guided biopsy of left breast mass and left axillary lymph
node. No apparent complications.

## 2021-01-21 IMAGING — MG MM BREAST LOCALIZATION CLIP
4 series · 4 of 12 positions shown · non-contrast
Comparison: Previous exam(s).

CLINICAL DATA: Patient status post ultrasound-guided biopsy left
breast mass and left axillary lymph node

EXAM:
3D DIAGNOSTIC LEFT MAMMOGRAM POST ULTRASOUND BIOPSY

[L XCCL synth-2D]
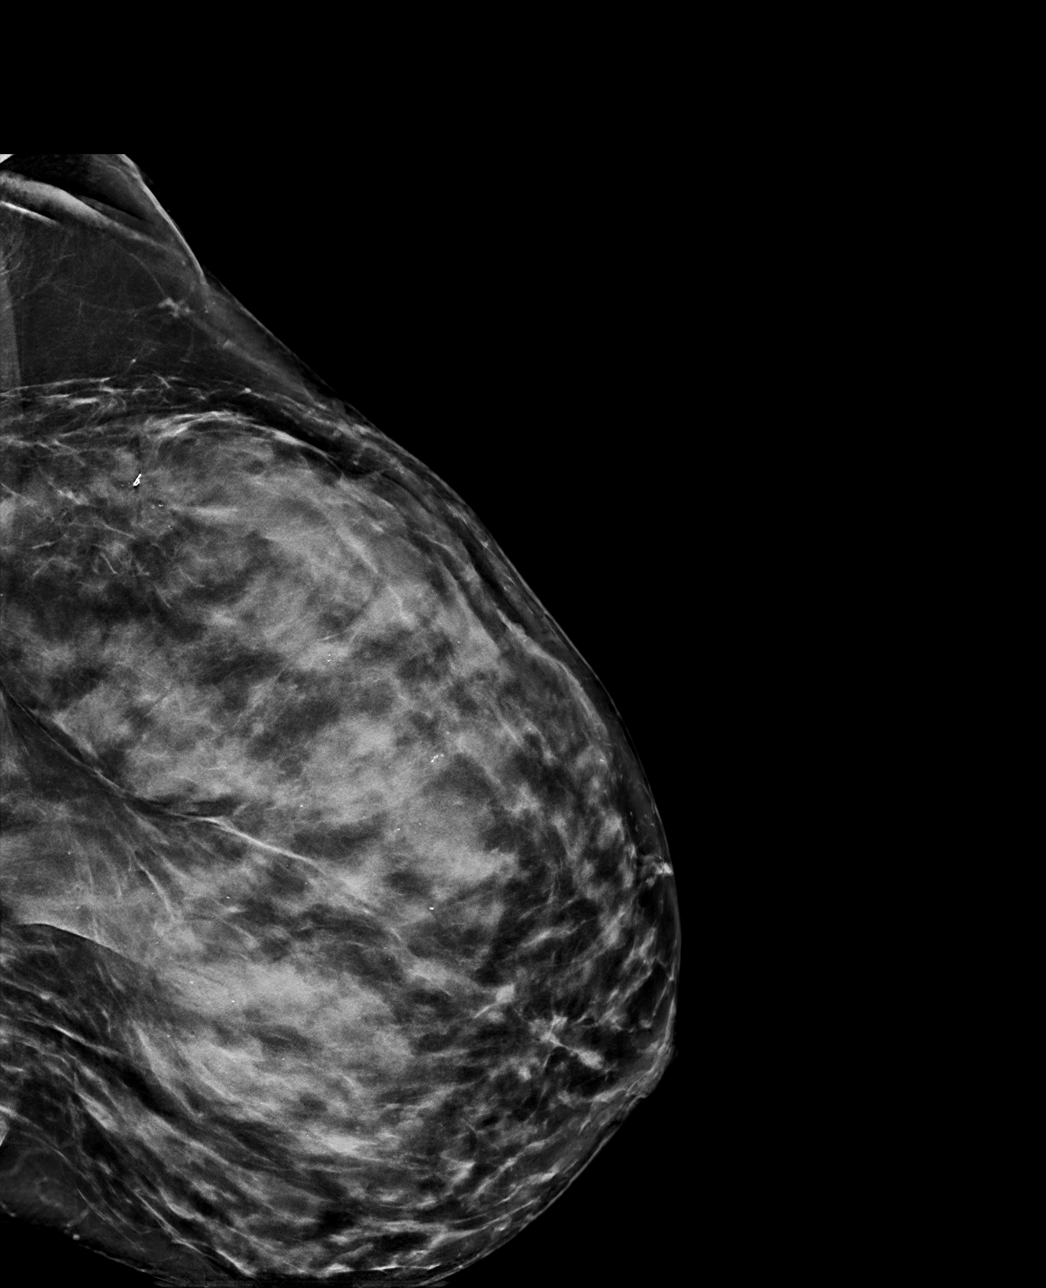

[L ML synth-2D]
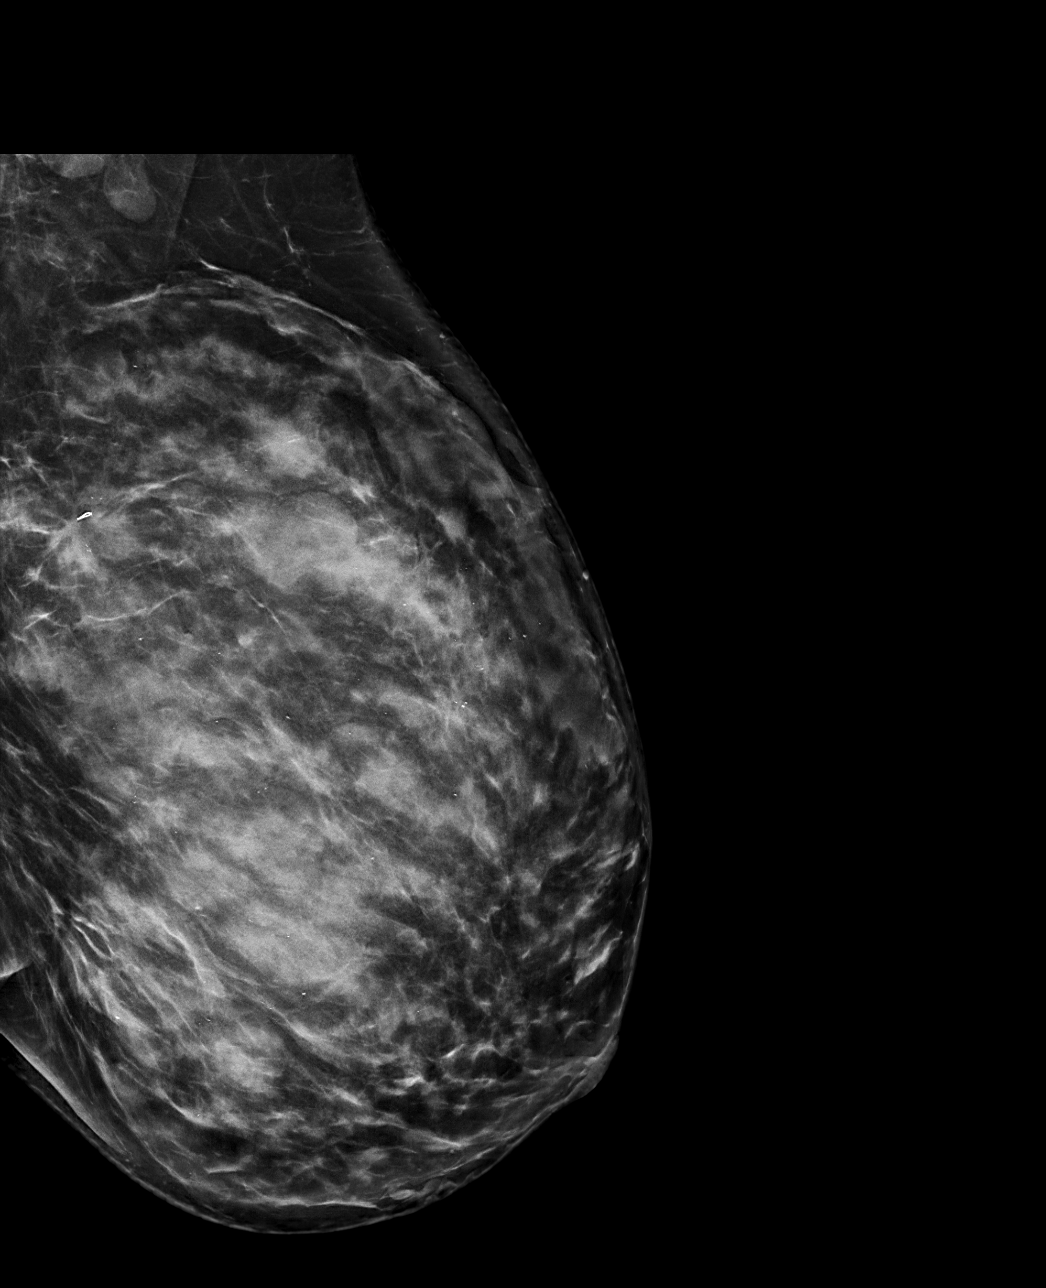

[L XCCL tomo · tomo slice 45/90.0]
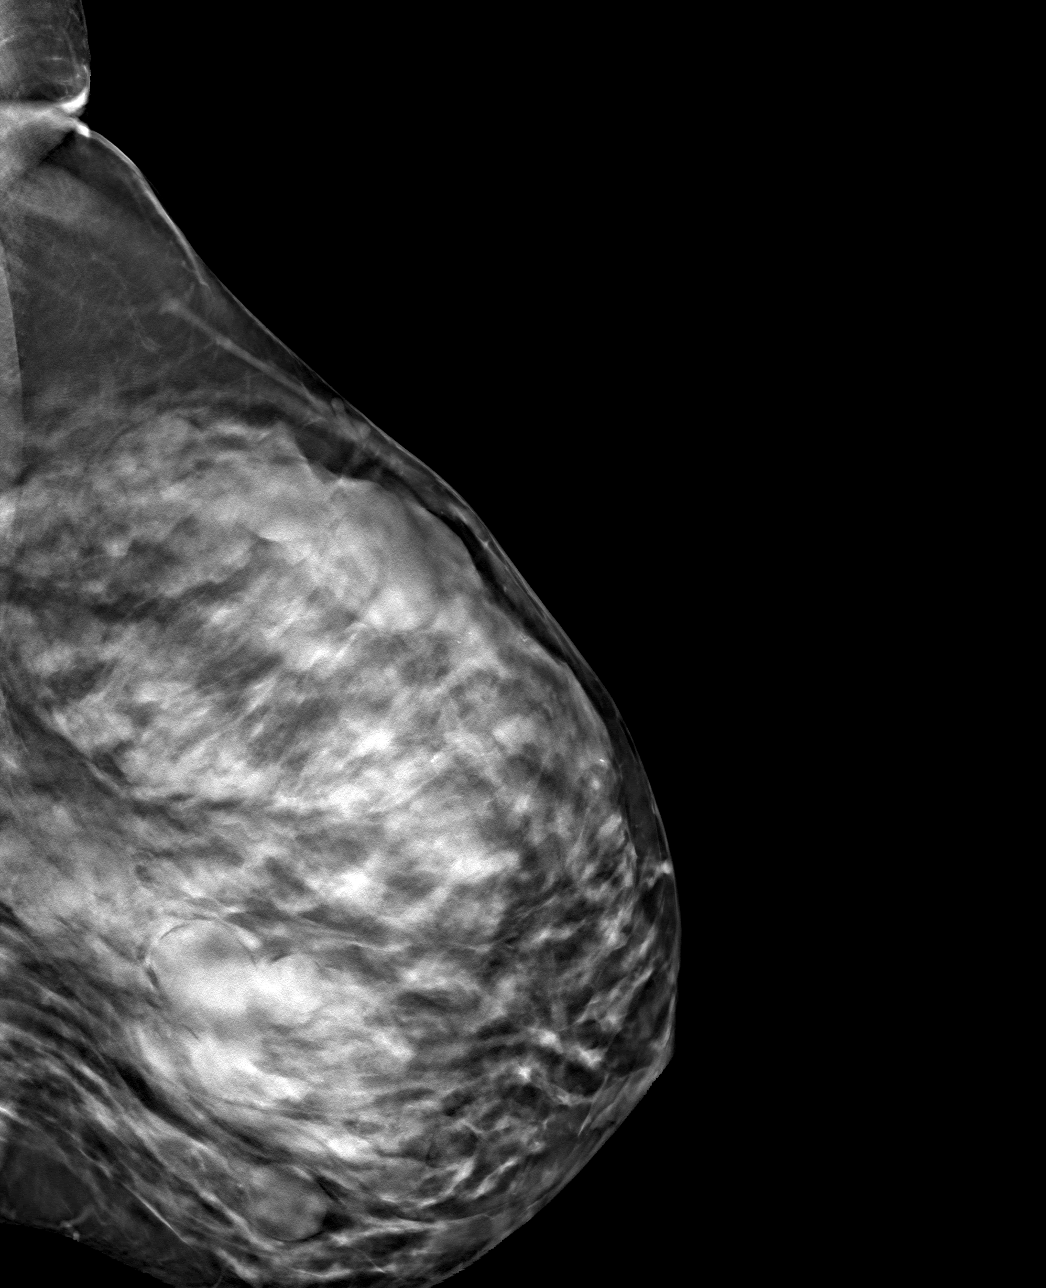

[L ML tomo · tomo slice 47/92.0]
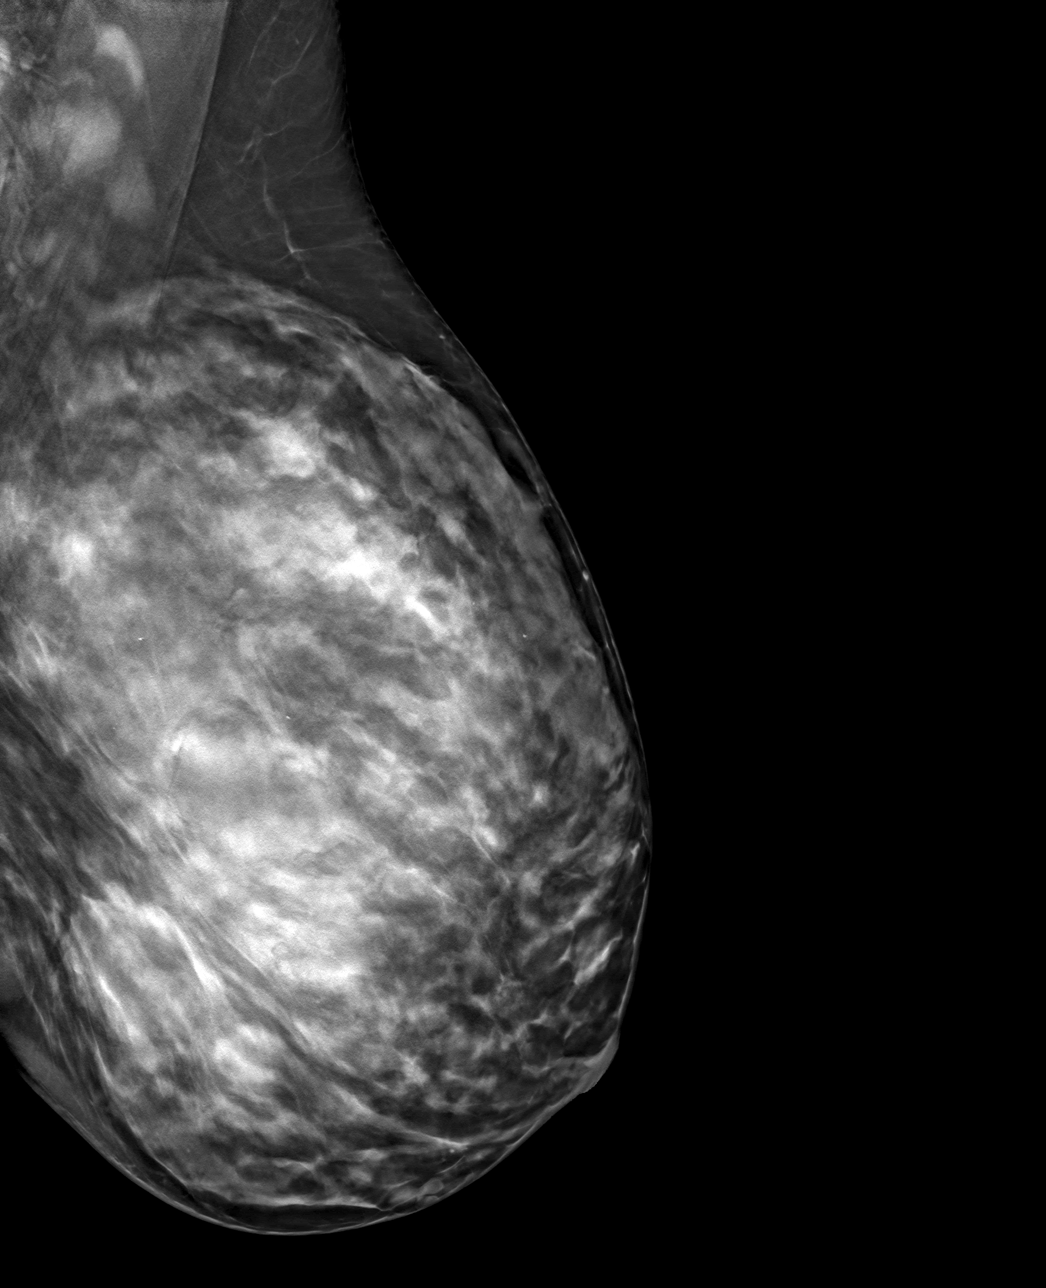

[4 of 12 positions shown; findings below may reference images not displayed]

FINDINGS: 3D Mammographic images were obtained following ultrasound guided
biopsy of left breast mass and left axillary lymph node.

Site 1: Left breast mass 2:30 o'clock: Ribbon shaped clip: In
appropriate position.

Site 2: Left axillary lymph node: Tribell clip: In appropriate
position.
IMPRESSION: Appropriate positioning of the biopsy marking clips as above.

Final Assessment: Post Procedure Mammograms for Marker Placement

## 2021-02-03 ENCOUNTER — Telehealth: Payer: Self-pay | Admitting: Hematology

## 2021-02-03 ENCOUNTER — Other Ambulatory Visit: Payer: Self-pay | Admitting: General Surgery

## 2021-02-03 DIAGNOSIS — Z170 Estrogen receptor positive status [ER+]: Secondary | ICD-10-CM | POA: Diagnosis not present

## 2021-02-03 DIAGNOSIS — C50412 Malignant neoplasm of upper-outer quadrant of left female breast: Secondary | ICD-10-CM | POA: Diagnosis not present

## 2021-02-03 DIAGNOSIS — Z803 Family history of malignant neoplasm of breast: Secondary | ICD-10-CM | POA: Diagnosis not present

## 2021-02-03 DIAGNOSIS — N6001 Solitary cyst of right breast: Secondary | ICD-10-CM | POA: Diagnosis not present

## 2021-02-03 NOTE — Telephone Encounter (Signed)
Scheduled appt per 11/28 staff msg from [employer]. Pt is aware of appts dates and times.

## 2021-02-04 ENCOUNTER — Other Ambulatory Visit: Payer: Self-pay | Admitting: General Surgery

## 2021-02-04 ENCOUNTER — Other Ambulatory Visit: Payer: Self-pay | Admitting: Genetic Counselor

## 2021-02-04 ENCOUNTER — Other Ambulatory Visit (HOSPITAL_COMMUNITY): Payer: Self-pay | Admitting: General Surgery

## 2021-02-04 ENCOUNTER — Telehealth: Payer: Self-pay | Admitting: Hematology

## 2021-02-04 ENCOUNTER — Ambulatory Visit (HOSPITAL_COMMUNITY)
Admission: RE | Admit: 2021-02-04 | Discharge: 2021-02-04 | Disposition: A | Discharge status: Home / Self Care | Payer: Federal, State, Local not specified - PPO | Source: Ambulatory Visit | Attending: General Surgery | Admitting: General Surgery

## 2021-02-04 DIAGNOSIS — C50412 Malignant neoplasm of upper-outer quadrant of left female breast: Secondary | ICD-10-CM | POA: Diagnosis not present

## 2021-02-04 DIAGNOSIS — Z171 Estrogen receptor negative status [ER-]: Secondary | ICD-10-CM | POA: Diagnosis not present

## 2021-02-04 DIAGNOSIS — Z803 Family history of malignant neoplasm of breast: Secondary | ICD-10-CM

## 2021-02-04 DIAGNOSIS — Z170 Estrogen receptor positive status [ER+]: Secondary | ICD-10-CM | POA: Insufficient documentation

## 2021-02-04 DIAGNOSIS — C50912 Malignant neoplasm of unspecified site of left female breast: Secondary | ICD-10-CM | POA: Diagnosis not present

## 2021-02-04 IMAGING — MR MR BREAST BILAT WO/W CM
6 of 10 series · 27 of 48 positions shown · IV contrast (9 ML GADAVIST)
Comparison: Previous exam(s).

CLINICAL DATA: Recent ultrasound-guided core biopsy of mass in the
2:30 o'clock location of the LEFT breast shows grade 2 invasive
ductal carcinoma with ductal carcinoma in situ. Ultrasound-guided
core biopsy of LEFT axillary lymph node shows metastatic carcinoma.

EXAM:
BILATERAL BREAST MRI WITH AND WITHOUT CONTRAST
TECHNIQUE: Multiplanar, multisequence MR images of both breasts were obtained
prior to and following the intravenous administration of 9 ml of
Gadavist

[Series 2: T2 · axial · 3.0mm · 0.91mm/px · 1 of 57 slices shown]
[im 1/57]
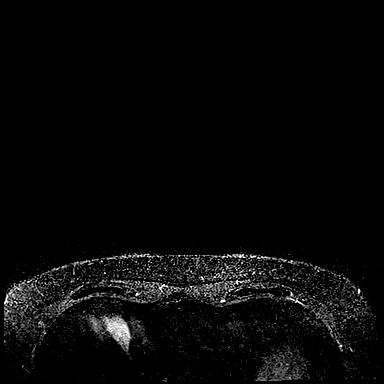

[Series 3: T1 fat-sat · axial · 1.2mm · 0.78mm/px · z∈[-29,+143]mm · 5 of 144 slices shown (1 of 4)]
[im 1/144]
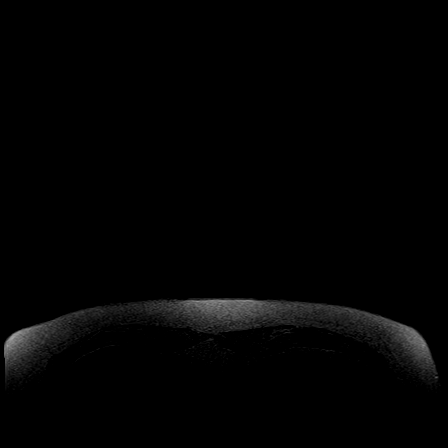
[im 36/144]
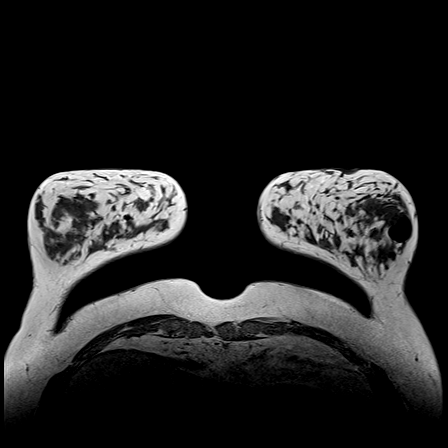
[im 72/144]
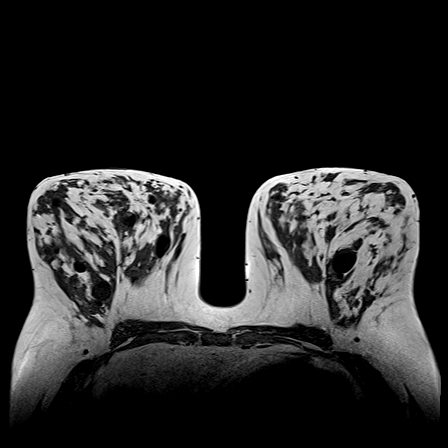
[im 108/144]
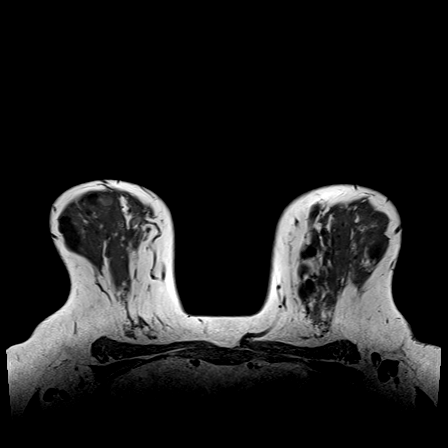
[im 144/144]
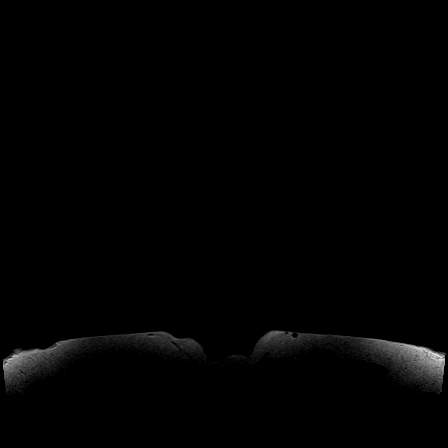

[Series 5: T1 fat-sat · axial · 1.2mm · 0.84mm/px · z∈[-29,+143]mm · 5 of 144 slices shown (2 of 4)]
[im 1/144]
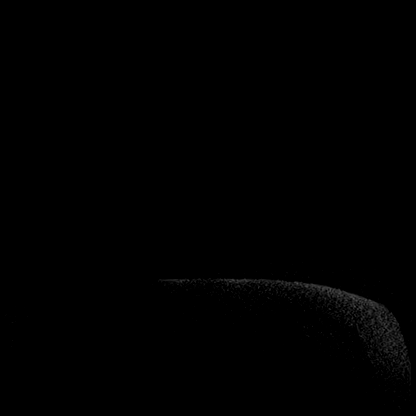
[im 36/144]
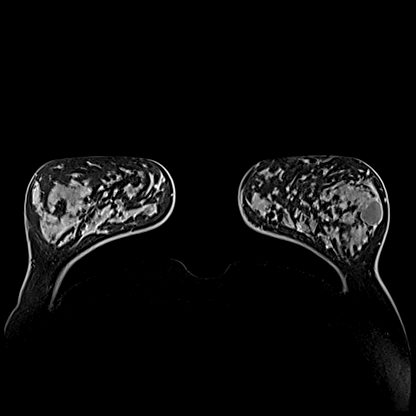
[im 72/144]
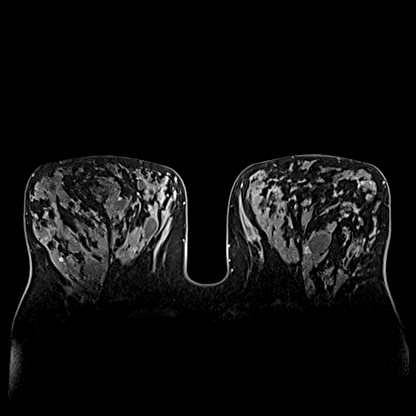
[im 108/144]
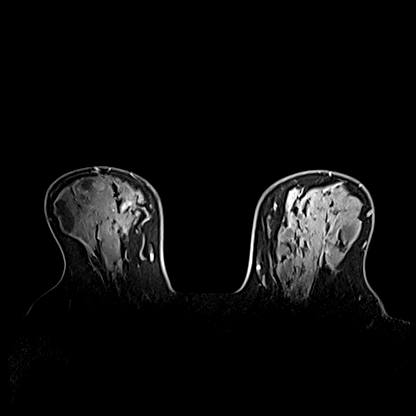
[im 144/144]
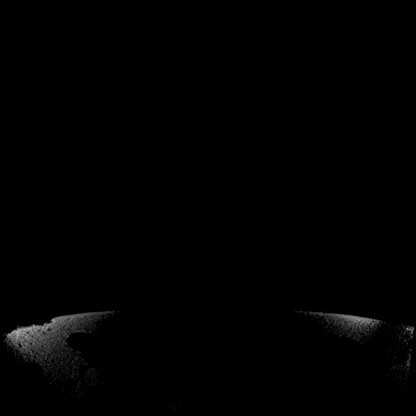

[Series 6: T1 fat-sat · axial · 1.2mm · 0.84mm/px · z∈[-29,+143]mm · 6 of 144 slices shown (3 of 4)]
[im 1/144]
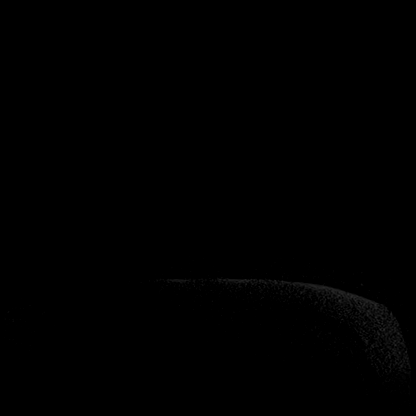
[im 29/144]
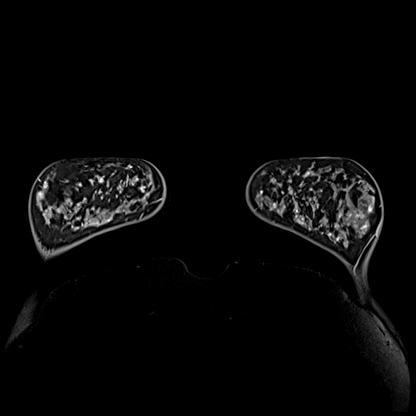
[im 58/144]
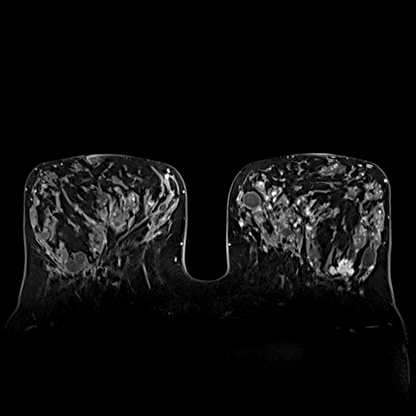
[im 86/144]
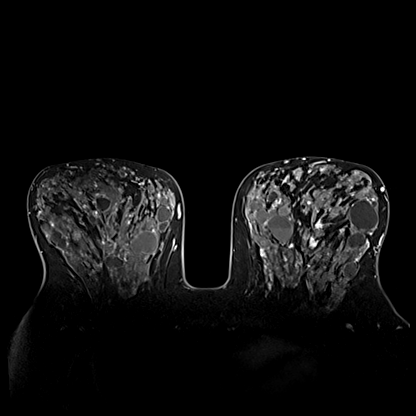
[im 115/144]
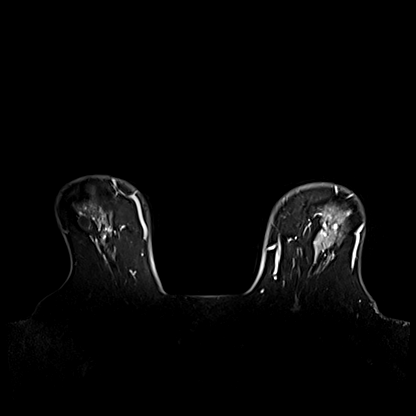
[im 144/144]
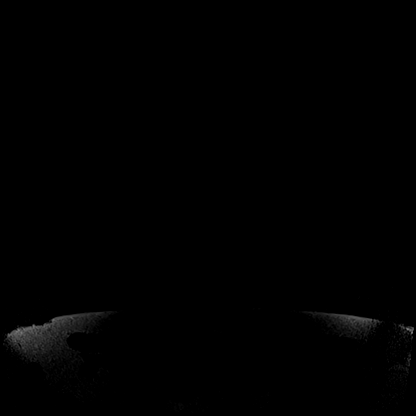

[Series 7: T1 · axial · 1.2mm · 0.84mm/px · z∈[-29,+143]mm · 6 of 144 slices shown]
[im 1/144]
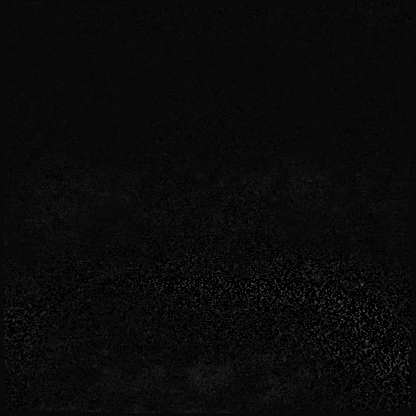
[im 29/144]
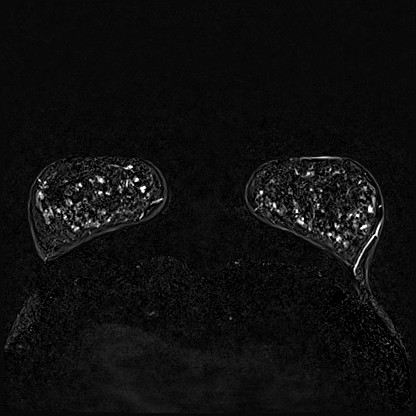
[im 58/144]
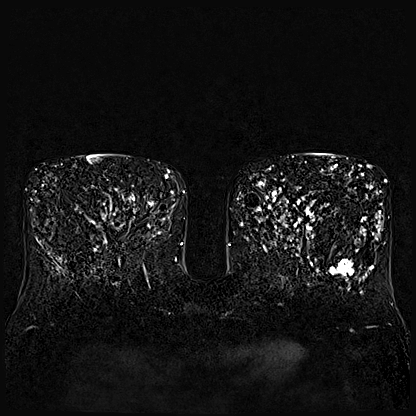
[im 86/144]
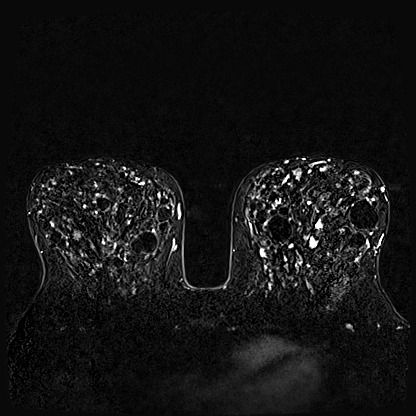
[im 115/144]
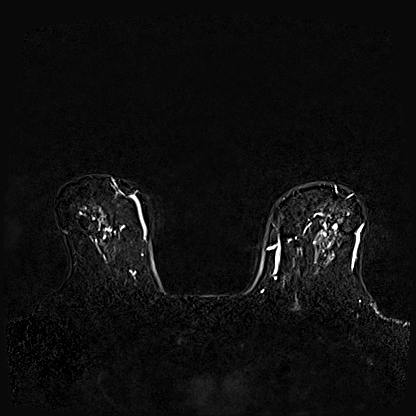
[im 144/144]
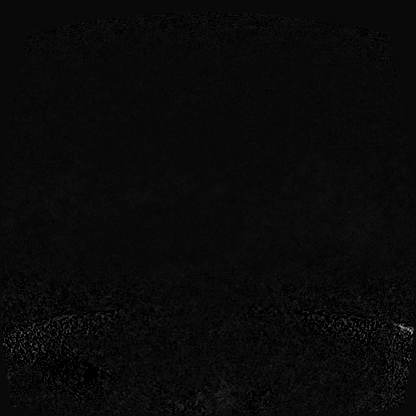

[Series 10: T1 fat-sat · axial · 1.2mm · 0.84mm/px · z∈[-29,+73]mm · 4 of 144 slices shown (4 of 4)]
[im 1/144]
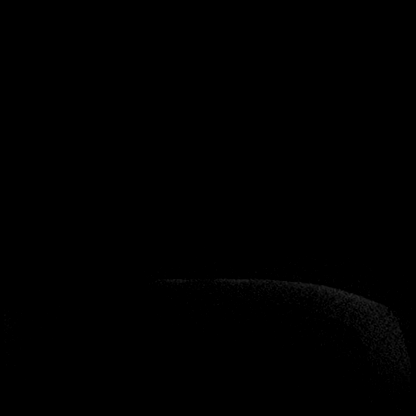
[im 29/144]
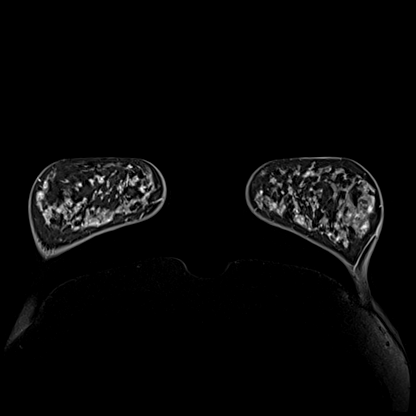
[im 58/144]
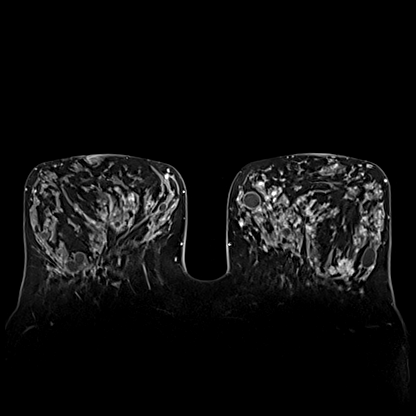
[im 86/144]
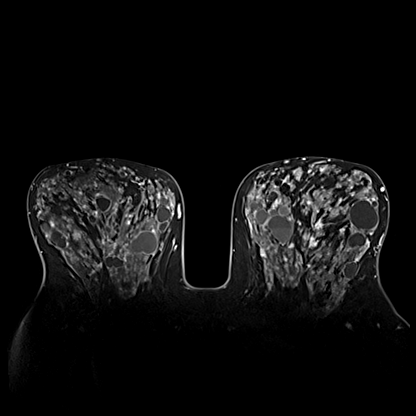

[27 of 48 positions shown; findings below may reference images not displayed]

Three-dimensional MR images were rendered by post-processing of the
original MR data on an independent workstation. The
three-dimensional MR images were interpreted, and findings are
reported in the following complete MRI report for this study. Three
dimensional images were evaluated at the independent interpreting
workstation using the DynaCAD thin client.
FINDINGS: Breast composition: d. Extreme fibroglandular tissue.

Background parenchymal enhancement: Marked

Right breast: No mass or abnormal enhancement. Numerous cysts are
identified throughout the RIGHT breast.

Left breast: No mass or abnormal enhancement. Within the
posterolateral portion of the LEFT breast there is an enhancing
irregular mass with irregular margins. Centrally, tissue marker clip
is identified from recent ultrasound-guided core biopsy which
demonstrated grade 2 invasive ductal carcinoma and ductal carcinoma
in situ. Mass demonstrates washout type enhancement kinetics and
measures 2.3 x 2.3 x 3.0 centimeters.

Within the UPPER central portion of the LEFT breast, there are 2
irregular masses with irregular margins and similar washout type
enhancement kinetics, separated by 5 millimeters of breast tissue.
These masses measure 0.7 x 1.0 x 0.7 centimeters (anterior lesion),
and 0.7 x 0.9 x 0.8 centimeters (posterior lesion). Numerous cysts
are identified throughout the LEFT breast.

Lymph nodes: Within the LEFT/axilla, an enlarged lymph node measures
1.4 x 1.7 centimeters on image 15 of series 2. Within this lymph
node, tissue marker clip artifact is present from recent
ultrasound-guided core biopsy which showed metastatic carcinoma.
Other LEFT axillary lymph nodes have normal morphology.

RIGHT axilla is negative for adenopathy.

Internal mammary regions are negative for adenopathy.

Ancillary findings:  None.
IMPRESSION: 1. Known malignancy in the LATERAL portion of the LEFT breast
measures 3.0 centimeters on MRI.
2. Single enlarged LEFT axillary lymph node represents the recently
biopsied node with metastatic carcinoma.
3. Two additional suspicious masses in the UPPER central LEFT breast
warranting tissue diagnosis. Given the very heterogeneous appearance
of the LEFT breast, with numerous cysts, MR guided core biopsy is
indicated for optimal targeting.
4. RIGHT breast is negative.

RECOMMENDATION:
Recommend MR guided core biopsy of 2 adjacent masses in the UPPER
central LEFT breast.

Treatment plan for known LEFT breast malignancy.

BI-RADS CATEGORY  4: Suspicious.

## 2021-02-04 MED ORDER — GADOBUTROL 1 MMOL/ML IV SOLN
9.0000 mL | Freq: Once | INTRAVENOUS | Status: AC | PRN
  Administered 2021-02-04: 9 mL via INTRAVENOUS

## 2021-02-04 NOTE — Telephone Encounter (Signed)
R/s pt's appts per 11/29 staff msg from [employer]. Pt is aware of appts date and times.

## 2021-02-05 ENCOUNTER — Encounter: Payer: Self-pay | Admitting: Hematology

## 2021-02-05 ENCOUNTER — Inpatient Hospital Stay: Payer: Federal, State, Local not specified - PPO | Admitting: Hematology

## 2021-02-05 ENCOUNTER — Other Ambulatory Visit: Payer: Self-pay

## 2021-02-05 ENCOUNTER — Encounter: Payer: Self-pay | Admitting: Genetic Counselor

## 2021-02-05 ENCOUNTER — Inpatient Hospital Stay: Payer: Federal, State, Local not specified - PPO | Attending: Hematology

## 2021-02-05 ENCOUNTER — Inpatient Hospital Stay (HOSPITAL_BASED_OUTPATIENT_CLINIC_OR_DEPARTMENT_OTHER): Payer: Federal, State, Local not specified - PPO | Admitting: Genetic Counselor

## 2021-02-05 DIAGNOSIS — Z803 Family history of malignant neoplasm of breast: Secondary | ICD-10-CM

## 2021-02-05 DIAGNOSIS — N6002 Solitary cyst of left breast: Secondary | ICD-10-CM | POA: Diagnosis not present

## 2021-02-05 DIAGNOSIS — Z888 Allergy status to other drugs, medicaments and biological substances status: Secondary | ICD-10-CM

## 2021-02-05 DIAGNOSIS — Z836 Family history of other diseases of the respiratory system: Secondary | ICD-10-CM | POA: Insufficient documentation

## 2021-02-05 DIAGNOSIS — Z841 Family history of disorders of kidney and ureter: Secondary | ICD-10-CM | POA: Insufficient documentation

## 2021-02-05 DIAGNOSIS — Z808 Family history of malignant neoplasm of other organs or systems: Secondary | ICD-10-CM | POA: Insufficient documentation

## 2021-02-05 DIAGNOSIS — Z818 Family history of other mental and behavioral disorders: Secondary | ICD-10-CM | POA: Insufficient documentation

## 2021-02-05 DIAGNOSIS — Z823 Family history of stroke: Secondary | ICD-10-CM | POA: Diagnosis not present

## 2021-02-05 DIAGNOSIS — Z79899 Other long term (current) drug therapy: Secondary | ICD-10-CM

## 2021-02-05 DIAGNOSIS — Z170 Estrogen receptor positive status [ER+]: Secondary | ICD-10-CM | POA: Diagnosis not present

## 2021-02-05 DIAGNOSIS — Z87891 Personal history of nicotine dependence: Secondary | ICD-10-CM

## 2021-02-05 DIAGNOSIS — Z811 Family history of alcohol abuse and dependence: Secondary | ICD-10-CM

## 2021-02-05 DIAGNOSIS — Z8249 Family history of ischemic heart disease and other diseases of the circulatory system: Secondary | ICD-10-CM | POA: Diagnosis not present

## 2021-02-05 DIAGNOSIS — Z8349 Family history of other endocrine, nutritional and metabolic diseases: Secondary | ICD-10-CM | POA: Insufficient documentation

## 2021-02-05 DIAGNOSIS — C773 Secondary and unspecified malignant neoplasm of axilla and upper limb lymph nodes: Secondary | ICD-10-CM | POA: Diagnosis not present

## 2021-02-05 DIAGNOSIS — Z833 Family history of diabetes mellitus: Secondary | ICD-10-CM

## 2021-02-05 DIAGNOSIS — R7303 Prediabetes: Secondary | ICD-10-CM | POA: Insufficient documentation

## 2021-02-05 DIAGNOSIS — C50412 Malignant neoplasm of upper-outer quadrant of left female breast: Secondary | ICD-10-CM

## 2021-02-05 HISTORY — DX: Family history of malignant neoplasm of other organs or systems: Z80.8

## 2021-02-05 LAB — CBC WITH DIFFERENTIAL/PLATELET
Abs Immature Granulocytes: 0.08 10*3/uL — ABNORMAL HIGH (ref 0.00–0.07)
Basophils Absolute: 0.1 10*3/uL (ref 0.0–0.1)
Basophils Relative: 1 %
Eosinophils Absolute: 0.2 10*3/uL (ref 0.0–0.5)
Eosinophils Relative: 2 %
HCT: 40.6 % (ref 36.0–46.0)
Hemoglobin: 13.3 g/dL (ref 12.0–15.0)
Immature Granulocytes: 1 %
Lymphocytes Relative: 31 %
Lymphs Abs: 3.6 10*3/uL (ref 0.7–4.0)
MCH: 28.2 pg (ref 26.0–34.0)
MCHC: 32.8 g/dL (ref 30.0–36.0)
MCV: 86 fL (ref 80.0–100.0)
Monocytes Absolute: 0.9 10*3/uL (ref 0.1–1.0)
Monocytes Relative: 7 %
Neutro Abs: 7.1 10*3/uL (ref 1.7–7.7)
Neutrophils Relative %: 58 %
Platelets: 333 10*3/uL (ref 150–400)
RBC: 4.72 MIL/uL (ref 3.87–5.11)
RDW: 13.2 % (ref 11.5–15.5)
WBC: 11.9 10*3/uL — ABNORMAL HIGH (ref 4.0–10.5)
nRBC: 0 % (ref 0.0–0.2)

## 2021-02-05 LAB — CMP (CANCER CENTER ONLY)
ALT: 22 U/L (ref 0–44)
AST: 19 U/L (ref 15–41)
Albumin: 4.2 g/dL (ref 3.5–5.0)
Alkaline Phosphatase: 69 U/L (ref 38–126)
Anion gap: 8 (ref 5–15)
BUN: 15 mg/dL (ref 6–20)
CO2: 23 mmol/L (ref 22–32)
Calcium: 9 mg/dL (ref 8.9–10.3)
Chloride: 105 mmol/L (ref 98–111)
Creatinine: 0.76 mg/dL (ref 0.44–1.00)
GFR, Estimated: >60 mL/min (ref >60)
Glucose, Bld: 89 mg/dL (ref 70–99)
Potassium: 3.8 mmol/L (ref 3.5–5.1)
Sodium: 136 mmol/L (ref 135–145)
Total Bilirubin: 0.2 mg/dL — ABNORMAL LOW (ref 0.3–1.2)
Total Protein: 7.4 g/dL (ref 6.5–8.1)

## 2021-02-05 LAB — GENETIC SCREENING ORDER

## 2021-02-05 NOTE — Progress Notes (Addendum)
Diana Weaver   [phone] Fax:([phone] [place] Note   Patient Care Team: Sueanne Margarita, DO as PCP - General (Internal Medicine) Stark Klein, MD as Consulting Physician (General Surgery) [name], MD as Consulting Physician (Hematology) Mauro Kaufmann, RN as Registered Nurse Rockwell Germany, RN as Registered Nurse  Date of Service:  02/05/2021   CHIEF COMPLAINTS/PURPOSE OF CONSULTATION:  Left Breast Cancer, ER+  REFERRING PHYSICIAN:  Dr. Barry Dienes   ASSESSMENT & PLAN:  Diana Weaver is a 45 y.o. premenopausal female without significant PMH   1. Malignant neoplasm of upper-outer quadrant of left breast, invasive ductal carcinoma, Stage IB, c(T1cN1M0), ER+/PR+/HER2-, Grade 2  -found on screening mammogram. Diagnostic MM and left [place] on 01/14/21 showed 2 cm left breast mass at 2:30, single suspicious lymph node --We discussed her imaging findings and the biopsy results in great details. -Given the early disease, she likely need a lumpectomy. She is agreeable with that. She was seen by Dr. Barry Dienes on 02/03/21 and likely will proceed with surgery soon.  -she had breast MRI yesterday and result is still pending  -I recommend a Mammaprint test on the surgical sample and we'll make a decision about adjuvant chemotherapy based on the Mammaprint result. Written material of this test was given to her. She is young and fit, would be a good candidate for chemotherapy if her Mammaprint shows high risk. --Giving her strongly ER and PR positivity of the tumor cells, and her pre-menopause status, I recommend adjuvant endocrine therapy with tamoxifen. The potential side effects, which includes but not limited to, hot flash, skin and vaginal dryness, slightly increased risk of cardiovascular disease and cataract, small risk of thrombosis and endometrial cancer, were discussed with her in great details.  -We also discussed the use of ovarian suppression  with Zoladex and AI as adjuvant therapy.  Due to her young age, strongly ER/PR positive disease with positive lymph nodes, and no plan for more children, I strongly recommend her to consider ovarian suppression and AI which showed less recurrences then adjuvant tamoxifen.  She voiced good understanding, and agrees to proceed. Will start after she completes adjuvant breast radiation. -I will order baseline labs to be done today with her genetic testing. -We also discussed the breast cancer surveillance after her surgery. She will continue annual screening mammogram, self exam, and a routine office visit with lab and exam with [place].  2. Genetics -she is scheduled for counseling and testing later today.   PLAN:  -proceed with genetic counseling and lab today -return 2-3 weeks after surgery to begin ovarian suppression with Zoladex and discuss mammaprint result    Oncology History Overview Note   Cancer Staging  Malignant neoplasm of upper-outer quadrant of left breast in female, estrogen receptor positive ([place]) Staging form: Breast, AJCC 8th Edition - Clinical stage from 01/21/2021: Stage IB (cT1c, cN1, cM0, G2, ER+, PR+, HER2-) - Signed by [name], MD on 02/04/2021    Malignant neoplasm of upper-outer quadrant of left breast in female, estrogen receptor positive ([place])  01/14/2021 Mammogram   EXAM: DIGITAL DIAGNOSTIC BILATERAL MAMMOGRAM WITH TOMOSYNTHESIS AND CAD; ULTRASOUND LEFT BREAST LIMITED  IMPRESSION: 1. Suspicious left breast mass at the 2:30 position 9 cm from the nipple corresponding with the screening mammographic findings. Recommendation is for ultrasound-guided biopsy. 2. Single, morphologically abnormal left axillary lymph node. Recommendation is for ultrasound-guided biopsy. 3. No persistent, suspicious findings in the right breast.   01/21/2021 Cancer Staging  Staging form: Breast, AJCC 8th Edition - Clinical stage from 01/21/2021: Stage IB (cT1c, cN1, cM0, G2, ER+,  PR+, HER2-) - Signed by [name], MD on 02/04/2021 Stage prefix: Initial diagnosis Histologic grading system: 3 grade system    01/21/2021 Pathology Results   Diagnosis 1. Breast, left, needle core biopsy, 2:30 o'clock, ribbon, 9cmfn - INVASIVE DUCTAL CARCINOMA - DUCTAL CARCINOMA IN SITU - SEE COMMENT 2. Lymph node, needle/core biopsy, left axillary node, tribell - METASTATIC CARCINOMA INVOLVING NODAL TISSUE Microscopic Comment 1. Based on the biopsy, the carcinoma appears Nottingham grade 2 of 3 and measures 1.1 cm in greatest linear extent.  1. PROGNOSTIC INDICATORS Results: The tumor cells are EQUIVOCAL for Her2 (2+). Her2 by FISH will be performed and results reported separately. Estrogen Receptor: 100%, POSITIVE, STRONG STAINING INTENSITY Progesterone Receptor: 100%, POSITIVE, STRONG STAINING INTENSITY Proliferation Marker Ki67: 15% 1. FLUORESCENCE IN-SITU HYBRIDIZATION Results: GROUP 5: HER2 **NEGATIVE**  2. PROGNOSTIC INDICATORS Results: The tumor cells are EQUIVOCAL for Her2 (2+). Her2 by FISH will be performed and results reported separately Estrogen Receptor: 100%, POSITIVE, STRONG STAINING INTENSITY Progesterone Receptor: 100%, POSITIVE, STRONG STAINING INTENSITY 2. FLUORESCENCE IN-SITU HYBRIDIZATION Results: GROUP 5: HER2 **NEGATIVE**   02/04/2021 Initial Diagnosis   Malignant neoplasm of upper-outer quadrant of left breast in female, estrogen receptor positive ([place])      HISTORY OF PRESENTING ILLNESS:  Diana Weaver 45 y.o. female is a here because of breast cancer. The patient was referred by Dr. Barry Dienes. The patient presents to the clinic today alone.   She had routine screening mammography on 12/19/20 showing a possible abnormality in the bilateral breasts. She underwent bilateral diagnostic mammography and left breast ultrasonography on 01/14/21 showing: 2 cm left breast mass at 2:30, single morphologically-abnormal left axillary lymph node; no  persistent, suspicious findings in right breast.  Biopsy on 01/21/21 showed: invasive ductal carcinoma, grade 2; DCIS. Prognostic indicators significant for: estrogen receptor, 100% positive and progesterone receptor, 100% positive. Proliferation marker Ki67 at 15%. HER2 negative by FISH. The biopsied lymph node revealed metastatic carcinoma. Prognostic indicators significant for: estrogen receptor, 100% positive and progesterone receptor, 100% positive. HER2 negative by FISH.   Today the patient notes they felt/feeling prior/after... -she denies any pain or other concerns   She has a PMHx of.... -prediabetes, recently put on metformin -s/p correction of epiglottic stenosis   Socially... 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   GYN HISTORY  Menarchal: xx LMP: 01/23/21 Contraceptive: none HRT: n/a G2P1, first birth at age 13   REVIEW OF SYSTEMS:    Constitutional: Denies fevers, chills or abnormal night sweats Eyes: Denies blurriness of vision, double vision or watery eyes Ears, nose, mouth, throat, and face: Denies mucositis or sore throat Respiratory: Denies cough, dyspnea or wheezes Cardiovascular: Denies palpitation, chest discomfort or lower extremity swelling Gastrointestinal:  Denies nausea, heartburn or change in bowel habits Skin: Denies abnormal skin rashes Lymphatics: Denies new lymphadenopathy or easy bruising Neurological:Denies numbness, tingling or new weaknesses Behavioral/Psych: Mood is stable, no new changes  All other systems were reviewed with the patient and are negative.   MEDICAL HISTORY:  Past Medical History:  Diagnosis Date   Allergy    Betadine, latex, and  pistacios  Breast cancer ([place]) Diagnosed 01/22/2021   Family history of melanoma 02/05/2021   Renal disorder     SURGICAL HISTORY: Past Surgical History:  Procedure Laterality Date   epiglottic stenosis      SOCIAL HISTORY: Social History   Socioeconomic History   Marital status: Married    Spouse name: Not on file   Number of children: 1   Years of education: Not on file   Highest education level: Not on file  Occupational History   Occupation: IT trainer  Tobacco Use   Smoking status: Former    Packs/day: 0.25    Years: 10.00    Pack years: 2.50    Types: Cigarettes    Quit date: 03/09/2009    Years since quitting: 11.9   Smokeless tobacco: Never  Vaping Use   Vaping Use: Never used  Substance and Sexual Activity   Alcohol use: Yes    Comment: 2-4 drinks per year   Drug use: Never   Sexual activity: Yes    Birth control/protection: Condom  Other Topics Concern   Not on file  Social History Narrative   Not on file   Social Determinants of Health   Financial Resource Strain: Not on file  Food Insecurity: Not on file  Transportation Needs: Not on file  Physical Activity: Not on file  Stress: Not on file  Social Connections: Not on file  Intimate Partner Violence: Not on file    FAMILY HISTORY: Family History  Problem Relation Age of Onset   Hypertension Mother    Hyperlipidemia Mother    Melanoma Mother 50       forehead; surgery only   Depression Father    Hyperlipidemia Father    Hypertension Father    Alcohol abuse Father    Early death Father    Asthma Sister    Depression Sister    Diabetes Sister    Kidney disease Sister    Obesity Sister    Alcohol abuse Maternal Grandfather    Stroke Maternal Grandfather    Cancer Maternal Grandfather        oral cancer; d. early 21s   Hodgkin's lymphoma Paternal Grandfather        d. <60?   Throat cancer Cousin        maternal female cousin; dx 22s    ALLERGIES:  is allergic to iodine,  latex, and pistachio nut (diagnostic).  MEDICATIONS:  No current outpatient medications on file.   No current facility-administered medications for this visit.    PHYSICAL EXAMINATION: ECOG PERFORMANCE STATUS: 0 - Asymptomatic  Vitals:   02/05/21 1043  BP: (!) 142/77  Pulse: 78  Resp: 16  Temp: 98.1 F (36.7 C)  SpO2: 98%   Filed Weights   02/05/21 1043  Weight: 200 lb 4.8 oz (90.9 kg)    GENERAL:alert, no distress and comfortable SKIN: skin color, texture, turgor are normal, no rashes or significant lesions EYES: normal, Conjunctiva are pink and non-injected, sclera clear  NECK: supple, thyroid normal size, non-tender, without nodularity LYMPH:  no palpable lymphadenopathy in the cervical, axillary  LUNGS: clear to auscultation and percussion with normal breathing effort HEART: regular rate & rhythm and no murmurs and no lower extremity edema ABDOMEN:abdomen soft, non-tender and normal bowel sounds Musculoskeletal:no cyanosis of digits and no clubbing  NEURO: alert & oriented x 3 with fluent speech, no focal motor/sensory deficits BREAST: palpable cysts throughout both breasts (at least 2 in each breast); primary mass not palpable.  LABORATORY DATA:  I have reviewed the data as listed CBC Latest Ref Rng & Units 02/05/2021 06/10/2020  WBC 4.0 - 10.5 K/uL 11.9(H) 16.1(H)  Hemoglobin 12.0 - 15.0 g/dL 13.3 12.5  Hematocrit 36.0 - 46.0 % 40.6 38.5  Platelets 150 - 400 K/uL 333 283    CMP Latest Ref Rng & Units 02/05/2021 06/10/2020  Glucose 70 - 99 mg/dL 89 115(H)  BUN 6 - 20 mg/dL 15 17  Creatinine 0.44 - 1.00 mg/dL 0.76 1.39(H)  Sodium 135 - 145 mmol/L 136 138  Potassium 3.5 - 5.1 mmol/L 3.8 4.1  Chloride 98 - 111 mmol/L 105 108  CO2 22 - 32 mmol/L 23 23  Calcium 8.9 - 10.3 mg/dL 9.0 8.7(L)  Total Protein 6.5 - 8.1 g/dL 7.4 7.1  Total Bilirubin 0.3 - 1.2 mg/dL 0.2(L) 0.5  Alkaline Phos 38 - 126 U/L 69 63  AST 15 - 41 U/L 19 13(L)  ALT 0 - 44 U/L 22 13      RADIOGRAPHIC STUDIES: I have personally reviewed the radiological images as listed and agreed with the findings in the report. MR BREAST BILATERAL W WO CONTRAST INC CAD  Result Date: 02/05/2021 CLINICAL DATA:  Recent ultrasound-guided core biopsy of mass in the 2:30 o'clock location of the LEFT breast shows grade 2 invasive ductal carcinoma with ductal carcinoma in situ. Ultrasound-guided core biopsy of LEFT axillary lymph node shows metastatic carcinoma. EXAM: BILATERAL BREAST MRI WITH AND WITHOUT CONTRAST TECHNIQUE: Multiplanar, multisequence MR images of both breasts were obtained prior to and following the intravenous administration of 9 ml of Gadavist Three-dimensional MR images were rendered by post-processing of the original MR data on an independent workstation. The three-dimensional MR images were interpreted, and findings are reported in the following complete MRI report for this study. Three dimensional images were evaluated at the independent interpreting workstation using the DynaCAD thin client. COMPARISON:  Previous exam(s). FINDINGS: Breast composition: d. Extreme fibroglandular tissue. Background parenchymal enhancement: Marked Right breast: No mass or abnormal enhancement. Numerous cysts are identified throughout the RIGHT breast. Left breast: No mass or abnormal enhancement. Within the posterolateral portion of the LEFT breast there is an enhancing irregular mass with irregular margins. Centrally, tissue marker clip is identified from recent ultrasound-guided core biopsy which demonstrated grade 2 invasive ductal carcinoma and ductal carcinoma in situ. Mass demonstrates washout type enhancement kinetics and measures 2.3 x 2.3 x 3.0 centimeters. Within the UPPER central portion of the LEFT breast, there are 2 irregular masses with irregular margins and similar washout type enhancement kinetics, separated by 5 millimeters of breast tissue. These masses measure 0.7 x 1.0 x 0.7  centimeters (anterior lesion), and 0.7 x 0.9 x 0.8 centimeters (posterior lesion). Numerous cysts are identified throughout the LEFT breast. Lymph nodes: Within the LEFT/axilla, an enlarged lymph node measures 1.4 x 1.7 centimeters on image 15 of series 2. Within this lymph node, tissue marker clip artifact is present from recent ultrasound-guided core biopsy which showed metastatic carcinoma. Other LEFT axillary lymph nodes have normal morphology. RIGHT axilla is negative for adenopathy. Internal mammary regions are negative for adenopathy. Ancillary findings:  None. IMPRESSION: 1. Known malignancy in the LATERAL portion of the LEFT breast measures 3.0 centimeters on MRI. 2. Single enlarged LEFT axillary lymph node represents the recently biopsied node with metastatic carcinoma. 3. Two additional suspicious masses in the UPPER central LEFT breast warranting tissue diagnosis. Given the very heterogeneous appearance of the LEFT breast, with numerous cysts, MR guided core biopsy  is indicated for optimal targeting. 4. RIGHT breast is negative. RECOMMENDATION: Recommend MR guided core biopsy of 2 adjacent masses in the UPPER central LEFT breast. Treatment plan for known LEFT breast malignancy. BI-RADS CATEGORY  4: Suspicious. Electronically Signed   By: Nolon Nations M.D.   On: 02/05/2021 11:05  US BREAST LTD UNI LEFT INC AXILLA  Result Date: 01/14/2021 CLINICAL DATA:  45 year old female recalled from screening mammogram dated 12/19/2020 for possible right breast asymmetry and possible left breast mass with this distortion. EXAM: DIGITAL DIAGNOSTIC BILATERAL MAMMOGRAM WITH TOMOSYNTHESIS AND CAD; ULTRASOUND LEFT BREAST LIMITED TECHNIQUE: Bilateral digital diagnostic mammography and breast tomosynthesis was performed. The images were evaluated with computer-aided detection.; Targeted ultrasound examination of the left breast was performed. COMPARISON:  Previous exam(s). ACR Breast Density Category d: The breast  tissue is extremely dense, which lowers the sensitivity of mammography. FINDINGS: Additional spot tomosynthesis views of the far upper outer quadrant of the right breast demonstrates no persistent asymmetry. Note is made of layering calcifications consistent with benign milk of calcium and numerous circumscribed cysts scattered throughout the right breast. There is a persistent irregular, spiculated mass in the upper-outer quadrant of the left breast at posterior depth. Further evaluation with ultrasound was performed. Again, numerous circumscribed cysts are scattered throughout the left breast. Targeted ultrasound is performed, showing an irregular, hypoechoic mass with associated vascularity at the 2:30 position 9 cm from the nipple. It measures 2.0 x 1.8 x 1.7 cm. This correlates well with the mammographic finding. Evaluation of the left axilla demonstrates a single morphologically abnormal lymph node with diffuse cortical thickening up to 6 mm. IMPRESSION: 1. Suspicious left breast mass at the 2:30 position 9 cm from the nipple corresponding with the screening mammographic findings. Recommendation is for ultrasound-guided biopsy. 2. Single, morphologically abnormal left axillary lymph node. Recommendation is for ultrasound-guided biopsy. 3. No persistent, suspicious findings in the right breast. RECOMMENDATION: 1. Two area ultrasound-guided biopsy of the left breast and left axilla. 2. Pending return of pathology results, recommend further evaluation with contrast enhanced MRI given the patient's extreme breast density and cystic pattern. I have discussed the findings and recommendations with the patient. If applicable, a reminder letter will be sent to the patient regarding the next appointment. BI-RADS CATEGORY  5: Highly suggestive of malignancy. Electronically Signed   By: Kristopher Oppenheim M.D.   On: 01/14/2021 09:48  MM DIAG BREAST TOMO BILATERAL  Result Date: 01/14/2021 CLINICAL DATA:  45 year old female  recalled from screening mammogram dated 12/19/2020 for possible right breast asymmetry and possible left breast mass with this distortion. EXAM: DIGITAL DIAGNOSTIC BILATERAL MAMMOGRAM WITH TOMOSYNTHESIS AND CAD; ULTRASOUND LEFT BREAST LIMITED TECHNIQUE: Bilateral digital diagnostic mammography and breast tomosynthesis was performed. The images were evaluated with computer-aided detection.; Targeted ultrasound examination of the left breast was performed. COMPARISON:  Previous exam(s). ACR Breast Density Category d: The breast tissue is extremely dense, which lowers the sensitivity of mammography. FINDINGS: Additional spot tomosynthesis views of the far upper outer quadrant of the right breast demonstrates no persistent asymmetry. Note is made of layering calcifications consistent with benign milk of calcium and numerous circumscribed cysts scattered throughout the right breast. There is a persistent irregular, spiculated mass in the upper-outer quadrant of the left breast at posterior depth. Further evaluation with ultrasound was performed. Again, numerous circumscribed cysts are scattered throughout the left breast. Targeted ultrasound is performed, showing an irregular, hypoechoic mass with associated vascularity at the 2:30 position 9 cm from the nipple. It  measures 2.0 x 1.8 x 1.7 cm. This correlates well with the mammographic finding. Evaluation of the left axilla demonstrates a single morphologically abnormal lymph node with diffuse cortical thickening up to 6 mm. IMPRESSION: 1. Suspicious left breast mass at the 2:30 position 9 cm from the nipple corresponding with the screening mammographic findings. Recommendation is for ultrasound-guided biopsy. 2. Single, morphologically abnormal left axillary lymph node. Recommendation is for ultrasound-guided biopsy. 3. No persistent, suspicious findings in the right breast. RECOMMENDATION: 1. Two area ultrasound-guided biopsy of the left breast and left axilla. 2.  Pending return of pathology results, recommend further evaluation with contrast enhanced MRI given the patient's extreme breast density and cystic pattern. I have discussed the findings and recommendations with the patient. If applicable, a reminder letter will be sent to the patient regarding the next appointment. BI-RADS CATEGORY  5: Highly suggestive of malignancy. Electronically Signed   By: Kristopher Oppenheim M.D.   On: 01/14/2021 09:48  [place] AXILLARY NODE CORE BIOPSY LEFT  Addendum Date: 01/23/2021   ADDENDUM REPORT: 01/23/2021 08:15 ADDENDUM: Pathology revealed GRADE II INVASIVE DUCTAL CARCINOMA, DUCTAL CARCINOMA IN SITU of the LEFT breast, 2:30 o'clock, 9cmfn, (ribbon clip). This was found to be concordant by Dr. Lovey Newcomer. Pathology revealed METASTATIC CARCINOMA INVOLVING NODAL TISSUE of the LEFT axilla, (tribell clip). This was found to be concordant by Dr. Lovey Newcomer. Pathology results were discussed with the patient by telephone. The patient reported doing well after the biopsies with tenderness and bruising at the sites. Post biopsy instructions and care were reviewed and questions were answered. The patient was encouraged to call [facility] for any additional concerns. My direct phone number was provided. Surgical consultation has been arranged with Dr. Stark Klein at [facility] on February 03, 2021. Pathology results reported by Terie Purser, RN on 01/22/2021. Electronically Signed   By: Lovey Newcomer M.D.   On: 01/23/2021 08:15   Result Date: 01/23/2021 CLINICAL DATA:  Patient with suspicious left breast mass and thickened left axillary lymph node. EXAM: ULTRASOUND GUIDED LEFT BREAST CORE NEEDLE BIOPSY COMPARISON:  Previous exam(s). PROCEDURE: I met with the patient and we discussed the procedure of ultrasound-guided biopsy, including benefits and alternatives. We discussed the high likelihood of a successful procedure. We discussed the risks of the  procedure, including infection, bleeding, tissue injury, clip migration, and inadequate sampling. Informed written consent was given. The usual time-out protocol was performed immediately prior to the procedure. Site 1: Left breast mass 2:30 o'clock Lesion quadrant: Upper outer quadrant Using sterile technique and 1% Lidocaine as local anesthetic, under direct ultrasound visualization, a 14 gauge spring-loaded device was used to perform biopsy of left breast mass 2:30 o'clock using a lateral approach. At the conclusion of the procedure ribbon shaped tissue marker clip was deployed into the biopsy cavity. Follow up 2 view mammogram was performed and dictated separately. Site 2: Left axillary lymph node. Using sterile technique and 1% Lidocaine as local anesthetic, under direct ultrasound visualization, a 14 gauge spring-loaded device was used to perform biopsy of left axillary lymph node using a lateral approach. At the conclusion of the procedure tribell tissue marker clip was deployed into the biopsy cavity. Follow up 2 view mammogram was performed and dictated separately. IMPRESSION: Ultrasound guided biopsy of left breast mass and left axillary lymph node. No apparent complications. Electronically Signed: By: Lovey Newcomer M.D. On: 01/21/2021 09:37  MM CLIP PLACEMENT LEFT  Result Date: 01/21/2021 CLINICAL DATA:  Patient status post ultrasound-guided biopsy left breast mass and left axillary lymph node EXAM: 3D DIAGNOSTIC LEFT MAMMOGRAM POST ULTRASOUND BIOPSY COMPARISON:  Previous exam(s). FINDINGS: 3D Mammographic images were obtained following ultrasound guided biopsy of left breast mass and left axillary lymph node. Site 1: Left breast mass 2:30 o'clock: Ribbon shaped clip: In appropriate position. Site 2: Left axillary lymph node: Tribell clip: In appropriate position. IMPRESSION: Appropriate positioning of the biopsy marking clips as above. Final Assessment: Post Procedure Mammograms for Marker Placement  Electronically Signed   By: Annia Belt M.D.   On: 01/21/2021 09:38  [place] LT BREAST BX W LOC DEV 1ST LESION IMG BX SPEC US GUIDE  Addendum Date: 01/23/2021   ADDENDUM REPORT: 01/23/2021 08:15 ADDENDUM: Pathology revealed GRADE II INVASIVE DUCTAL CARCINOMA, DUCTAL CARCINOMA IN SITU of the LEFT breast, 2:30 o'clock, 9cmfn, (ribbon clip). This was found to be concordant by Dr. Annia Belt. Pathology revealed METASTATIC CARCINOMA INVOLVING NODAL TISSUE of the LEFT axilla, (tribell clip). This was found to be concordant by Dr. Annia Belt. Pathology results were discussed with the patient by telephone. The patient reported doing well after the biopsies with tenderness and bruising at the sites. Post biopsy instructions and care were reviewed and questions were answered. The patient was encouraged to call [facility] for any additional concerns. My direct phone number was provided. Surgical consultation has been arranged with Dr. Almond Lint at [facility] on February 03, 2021. Pathology results reported by Rene Kocher, RN on 01/22/2021. Electronically Signed   By: Annia Belt M.D.   On: 01/23/2021 08:15   Result Date: 01/23/2021 CLINICAL DATA:  Patient with suspicious left breast mass and thickened left axillary lymph node. EXAM: ULTRASOUND GUIDED LEFT BREAST CORE NEEDLE BIOPSY COMPARISON:  Previous exam(s). PROCEDURE: I met with the patient and we discussed the procedure of ultrasound-guided biopsy, including benefits and alternatives. We discussed the high likelihood of a successful procedure. We discussed the risks of the procedure, including infection, bleeding, tissue injury, clip migration, and inadequate sampling. Informed written consent was given. The usual time-out protocol was performed immediately prior to the procedure. Site 1: Left breast mass 2:30 o'clock Lesion quadrant: Upper outer quadrant Using sterile technique and 1% Lidocaine as local anesthetic, under  direct ultrasound visualization, a 14 gauge spring-loaded device was used to perform biopsy of left breast mass 2:30 o'clock using a lateral approach. At the conclusion of the procedure ribbon shaped tissue marker clip was deployed into the biopsy cavity. Follow up 2 view mammogram was performed and dictated separately. Site 2: Left axillary lymph node. Using sterile technique and 1% Lidocaine as local anesthetic, under direct ultrasound visualization, a 14 gauge spring-loaded device was used to perform biopsy of left axillary lymph node using a lateral approach. At the conclusion of the procedure tribell tissue marker clip was deployed into the biopsy cavity. Follow up 2 view mammogram was performed and dictated separately. IMPRESSION: Ultrasound guided biopsy of left breast mass and left axillary lymph node. No apparent complications. Electronically Signed: By: Annia Belt M.D. On: 01/21/2021 09:37    Orders Placed This Encounter  Procedures   Cancer antigen 27.29   CBC with Differential/Platelet    Standing Status:   Standing    Number of Occurrences:   50    Standing Expiration Date:   02/05/2022   Comprehensive metabolic panel    Standing Status:   Standing    Number of Occurrences:   50  Standing Expiration Date:   02/05/2022     All questions were answered. The patient knows to call the clinic with any problems, questions or concerns. The total time spent in the appointment was 50 minutes.     [name], MD 02/05/2021   I, Wilburn Mylar, am acting as scribe for [name], MD.   I have reviewed the above documentation for accuracy and completeness, and I agree with the above.

## 2021-02-05 NOTE — Progress Notes (Signed)
REFERRING PROVIDER: Stark Klein, MD [address] [place],  [place] [address]  PRIMARY PROVIDER:  Sueanne Margarita, DO  PRIMARY REASON FOR VISIT:  1. Malignant neoplasm of upper-outer quadrant of left breast in female, estrogen receptor positive ([place])   2. Family history of melanoma    HISTORY OF PRESENT ILLNESS:   Diana Weaver, a 45 y.o. female, was seen for a [place] cancer genetics consultation at the request of Dr. Barry Dienes due to a personal history of breast cancer.  Diana Weaver presents to clinic today to discuss the possibility of a hereditary predisposition to cancer, to discuss genetic testing, and to further clarify her future cancer risks, as well as potential cancer risks for family members.   In November 2022, at the age of 88, Diana Weaver was diagnosed with invasive ductal carcinoma of the left breast (ER+/PR+/HER2-). The treatment plan is pending.   CANCER HISTORY:  Oncology History Overview Note   Cancer Staging  Malignant neoplasm of upper-outer quadrant of left breast in female, estrogen receptor positive ([place]) Staging form: Breast, AJCC 8th Edition - Clinical stage from 01/21/2021: Stage IB (cT1c, cN1, cM0, G2, ER+, PR+, HER2-) - Signed by [name], MD on 02/04/2021    Malignant neoplasm of upper-outer quadrant of left breast in female, estrogen receptor positive ([place])  01/14/2021 Mammogram   EXAM: DIGITAL DIAGNOSTIC BILATERAL MAMMOGRAM WITH TOMOSYNTHESIS AND CAD; ULTRASOUND LEFT BREAST LIMITED  IMPRESSION: 1. Suspicious left breast mass at the 2:30 position 9 cm from the nipple corresponding with the screening mammographic findings. Recommendation is for ultrasound-guided biopsy. 2. Single, morphologically abnormal left axillary lymph node. Recommendation is for ultrasound-guided biopsy. 3. No persistent, suspicious findings in the right breast.   01/21/2021 Cancer Staging   Staging form: Breast, AJCC 8th Edition - Clinical stage from  01/21/2021: Stage IB (cT1c, cN1, cM0, G2, ER+, PR+, HER2-) - Signed by [name], MD on 02/04/2021 Stage prefix: Initial diagnosis Histologic grading system: 3 grade system    01/21/2021 Pathology Results   Diagnosis 1. Breast, left, needle core biopsy, 2:30 o'clock, ribbon, 9cmfn - INVASIVE DUCTAL CARCINOMA - DUCTAL CARCINOMA IN SITU - SEE COMMENT 2. Lymph node, needle/core biopsy, left axillary node, tribell - METASTATIC CARCINOMA INVOLVING NODAL TISSUE Microscopic Comment 1. Based on the biopsy, the carcinoma appears Nottingham grade 2 of 3 and measures 1.1 cm in greatest linear extent.  1. PROGNOSTIC INDICATORS Results: The tumor cells are EQUIVOCAL for Her2 (2+). Her2 by FISH will be performed and results reported separately. Estrogen Receptor: 100%, POSITIVE, STRONG STAINING INTENSITY Progesterone Receptor: 100%, POSITIVE, STRONG STAINING INTENSITY Proliferation Marker Ki67: 15% 1. FLUORESCENCE IN-SITU HYBRIDIZATION Results: GROUP 5: HER2 **NEGATIVE**  2. PROGNOSTIC INDICATORS Results: The tumor cells are EQUIVOCAL for Her2 (2+). Her2 by FISH will be performed and results reported separately Estrogen Receptor: 100%, POSITIVE, STRONG STAINING INTENSITY Progesterone Receptor: 100%, POSITIVE, STRONG STAINING INTENSITY 2. FLUORESCENCE IN-SITU HYBRIDIZATION Results: GROUP 5: HER2 **NEGATIVE**   02/04/2021 Initial Diagnosis   Malignant neoplasm of upper-outer quadrant of left breast in female, estrogen receptor positive ([place])     RISK FACTORS:  Menarche was at age 70.  First live birth at age 74.  OCP use for more than  20  years.  Ovaries intact: yes.  Hysterectomy: no.  Menopausal status: premenopausal.  HRT use: 0 years. Colonoscopy: no; not examined. Mammogram within the last year: yes. Number of breast biopsies: 0. Up to date with pelvic exams: yes. Any excessive radiation exposure in the past:  no  Past Medical History:  Diagnosis Date   Allergy     Betadine, latex, and pistacios   Breast cancer ([place]) Diagnosed 01/22/2021   Family history of melanoma 02/05/2021   Renal disorder     Past Surgical History:  Procedure Laterality Date   epiglottic stenosis      Social History   Socioeconomic History   Marital status: Married    Spouse name: Not on file   Number of children: 1   Years of education: Not on file   Highest education level: Not on file  Occupational History   Occupation: IT trainer  Tobacco Use   Smoking status: Former    Packs/day: 0.25    Years: 10.00    Pack years: 2.50    Types: Cigarettes    Quit date: 03/09/2009    Years since quitting: 11.9   Smokeless tobacco: Never  Vaping Use   Vaping Use: Never used  Substance and Sexual Activity   Alcohol use: Yes    Comment: 2-4 drinks per year   Drug use: Never   Sexual activity: Yes    Birth control/protection: Condom  Other Topics Concern   Not on file  Social History Narrative   Not on file   Social Determinants of Health   Financial Resource Strain: Not on file  Food Insecurity: Not on file  Transportation Needs: Not on file  Physical Activity: Not on file  Stress: Not on file  Social Connections: Not on file     FAMILY HISTORY:  We obtained a detailed, 4-generation family history.  Significant diagnoses are listed below: Family History  Problem Relation Age of Onset   Melanoma Mother 29       forehead; surgery only   Cancer Maternal Grandfather        oral cancer; d. early 79s   Hodgkin's lymphoma Paternal Grandfather        d. <60?   Throat cancer Cousin        maternal female cousin; dx 42s    Diana Weaver is unaware of previous family history of genetic testing for hereditary cancer risks. Patient's maternal ancestors are of [place] descent, and paternal ancestors are of Native American descent. There is no reported Ashkenazi Jewish ancestry. There is no known consanguinity.  GENETIC COUNSELING ASSESSMENT: Diana Weaver is a 45  y.o. female with a personal history of cancer which is somewhat suggestive of a hereditary cancer syndrome and predisposition to cancer given her age of diagnosis. We, therefore, discussed and recommended the following at today's visit.   DISCUSSION: We discussed that 5 - 10% of cancer is hereditary, with most cases of hereditary breast cancer associated with mutations in BRCA1/2.  There are other genes that can be associated with hereditary breast or melanoma cancer syndromes.  Type of cancer risk and level of risk are gene-specific. We discussed that testing is beneficial for several reasons including knowing how to follow individuals after completing their treatment, identifying whether potential treatment options would be beneficial, and understanding if other family members could be at risk for cancer and allowing them to undergo genetic testing.   We reviewed the characteristics, features and inheritance patterns of hereditary cancer syndromes. We also discussed genetic testing, including the appropriate family members to test, the process of testing, insurance coverage and turn-around-time for results. We discussed the implications of a negative, positive and/or variant of uncertain significant result. In order to get genetic test results in a timely manner so  that Diana Weaver can use these genetic test results for surgical decisions, we recommended Diana Weaver pursue genetic testing for the [employer].  The BRCAplus panel offered by [employer] and includes sequencing and deletion/duplication analysis for the following 8 genes: ATM, BRCA1, BRCA2, CDH1, CHEK2, PALB2, PTEN, and TP53.Once complete, we recommend Diana Weaver pursue reflex genetic testing to a more comprehensive gene panel.   The CancerNext-Expanded gene panel offered by [facility] and includes sequencing, rearrangement, and RNA analysis for the following 77 genes: AIP, ALK, APC, ATM, AXIN2, BAP1, BARD1, BLM, BMPR1A,  BRCA1, BRCA2, BRIP1, CDC73, CDH1, CDK4, CDKN1B, CDKN2A, CHEK2, CTNNA1, DICER1, FANCC, FH, FLCN, GALNT12, KIF1B, LZTR1, MAX, MEN1, MET, MLH1, MSH2, MSH3, MSH6, MUTYH, NBN, NF1, NF2, NTHL1, PALB2, PHOX2B, PMS2, POT1, PRKAR1A, PTCH1, PTEN, RAD51C, RAD51D, RB1, RECQL, RET, SDHA, SDHAF2, SDHB, SDHC, SDHD, SMAD4, SMARCA4, SMARCB1, SMARCE1, STK11, SUFU, TMEM127, TP53, TSC1, TSC2, VHL and XRCC2 (sequencing and deletion/duplication); EGFR, EGLN1, HOXB13, KIT, MITF, PDGFRA, POLD1, and POLE (sequencing only); EPCAM and GREM1 (deletion/duplication only).   Based on Diana Weaver's personal history of breast cancer, she meets medical criteria for genetic testing. Despite that she meets criteria, she may still have an out of pocket cost. We discussed that if her out of pocket cost for testing is over $100, the laboratory should contact them to discuss self-pay prices, patient pay assistance programs, if applicable, and other billing options.   PLAN: After considering the risks, benefits, and limitations, Diana Weaver provided informed consent to pursue genetic testing and the blood sample was sent to [facility] for analysis of the BRCAPlus and CancerNext-Expanded +RNAinsight Panel. Results should be available within approximately 1-2 weeks' time, at which point they will be disclosed by telephone to Diana Weaver, as will any additional recommendations warranted by these results. Diana Weaver will receive a summary of her genetic counseling visit and a copy of her results once available. This information will also be available in Epic.    Lastly, we encouraged Diana Weaver to remain in contact with cancer genetics annually so that we can continuously update the family history and inform her of any changes in cancer genetics and testing that may be of benefit for this family.   Diana Weaver questions were answered to her satisfaction today. Our contact information was provided should additional questions or  concerns arise. Thank you for the referral and allowing [place] to share in the care of your patient.   Kasiya Burck M. Joette Catching, [place], [facility] Genetic Counselor [email or web address] (P) [phone]  The patient was seen for a total of 25 minutes in face-to-face genetic counseling.  The patient was seen alone.  Drs. Magrinat, Lindi Adie and/or Burr Medico were available to discuss this case as needed.  _______________________________________________________________________ For Office Staff:  Number of people involved in session: 1 Was an Intern/ student involved with case: no

## 2021-02-06 ENCOUNTER — Other Ambulatory Visit: Payer: Self-pay | Admitting: General Surgery

## 2021-02-06 DIAGNOSIS — R9389 Abnormal findings on diagnostic imaging of other specified body structures: Secondary | ICD-10-CM

## 2021-02-06 LAB — CANCER ANTIGEN 27.29: CA 27.29: 33.3 U/mL (ref 0.0–38.6)

## 2021-02-07 ENCOUNTER — Encounter: Payer: Self-pay | Admitting: *Deleted

## 2021-02-07 ENCOUNTER — Telehealth: Payer: Self-pay | Admitting: *Deleted

## 2021-02-07 ENCOUNTER — Other Ambulatory Visit: Payer: Self-pay | Admitting: *Deleted

## 2021-02-07 DIAGNOSIS — C50412 Malignant neoplasm of upper-outer quadrant of left female breast: Secondary | ICD-10-CM

## 2021-02-07 NOTE — Telephone Encounter (Signed)
Spoke with patient to follow up from new patient appt and assess navigation needs. Patient denies and questions or concerns at this time.  Contact information given and encouraged her to call should anything arise. Patient verbalized understanding.

## 2021-02-09 ENCOUNTER — Encounter: Payer: Self-pay | Admitting: Hematology

## 2021-02-13 ENCOUNTER — Ambulatory Visit: Payer: Federal, State, Local not specified - PPO | Admitting: Hematology

## 2021-02-13 ENCOUNTER — Other Ambulatory Visit: Payer: Self-pay

## 2021-02-13 ENCOUNTER — Other Ambulatory Visit: Payer: Self-pay | Admitting: Diagnostic Radiology

## 2021-02-13 ENCOUNTER — Ambulatory Visit
Admission: RE | Admit: 2021-02-13 | Discharge: 2021-02-13 | Disposition: A | Discharge status: Home / Self Care | Payer: Federal, State, Local not specified - PPO | Source: Ambulatory Visit | Attending: General Surgery | Admitting: General Surgery

## 2021-02-13 ENCOUNTER — Encounter: Payer: Self-pay | Admitting: Hematology

## 2021-02-13 DIAGNOSIS — D242 Benign neoplasm of left breast: Secondary | ICD-10-CM | POA: Diagnosis not present

## 2021-02-13 DIAGNOSIS — R9389 Abnormal findings on diagnostic imaging of other specified body structures: Secondary | ICD-10-CM

## 2021-02-13 DIAGNOSIS — C50412 Malignant neoplasm of upper-outer quadrant of left female breast: Secondary | ICD-10-CM | POA: Diagnosis not present

## 2021-02-13 HISTORY — PX: BREAST BIOPSY: SHX20

## 2021-02-13 IMAGING — MG MM BREAST LOCALIZATION CLIP
4 series · 4 of 12 positions shown · non-contrast
Comparison: Previous exam(s).

CLINICAL DATA: Status post MRI guided biopsies for 2 separate sites
in the upper-outer quadrant of the LEFT breast. These 2 sites are
located adjacent to 1 another in the upper-outer quadrant.

Ultrasound-guided biopsy on [DATE] revealed invasive ductal
carcinoma in the LEFT breast at the 2:30 o'clock axis (ribbon shaped
clip).
EXAM:
3D DIAGNOSTIC LEFT MAMMOGRAM POST MRI BIOPSY x2

[L CC synth-2D]
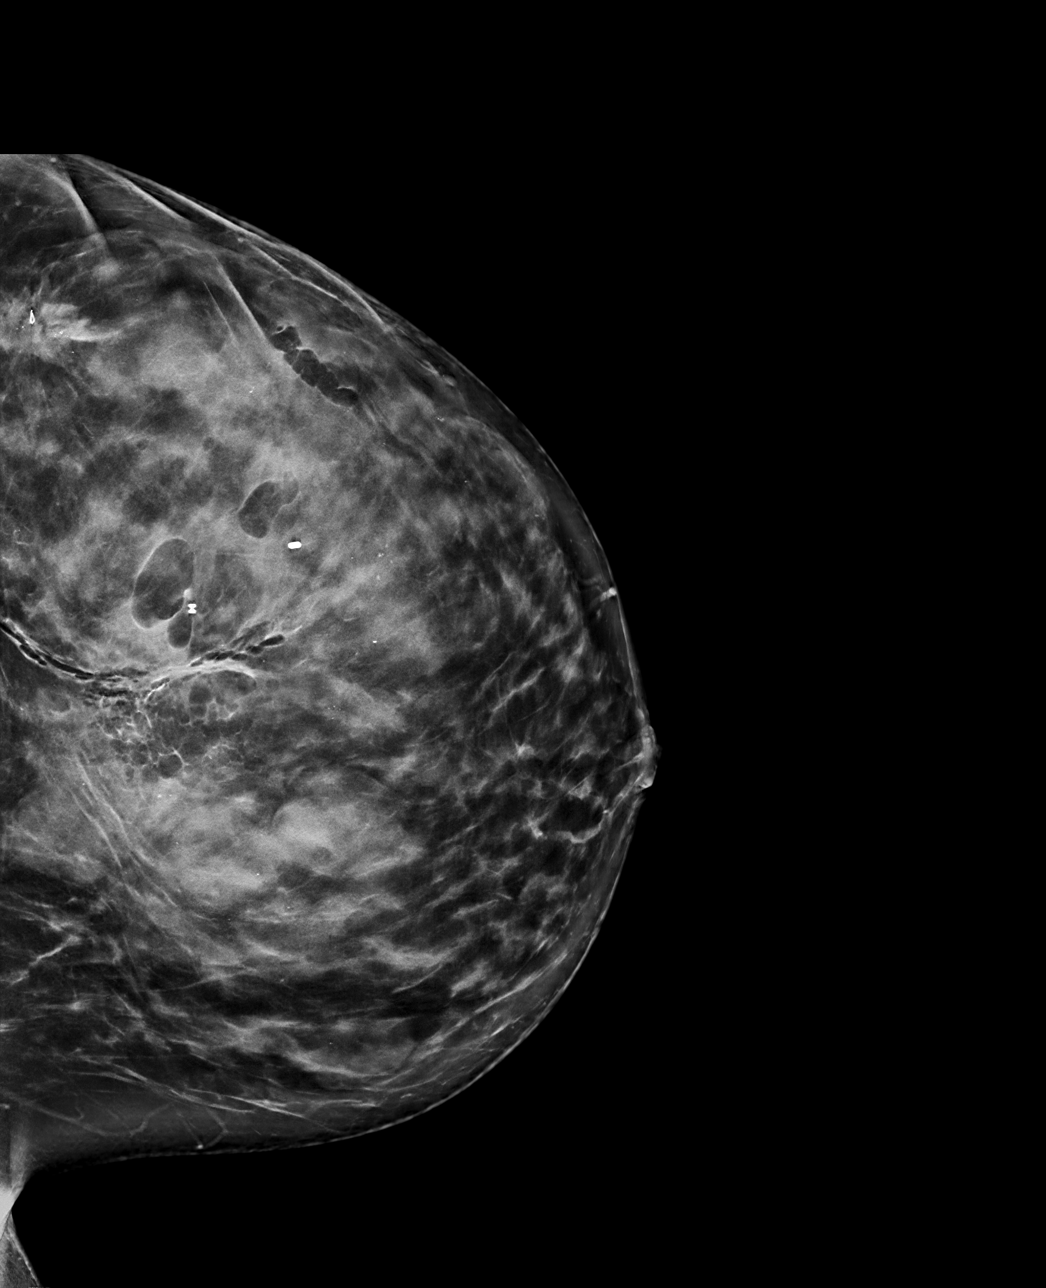

[L ML synth-2D]
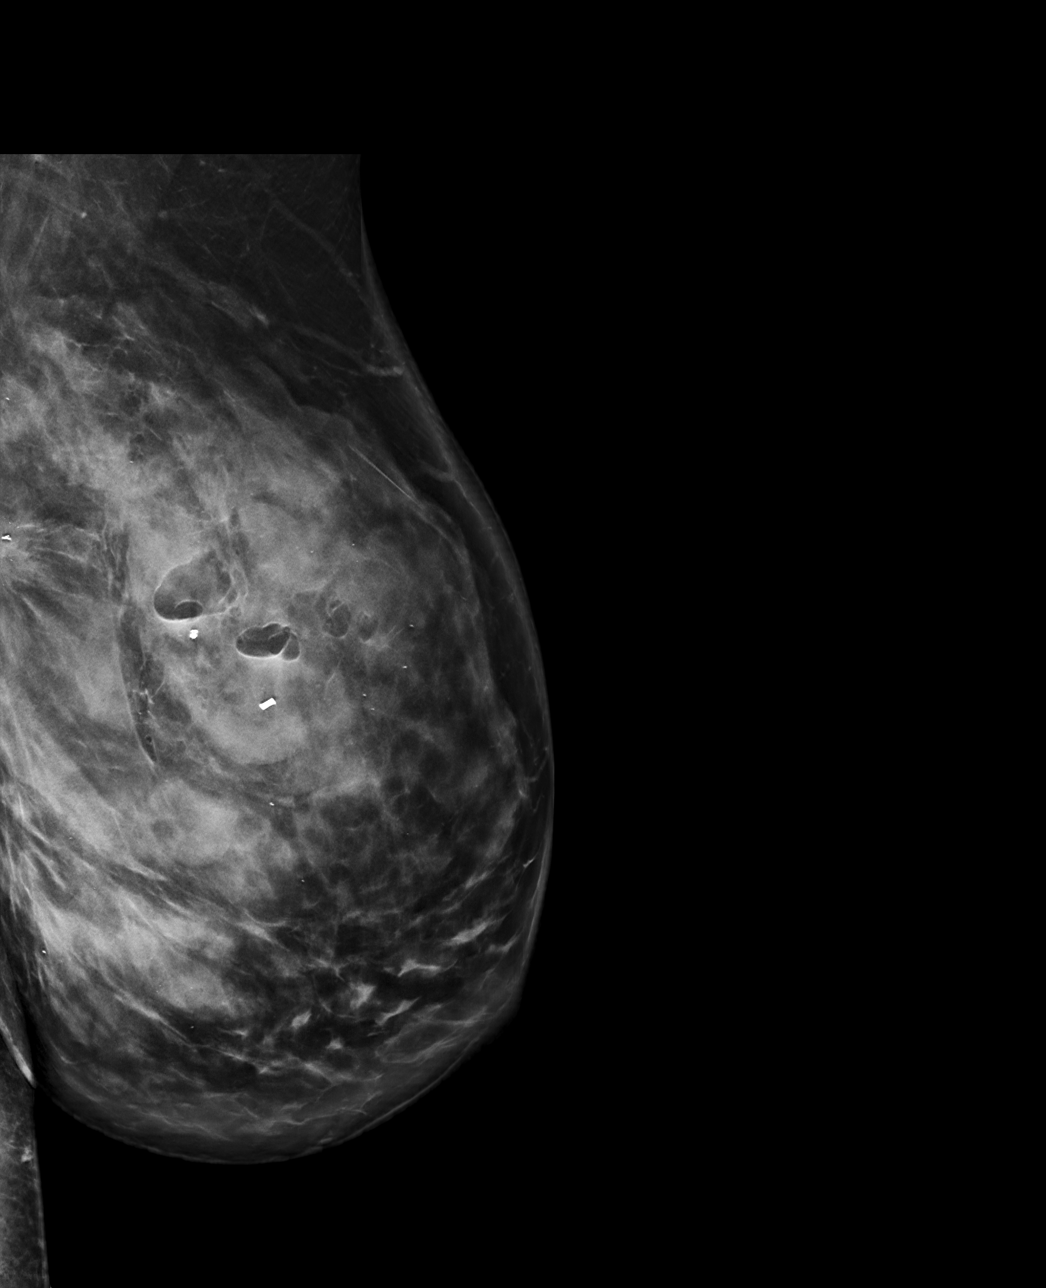

[L ML tomo · tomo slice 52/103.0]
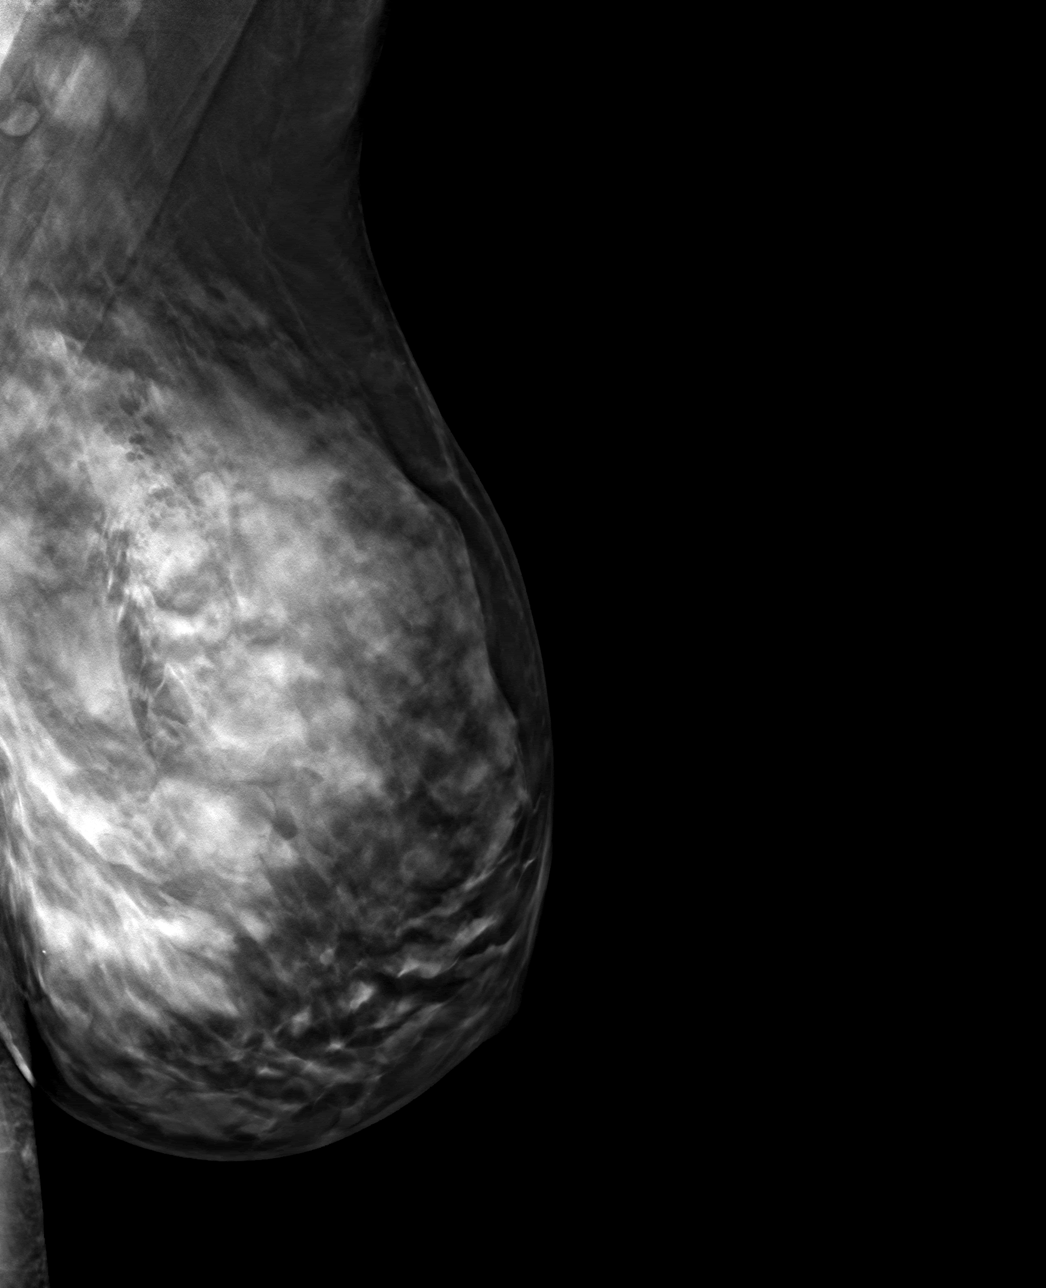

[L CC tomo · tomo slice 45/90.0]
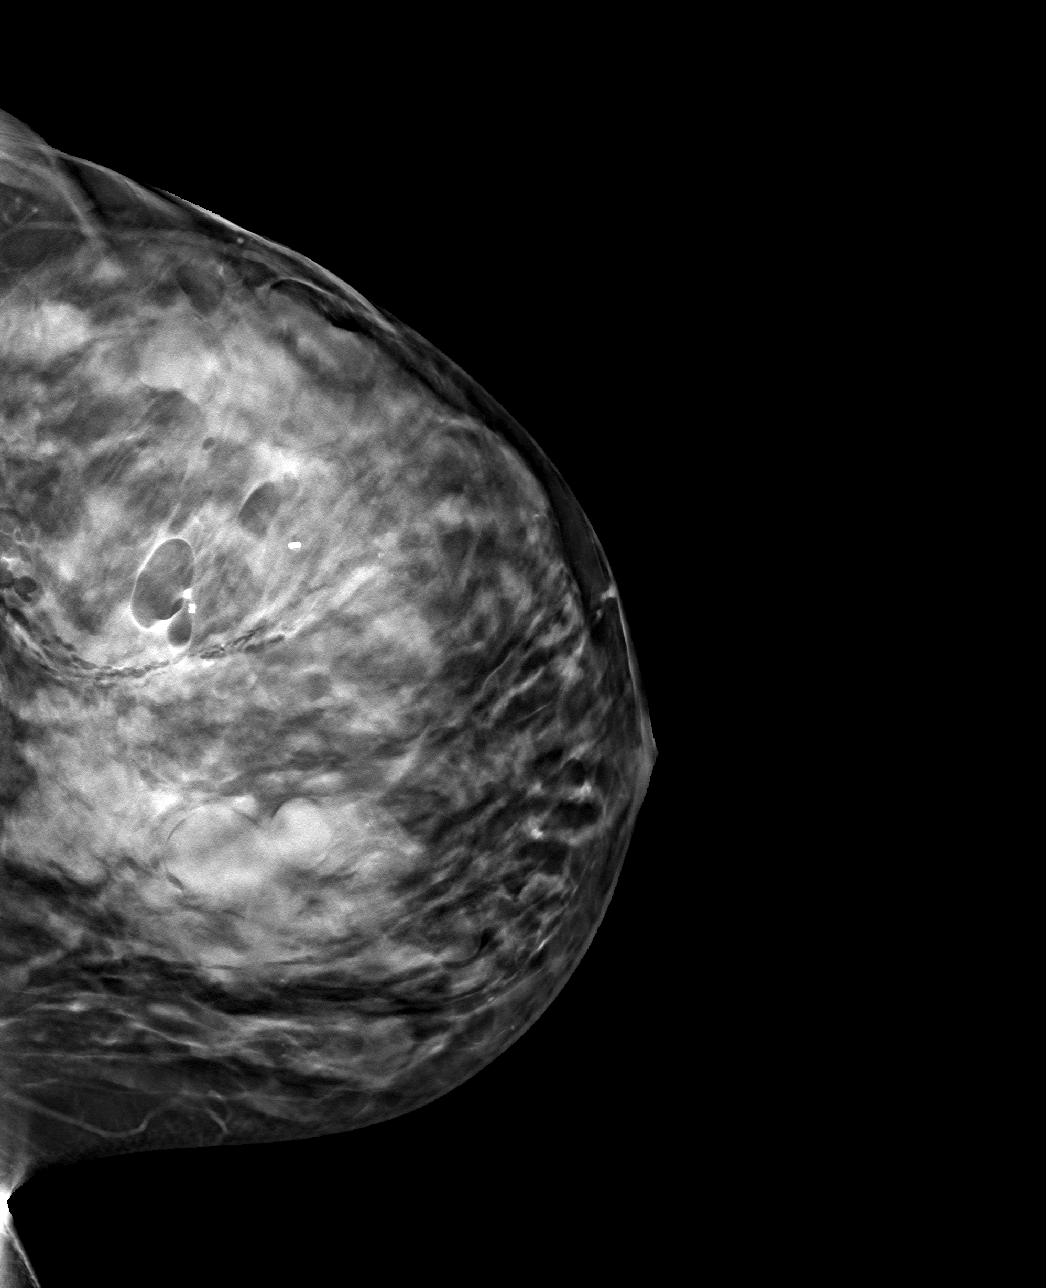

[4 of 12 positions shown; findings below may reference images not displayed]

FINDINGS: 3D Mammographic images were obtained following MRI guided biopsy of
2 separate adjacent enhancing masses within the upper-outer quadrant
of the LEFT breast. The biopsy marking clips are in expected
position at the sites of biopsy.
IMPRESSION: 1. Appropriate positioning of the cylinder shaped biopsy marking
clip at the site of biopsy in the upper-outer quadrant of the LEFT
breast (biopsy labeled site 1: LEFT breast, upper-outer quadrant,
anterior.
2. Appropriate positioning of the barbell shaped biopsy marking clip
at the site of biopsy in the upper-outer quadrant of the LEFT breast
(biopsy labeled site 2: LEFT breast, upper-outer quadrant,
posterior.
3. Ribbon shaped clip within the outer LEFT breast, at far posterior
depth, corresponding to a known biopsy proven invasive carcinoma
(ultrasound-guided biopsy performed on [DATE]).

Final Assessment: Post Procedure Mammograms for Marker Placement

## 2021-02-13 IMAGING — MR MR BREAST BX W LOC DEV 1ST LESION IMAGE BX SPEC MR GUIDE*L*
6 of 8 series · 29 of 48 positions shown · IV contrast (10 ml gadavist)
Comparison: Previous exams.
COMPARISON: Previous exams.

Addendum:
CLINICAL DATA: Recent ultrasound-guided biopsy revealed an invasive
mammary carcinoma in the LEFT breast at the 2:30 o'clock axis.
TECHNIQUE: Multiplanar, multisequence MR imaging of the LEFT breast was
performed both before and after administration of intravenous
contrast.

CONTRAST:  10 cc Gadavist

[Series 2: fiducial unilateral · sagittal · 2.0mm · 1.33mm/px · 1 of 52 slices shown]
[im 1/52]
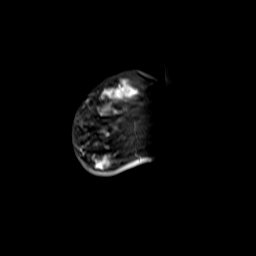

[Series 3: dynamic pre · axial · non-contrast · 1.3mm · 0.73mm/px · z∈[-74,+112]mm · 6 of 144 slices shown]
[im 1/144]
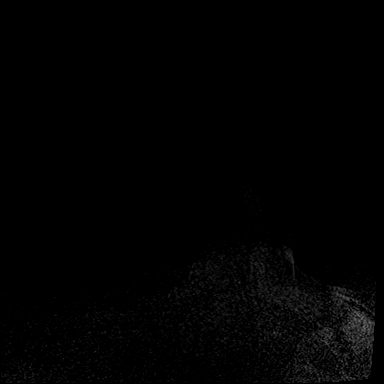
[im 29/144]
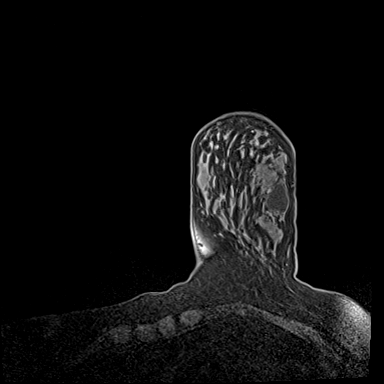
[im 58/144]
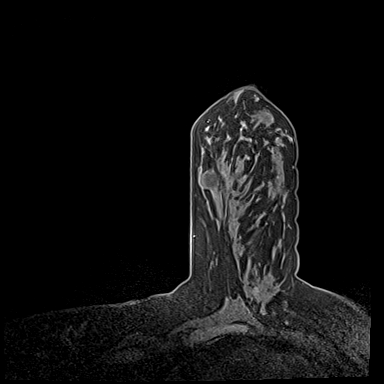
[im 86/144]
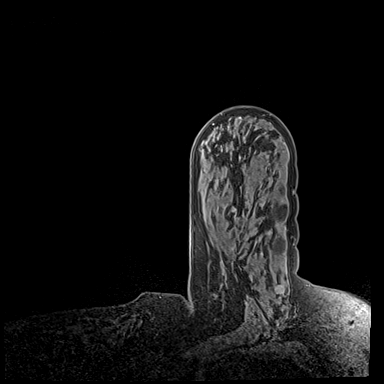
[im 115/144]
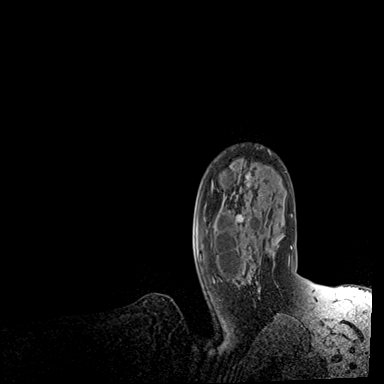
[im 144/144]
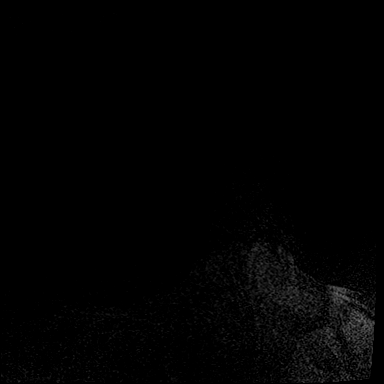

[Series 4: dynamic post 20 · axial · 1.3mm · 0.73mm/px · z∈[-74,+112]mm · 6 of 144 slices shown (1 of 2)]
[im 1/144]
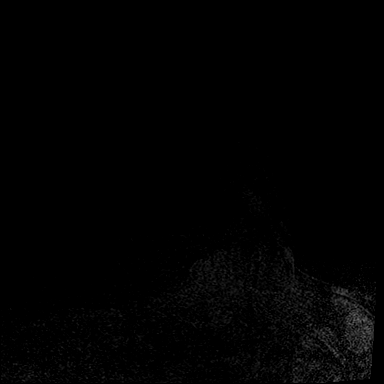
[im 29/144]
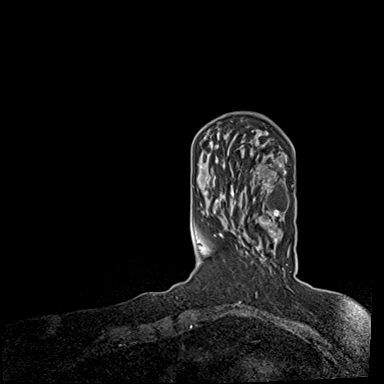
[im 58/144]
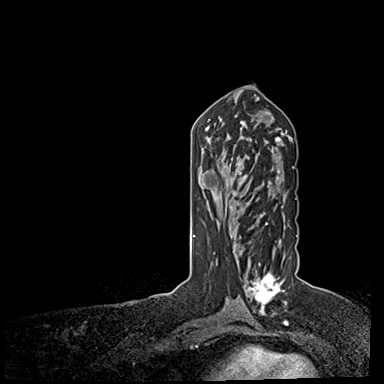
[im 86/144]
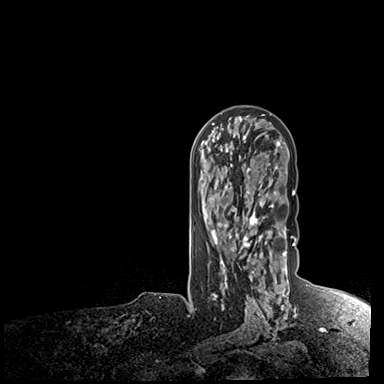
[im 115/144]
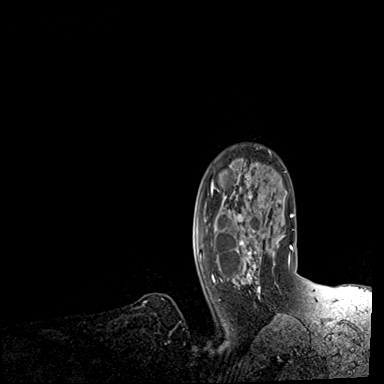
[im 144/144]
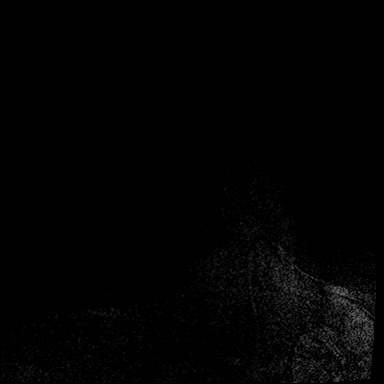

[Series 5: dynamic post 20 · axial · 1.3mm · 0.73mm/px · z∈[-74,+112]mm · 7 of 144 slices shown (2 of 2)]
[im 1/144]
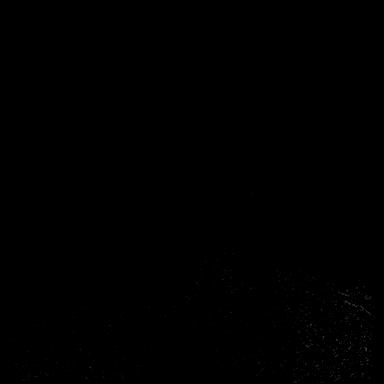
[im 24/144]
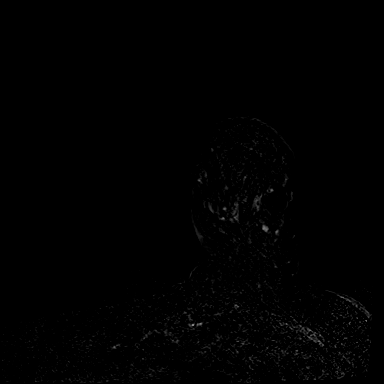
[im 48/144]
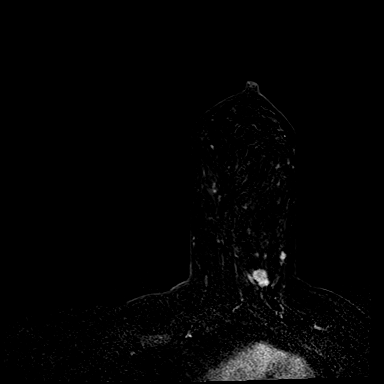
[im 72/144]
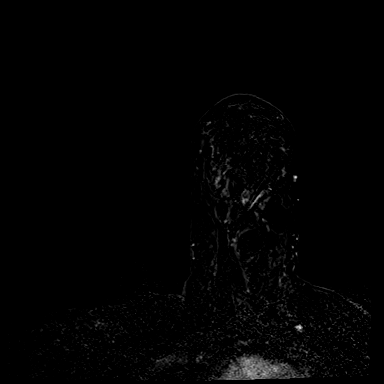
[im 96/144]
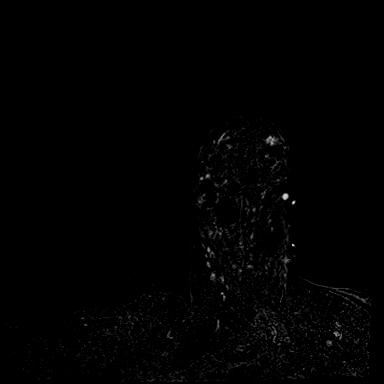
[im 120/144]
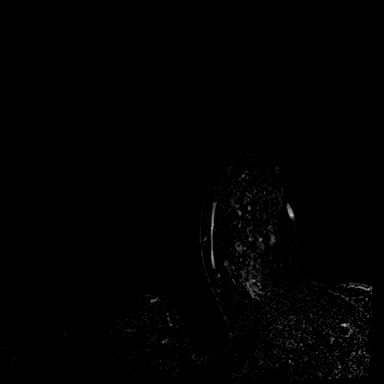
[im 144/144]
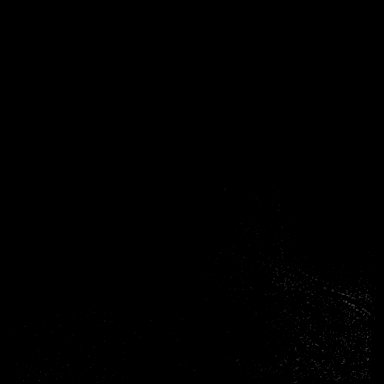

[Series 6: needle confirmation · axial · 1.3mm · 0.73mm/px · z∈[-74,+112]mm · 7 of 144 slices shown]
[im 1/144]
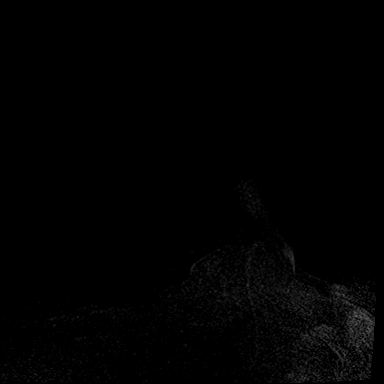
[im 24/144]
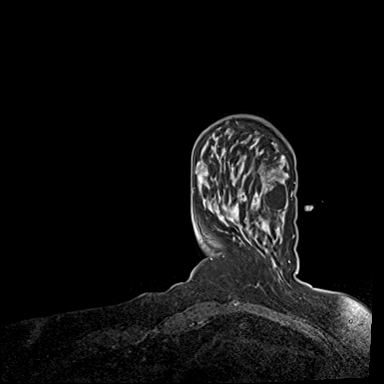
[im 48/144]
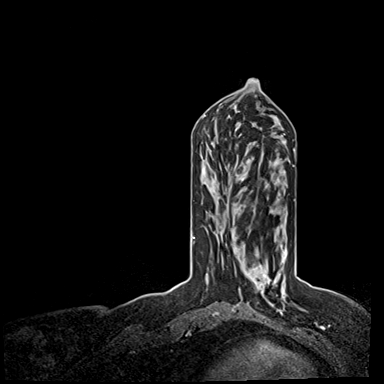
[im 72/144]
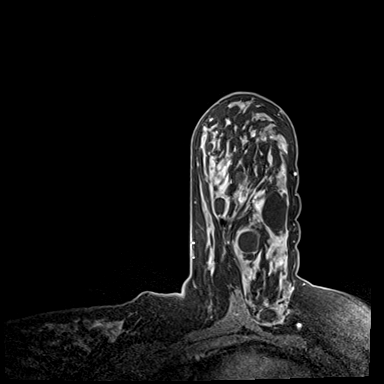
[im 96/144]
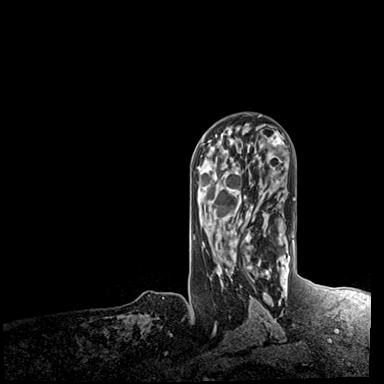
[im 120/144]
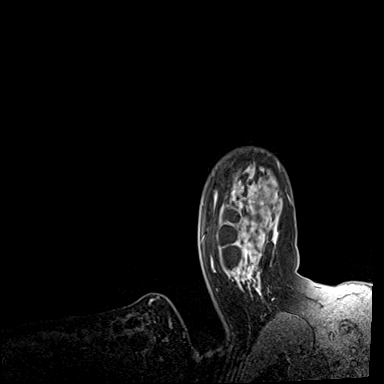
[im 144/144]
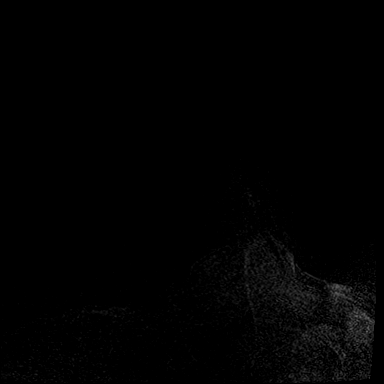

[Series 7: needle confirmation_sub · axial · 1.3mm · 0.73mm/px · z∈[-74,-44]mm · 2 of 144 slices shown]
[im 1/144]
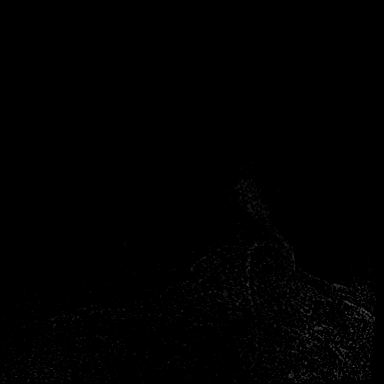
[im 24/144]
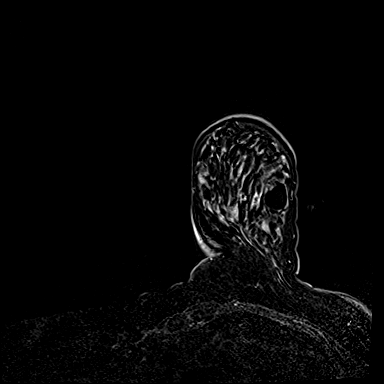

[29 of 48 positions shown; findings below may reference images not displayed]

Subsequent MRI demonstrated 2 suspicious masses adjacent to each
other within the upper central LEFT breast warranting tissue
diagnosis. Patient presents today for MRI guided biopsies of both
sites. These biopsies will be labeled as follows:

Site 1: Upper-outer quadrant, anterior

Site 2: Upper-outer quadrant, posterior

EXAM:
MRI GUIDED CORE NEEDLE BIOPSY OF THE LEFT BREAST x2
FINDINGS: I met with the patient, and we discussed the procedure of MRI guided
biopsy, including risks, benefits, and alternatives. Specifically,
we discussed the risks of infection, bleeding, tissue injury, clip
migration, and inadequate sampling. Informed, written consent was
given. The usual time out protocol was performed immediately prior
to the procedure.

Site 1: Using sterile technique, 1% Lidocaine, MRI guidance, and a 9
gauge vacuum assisted device, biopsy was performed of the enhancing
mass within the upper-outer quadrant of the LEFT breast (anterior
lesion) using a lateral approach. At the conclusion of the
procedure, a cylinder tissue marker clip was deployed into the
biopsy cavity.

Site 2: Using sterile technique, 1% Lidocaine, MRI guidance, and a 9
gauge vacuum assisted device, biopsy was performed of the additional
enhancing mass within the upper-outer quadrant of the LEFT breast
(posterior lesion) using a lateral approach. At the conclusion of
the procedure, a barbell tissue marker clip was deployed into the
biopsy cavity.

Follow-up 2-view mammogram was performed and dictated separately.
IMPRESSION: 1. MRI guided biopsy of the suspicious enhancing mass within the
upper-outer quadrant of the LEFT breast (anterior lesion). Cylinder
shaped clip placed at the biopsy site. No apparent complications.
2. MRI guided biopsy of the suspicious enhancing mass within the
upper-outer quadrant of the LEFT breast (posterior lesion). Barbell
shaped clip placed at the biopsy site. No apparent complications.

ADDENDUM:
Pathology revealed FOCAL INTRAVASCULAR TUMOR, DUCTAL PAPILLOMA,
COLUMNAR CELL CHANGE WITH CALCIFICATIONS, PSEUDOANGIOMATOUS STROMAL
HYPERPLASIA (PASH) of the LEFT breast, upper outer quadrant,
anterior lesion, (cylinder clip). There is a microscopic focus of
intralymphatic tumor. This was found to be concordant by Dr. LDRISIE
LDRISIE.

Pathology revealed GRADE II INVASIVE AND IN SITU DUCTAL CARCINOMA of
the LEFT breast, upper outer quadrant, posterior lesion, (barbell
clip). This was found to be concordant by Dr. LDRISIE.

Pathology results were discussed with the patient by telephone. The
patient reported doing well after the biopsies with tenderness at
the sites. Post biopsy instructions and care were reviewed and
questions were answered. The patient was encouraged to call The
direct phone number was provided.

The patient has a recent diagnosis of LEFT breast cancer and should
follow her outlined treatment plan.

Dr. LDRISIE and Dr. LDRISIE were notified of biopsy results
via [REDACTED] message on [DATE].

Pathology results reported by LDRISIE, RN on [DATE].

*** End of Addendum ***
Subsequent MRI demonstrated 2 suspicious masses adjacent to each
other within the upper central LEFT breast warranting tissue
diagnosis. Patient presents today for MRI guided biopsies of both
sites. These biopsies will be labeled as follows:

Site 1: Upper-outer quadrant, anterior

Site 2: Upper-outer quadrant, posterior

EXAM:
MRI GUIDED CORE NEEDLE BIOPSY OF THE LEFT BREAST x2
FINDINGS: I met with the patient, and we discussed the procedure of MRI guided
biopsy, including risks, benefits, and alternatives. Specifically,
we discussed the risks of infection, bleeding, tissue injury, clip
migration, and inadequate sampling. Informed, written consent was
given. The usual time out protocol was performed immediately prior
to the procedure.

Site 1: Using sterile technique, 1% Lidocaine, MRI guidance, and a 9
gauge vacuum assisted device, biopsy was performed of the enhancing
mass within the upper-outer quadrant of the LEFT breast (anterior
lesion) using a lateral approach. At the conclusion of the
procedure, a cylinder tissue marker clip was deployed into the
biopsy cavity.

Site 2: Using sterile technique, 1% Lidocaine, MRI guidance, and a 9
gauge vacuum assisted device, biopsy was performed of the additional
enhancing mass within the upper-outer quadrant of the LEFT breast
(posterior lesion) using a lateral approach. At the conclusion of
the procedure, a barbell tissue marker clip was deployed into the
biopsy cavity.

Follow-up 2-view mammogram was performed and dictated separately.
IMPRESSION: 1. MRI guided biopsy of the suspicious enhancing mass within the
upper-outer quadrant of the LEFT breast (anterior lesion). Cylinder
shaped clip placed at the biopsy site. No apparent complications.
2. MRI guided biopsy of the suspicious enhancing mass within the
upper-outer quadrant of the LEFT breast (posterior lesion). Barbell
shaped clip placed at the biopsy site. No apparent complications.

## 2021-02-13 MED ORDER — GADOBUTROL 1 MMOL/ML IV SOLN: 10.0000 mL | Freq: Once | INTRAVENOUS | Status: DC | PRN

## 2021-02-13 NOTE — Progress Notes (Signed)
New Breast Cancer Diagnosis: Left Breast UOQ  Did patient present with symptoms (if so, please note symptoms) or screening mammography?:Screening Mass    MRI Breast 02/04/2021: Known malignancy in the LATERAL portion of the LEFT breast measures 3.0 centimeters on MRI.  Single enlarged LEFT axillary lymph node represents the recently biopsied node with metastatic carcinoma.  Two additional suspicious masses in the UPPER central LEFT breast warranting tissue diagnosis. Given the very heterogeneous appearance of the LEFT breast, with numerous cysts, MR guided core biopsy is indicated for optimal targeting. 4. RIGHT breast is negative.  Location and Extent of disease :left breast. Located at 2:30 position, measured up to 2 cm in greatest dimension. Single appearing axillary lymph node that was suspicious.  Histology per Pathology Report: grade 2, Invasive Ductal Carcinoma with associated DCIS.  01/21/2021  Receptor Status: ER(positive), PR (positive), Her2-neu (negative), Ki-(15%)  Surgeon and surgical plan, if any:  Dr. Barry Dienes 02/17/2021 -Double mastectomy with reconstruction with Dr. Iran Planas -Seeing Dr. Iran Planas next week.  02/03/2021 -She will need MRI due to density and presence of multiple masses/cysts. -Will need genetics due to age. -I discussed the process by which breast conservation was performed. I can feel her cancer in the upper outer quadrant on the left, so I do not think that we need a seed in the breast.  -There is a cyst very close by as well which will likely come out with the cancer. She will need a seed in the positive lymph node. I reviewed the rationale for this. -I will also refer her for medical and radiation oncology.  -I discussed that she will likely need a MammaPrint postoperatively to determine whether or not she will have chemotherapy recommended to her.  -I also discussed that she may need ovarian suppression depending on whether or not she becomes  postmenopausal with adjuvant hormonal therapy.   Medical oncologist, treatment if any:   Dr. Burr Medico 02/17/2021 --she requested an appointment today to discuss surgical options. She is interested in bilateral mastectomies and BSO. I discussed the indications and benefits of these procedures, as well as logistics. Due to her young age and positive node, I do recommend BSO which has better oncological outcome based on clinical trial data (SOFT and TEXT trials), I reviewed with her.  -while she is waiting for surgery, I recommend Mammaprint on her biopsy sample to determine her risk of distant recurrence and role of chemotherapy if this returns high risk. She understands this. She is open to chemo if needed.   02/05/2021 --We discussed her imaging findings and the biopsy results in great details. -Given the early disease, she likely need a lumpectomy. She is agreeable with that. She was seen by Dr. Barry Dienes on 02/03/21 and likely will proceed with surgery soon.  -she had breast MRI yesterday and result is still pending  -I recommend a Mammaprint test on the surgical sample and we'll make a decision about adjuvant chemotherapy based on the Mammaprint result.  --Giving her strongly ER and PR positivity of the tumor cells, and her pre-menopause status, I recommend adjuvant endocrine therapy with tamoxifen. -We also discussed the use of ovarian suppression with Zoladex and AI as adjuvant therapy.  Due to her young age, strongly ER/PR positive disease with positive lymph nodes, and no plan for more children, I strongly recommend her to consider ovarian suppression and AI which showed less recurrences then adjuvant tamoxifen.     Family History of Breast/Ovarian/Prostate Cancer: No  Lymphedema issues, if any: No  Pain issues, if any: No     SAFETY ISSUES: Prior radiation? No Pacemaker/ICD? No Possible current pregnancy? Having cycles, using condoms, Ovarian removal with ob/GYN Is the patient on  methotrexate? No  Current Complaints / other details:

## 2021-02-14 ENCOUNTER — Encounter: Payer: Self-pay | Admitting: *Deleted

## 2021-02-17 ENCOUNTER — Encounter: Payer: Self-pay | Admitting: Hematology

## 2021-02-17 ENCOUNTER — Other Ambulatory Visit: Payer: Self-pay | Admitting: General Surgery

## 2021-02-17 ENCOUNTER — Inpatient Hospital Stay: Payer: Federal, State, Local not specified - PPO | Attending: Hematology | Admitting: Hematology

## 2021-02-17 ENCOUNTER — Other Ambulatory Visit: Payer: Self-pay

## 2021-02-17 VITALS — BP 134/70 | HR 86 | Temp 98.8°F | Resp 18 | Ht 66.0 in | Wt 199.6 lb

## 2021-02-17 DIAGNOSIS — N6489 Other specified disorders of breast: Secondary | ICD-10-CM | POA: Insufficient documentation

## 2021-02-17 DIAGNOSIS — N6310 Unspecified lump in the right breast, unspecified quadrant: Secondary | ICD-10-CM | POA: Diagnosis not present

## 2021-02-17 DIAGNOSIS — Z888 Allergy status to other drugs, medicaments and biological substances status: Secondary | ICD-10-CM | POA: Diagnosis not present

## 2021-02-17 DIAGNOSIS — Z170 Estrogen receptor positive status [ER+]: Secondary | ICD-10-CM | POA: Diagnosis not present

## 2021-02-17 DIAGNOSIS — Z808 Family history of malignant neoplasm of other organs or systems: Secondary | ICD-10-CM | POA: Insufficient documentation

## 2021-02-17 DIAGNOSIS — Z9013 Acquired absence of bilateral breasts and nipples: Secondary | ICD-10-CM | POA: Insufficient documentation

## 2021-02-17 DIAGNOSIS — C773 Secondary and unspecified malignant neoplasm of axilla and upper limb lymph nodes: Secondary | ICD-10-CM | POA: Insufficient documentation

## 2021-02-17 DIAGNOSIS — C50412 Malignant neoplasm of upper-outer quadrant of left female breast: Secondary | ICD-10-CM | POA: Insufficient documentation

## 2021-02-17 DIAGNOSIS — R922 Inconclusive mammogram: Secondary | ICD-10-CM | POA: Diagnosis not present

## 2021-02-17 DIAGNOSIS — Z79899 Other long term (current) drug therapy: Secondary | ICD-10-CM | POA: Insufficient documentation

## 2021-02-17 NOTE — Progress Notes (Signed)
[place]   [phone] [phone]   Clinic Follow up Note   Patient Care Team: Sueanne Margarita, DO as PCP - General (Internal Medicine) Stark Klein, MD as Consulting Physician (General Surgery) [name], MD as Consulting Physician (Hematology) Mauro Kaufmann, RN as Registered Nurse Rockwell Germany, RN as Registered Nurse  Date of Service:  02/17/2021  CHIEF COMPLAINT: f/u of left breast cancer  CURRENT THERAPY:  Pending definitive surgery  ASSESSMENT & PLAN:  Diana Weaver is a 45 y.o. female with   1. Malignant neoplasm of upper-outer quadrant of left breast, invasive ductal carcinoma, Stage IB, c(T1cN1M0), ER+/PR+/HER2-, Grade 2  -found on screening mammogram. Diagnostic MM and left [place] on 01/14/21 showed 2 cm left breast mass at 2:30, single suspicious lymph node. Biopsy on 01/21/21 confirmed invasive ductal carcinoma, grade 2, with DCIS. Biopsied lymph node showed metastatic carcinoma. -breast MRI 02/04/21 showed two additional suspicious areas in upper central left breast. Biopsy 02/13/21 showed invasive and in situ carcinoma to posterior lesion. Anterior lesion showed focal intravascular tumor and ductal papilloma. -she requested an appointment today to discuss surgical options. She is interested in bilateral mastectomies and BSO. I discussed the indications and benefits of these procedures, as well as logistics. Due to her young age and positive node, I do recommend BSO which has better oncological outcome based on clinical trial data (SOFT and TEXT trials), I reviewed with her.  -while she is waiting for surgery, I recommend Mammaprint on her biopsy sample to determine her risk of distant recurrence and role of chemotherapy if this returns high risk. She understands this. She is open to chemo if needed.    2. 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 -she underwent genetic counseling and testing on 02/05/21. Results are pending.     PLAN:  -she will also see Dr. Barry Dienes today to discuss b/l mastectomies and reconstruction  -consultation with Dr. Lisbeth Renshaw tomorrow, 02/18/21 -she will reach out to her GYN about BSO in near future, and call me back with date of surgery  -will request Mammaprint on her biopsy sample to determine neoadjuvant chemo, if low risk, I will put her on endocrine therapy while she is waiting for surgery.    No problem-specific Assessment & Plan notes found for this encounter.   SUMMARY OF ONCOLOGIC HISTORY: Oncology History Overview Note   Cancer Staging  Malignant neoplasm of upper-outer quadrant of left breast in female, estrogen receptor positive ([place]) Staging form: Breast, AJCC 8th Edition - Clinical stage from 01/21/2021: Stage IB (cT1c, cN1, cM0, G2, ER+, PR+, HER2-) - Signed by [name], MD on 02/04/2021    Malignant neoplasm of upper-outer quadrant of left breast in female, estrogen receptor positive ([place])  01/14/2021 Mammogram   EXAM: DIGITAL DIAGNOSTIC BILATERAL MAMMOGRAM WITH TOMOSYNTHESIS AND CAD; ULTRASOUND LEFT BREAST LIMITED  IMPRESSION: 1. Suspicious left breast mass at the 2:30 position 9 cm from the nipple corresponding with the screening mammographic findings. Recommendation is for ultrasound-guided biopsy. 2. Single, morphologically abnormal left axillary lymph node. Recommendation is for ultrasound-guided biopsy. 3. No persistent, suspicious findings in the right breast.   01/21/2021 Cancer Staging   Staging form: Breast, AJCC 8th Edition - Clinical stage from 01/21/2021: Stage IB (cT1c, cN1, cM0, G2, ER+, PR+, HER2-) - Signed by [name], MD on 02/04/2021 Stage prefix: Initial diagnosis Histologic grading system: 3  grade system    01/21/2021 Pathology Results   Diagnosis 1. Breast, left, needle core biopsy, 2:30 o'clock, ribbon, 9cmfn - INVASIVE  DUCTAL CARCINOMA - DUCTAL CARCINOMA IN SITU - SEE COMMENT 2. Lymph node, needle/core biopsy, left axillary node, tribell - METASTATIC CARCINOMA INVOLVING NODAL TISSUE Microscopic Comment 1. Based on the biopsy, the carcinoma appears Nottingham grade 2 of 3 and measures 1.1 cm in greatest linear extent.  1. PROGNOSTIC INDICATORS Results: The tumor cells are EQUIVOCAL for Her2 (2+). Her2 by FISH will be performed and results reported separately. Estrogen Receptor: 100%, POSITIVE, STRONG STAINING INTENSITY Progesterone Receptor: 100%, POSITIVE, STRONG STAINING INTENSITY Proliferation Marker Ki67: 15% 1. FLUORESCENCE IN-SITU HYBRIDIZATION Results: GROUP 5: HER2 **NEGATIVE**  2. PROGNOSTIC INDICATORS Results: The tumor cells are EQUIVOCAL for Her2 (2+). Her2 by FISH will be performed and results reported separately Estrogen Receptor: 100%, POSITIVE, STRONG STAINING INTENSITY Progesterone Receptor: 100%, POSITIVE, STRONG STAINING INTENSITY 2. FLUORESCENCE IN-SITU HYBRIDIZATION Results: GROUP 5: HER2 **NEGATIVE**   02/04/2021 Initial Diagnosis   Malignant neoplasm of upper-outer quadrant of left breast in female, estrogen receptor positive ([facility])   02/04/2021 Imaging   EXAM: BILATERAL BREAST MRI WITH AND WITHOUT CONTRAST  IMPRESSION: 1. Known malignancy in the LATERAL portion of the LEFT breast measures 3.0 centimeters on MRI. 2. Single enlarged LEFT axillary lymph node represents the recently biopsied node with metastatic carcinoma. 3. Two additional suspicious masses in the UPPER central LEFT breast warranting tissue diagnosis. Given the very heterogeneous appearance of the LEFT breast, with numerous cysts, MR guided core biopsy is indicated for optimal targeting. 4. RIGHT breast is negative.   02/13/2021 Pathology Results   Diagnosis 1. Breast, left, needle core biopsy, upper outer quadrant(anterior lesion) - FOCAL INTRAVASCULAR TUMOR. - DUCTAL PAPILLOMA. - COLUMNAR CELL  CHANGE WITH CALCIFICATIONS. - PSEUDOANGIOMATOUS STROMAL HYPERPLASIA (PASH). - SEE MICROSCOPIC DESCRIPTION. 2. Breast, left, needle core biopsy, upper outer quadrant(posterior lesion) - INVASIVE AND IN SITU DUCTAL CARCINOMA, GRADE 2. - SEE MICROSCOPIC DESCRIPTION.      INTERVAL HISTORY:  Diana Weaver is here for a follow up of breast cancer. She was last seen by me on 02/05/21. She presents to the clinic accompanied by her husband. She requested to speak with me regarding her surgical preferences/wishes and treatment.   All other systems were reviewed with the patient and are negative.  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  ALLERGIES:  is allergic to iodine, latex, and pistachio nut (diagnostic).  MEDICATIONS:  No current outpatient medications on file.   No current facility-administered medications for this visit.    PHYSICAL EXAMINATION: ECOG PERFORMANCE STATUS: 0 - Asymptomatic  Vitals:   02/17/21 1324  BP: 134/70  Pulse: 86  Resp: 18  Temp: 98.8 F (37.1 C)  SpO2: 98%   Wt Readings from Last 3 Encounters:  02/17/21 199 lb 9.6 oz (90.5 kg)  02/05/21 200 lb 4.8 oz (90.9 kg)  06/10/20 198 lb (89.8 kg)     GENERAL:alert, no distress and comfortable SKIN: skin color normal, no rashes or significant lesions   EYES: normal, Conjunctiva are pink and non-injected, sclera clear  NEURO: alert & oriented x 3 with fluent speech  LABORATORY DATA:  I have reviewed the data as listed CBC Latest Ref Rng & Units 02/05/2021 06/10/2020  WBC 4.0 - 10.5 K/uL  11.9(H) 16.1(H)  Hemoglobin 12.0 - 15.0 g/dL 13.3 12.5  Hematocrit 36.0 - 46.0 % 40.6 38.5  Platelets 150 - 400 K/uL 333 283     CMP Latest Ref Rng & Units 02/05/2021 06/10/2020  Glucose 70 - 99 mg/dL 89 115(H)  BUN 6 - 20 mg/dL 15 17  Creatinine 0.44 - 1.00 mg/dL 0.76 1.39(H)  Sodium 135 - 145 mmol/L 136 138  Potassium 3.5 - 5.1 mmol/L 3.8 4.1  Chloride 98 - 111 mmol/L 105 108  CO2 22 - 32 mmol/L 23 23  Calcium 8.9 - 10.3 mg/dL 9.0 8.7(L)  Total Protein 6.5 - 8.1 g/dL 7.4 7.1  Total Bilirubin 0.3 - 1.2 mg/dL 0.2(L) 0.5  Alkaline Phos 38 - 126 U/L 69 63  AST 15 - 41 U/L 19 13(L)  ALT 0 - 44 U/L 22 13      RADIOGRAPHIC STUDIES: I have personally reviewed the radiological images as listed and agreed with the findings in the report. No results found.    No orders of the defined types were placed in this encounter.  All questions were answered. The patient knows to call the clinic with any problems, questions or concerns. No barriers to learning was detected. The total time spent in the appointment was 30 minutes.     Yan Feng, MD 02/17/2021   I, Katie Daubenspeck, am acting as scribe for Yan Feng, MD.   I have reviewed the above documentation for accuracy and completeness, and I agree with the above.     

## 2021-02-18 ENCOUNTER — Encounter: Payer: Self-pay | Admitting: *Deleted

## 2021-02-18 ENCOUNTER — Other Ambulatory Visit: Payer: Self-pay

## 2021-02-18 ENCOUNTER — Ambulatory Visit
Admission: RE | Admit: 2021-02-18 | Discharge: 2021-02-18 | Disposition: A | Discharge status: Home / Self Care | Payer: Federal, State, Local not specified - PPO | Source: Ambulatory Visit | Attending: Radiation Oncology | Admitting: Radiation Oncology

## 2021-02-18 ENCOUNTER — Telehealth: Payer: Self-pay | Admitting: *Deleted

## 2021-02-18 ENCOUNTER — Telehealth: Payer: Self-pay | Admitting: Genetic Counselor

## 2021-02-18 ENCOUNTER — Encounter: Payer: Self-pay | Admitting: Genetic Counselor

## 2021-02-18 ENCOUNTER — Encounter: Payer: Self-pay | Admitting: Radiation Oncology

## 2021-02-18 ENCOUNTER — Ambulatory Visit: Payer: Self-pay | Admitting: Genetic Counselor

## 2021-02-18 VITALS — BP 122/71 | HR 87 | Temp 97.7°F | Resp 20 | Ht 64.0 in | Wt 201.0 lb

## 2021-02-18 DIAGNOSIS — Z7984 Long term (current) use of oral hypoglycemic drugs: Secondary | ICD-10-CM | POA: Insufficient documentation

## 2021-02-18 DIAGNOSIS — C50412 Malignant neoplasm of upper-outer quadrant of left female breast: Secondary | ICD-10-CM

## 2021-02-18 DIAGNOSIS — Z808 Family history of malignant neoplasm of other organs or systems: Secondary | ICD-10-CM | POA: Diagnosis not present

## 2021-02-18 DIAGNOSIS — Z170 Estrogen receptor positive status [ER+]: Secondary | ICD-10-CM

## 2021-02-18 DIAGNOSIS — Z803 Family history of malignant neoplasm of breast: Secondary | ICD-10-CM | POA: Diagnosis not present

## 2021-02-18 DIAGNOSIS — Z1379 Encounter for other screening for genetic and chromosomal anomalies: Secondary | ICD-10-CM | POA: Insufficient documentation

## 2021-02-18 DIAGNOSIS — Z87891 Personal history of nicotine dependence: Secondary | ICD-10-CM | POA: Insufficient documentation

## 2021-02-18 DIAGNOSIS — C773 Secondary and unspecified malignant neoplasm of axilla and upper limb lymph nodes: Secondary | ICD-10-CM | POA: Diagnosis not present

## 2021-02-18 NOTE — Progress Notes (Signed)
Radiation Oncology         [phone] ________________________________  Name: Diana Weaver        MRN: [id number]  Date of Service: 02/18/2021 DOB: October 12, 1975  DP:OEUMPN, Liane Comber, DO  [name], MD     REFERRING PHYSICIAN: [name], MD   DIAGNOSIS: The encounter diagnosis was Malignant neoplasm of upper-outer quadrant of left breast in female, estrogen receptor positive ([place]).   HISTORY OF PRESENT ILLNESS: Diana Weaver is a 45 y.o. female seen at the request of Dr. Burr Medico for new diagnosis of left breast cancer.  The patient was found on screening mammogram to have a lesion in the left breast at 230 o'clock measuring up to 2 cm and a single appearing axillary lymph node that was suspicious.  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  Her node was also sampled and positive for disease and was also ER positive PR negative HER2 negative.  She did undergo an MRI of both breasts on 02/04/2021, her known disease in the left breast was identified and measured 3 cm, a single left axillary node that had been biopsied was also identified and 2 additional masses in the upper central left breast were identified.  The right breast appeared normal.  She also underwent additional biopsies of the left breast on 02/13/2021. One biopsy was benign, but the other was again consistent with grade 2 invasive ductal carcinoma. She met with Dr. Barry Dienes and she has plans to undergo bilateral mastectomies with reconstruction. Dr. Burr Medico plans to order MammaPrint to determine the role of systemic therapy.  She is seen to discuss adjuvant radiotherapy to the left chest wall and regional lymph nodes.    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 early 59s   Hodgkin's lymphoma Paternal Grandfather        d. <60?   Throat cancer Cousin        maternal female cousin; dx 101s     SOCIAL HISTORY:  reports that she quit smoking about 11 years ago. Her smoking use included cigarettes. She has a 2.50 pack-year smoking history. She has never used smokeless tobacco. She reports current alcohol use. She reports that she does not use drugs.   ALLERGIES: Iodine, Latex, and Pistachio nut (diagnostic)   MEDICATIONS:  Current  Outpatient Medications  Medication Sig Dispense Refill   metFORMIN (GLUCOPHAGE) 500 MG tablet Take by mouth 2 (two) times daily with a meal.     No current facility-administered medications for this encounter.     REVIEW OF SYSTEMS: On review of systems, the patient reports that she is doing well overall. She denies any chest pain, shortness of breath, cough, fevers, chills, night sweats, unintended weight changes. She denies any bowel or bladder disturbances, and denies abdominal pain, nausea or vomiting. She denies any new musculoskeletal or joint aches or pains. A complete review of systems is obtained and is otherwise negative.     PHYSICAL EXAM:  Wt Readings from Last 3 Encounters:  02/18/21 201 lb (91.2  kg)  02/17/21 199 lb 9.6 oz (90.5 kg)  02/05/21 200 lb 4.8 oz (90.9 kg)   Temp Readings from Last 3 Encounters:  02/18/21 97.7 F (36.5 C)  02/17/21 98.8 F (37.1 C) (Oral)  02/05/21 98.1 F (36.7 C) (Oral)   BP Readings from Last 3 Encounters:  02/18/21 122/71  02/17/21 134/70  02/05/21 (!) 142/77   Pulse Readings from Last 3 Encounters:  02/18/21 87  02/17/21 86  02/05/21 78    In general this is a well appearing caucasian female in no acute distress. She's alert and oriented x4 and appropriate throughout the examination. Cardiopulmonary assessment is negative for acute distress and she exhibits normal effort. Bilateral breast exam is deferred.    ECOG = 0  0 - Asymptomatic (Fully active, able to carry on all predisease activities without restriction)  1 - Symptomatic but completely ambulatory (Restricted in physically strenuous activity but ambulatory and able to carry out work of a light or sedentary nature. For example, light housework, office work)  2 - Symptomatic, <50% in bed during the day (Ambulatory and capable of all self care but unable to carry out any work activities. Up and about more than 50% of waking hours)  3 - Symptomatic, >50% in bed, but not bedbound (Capable of only limited self-care, confined to bed or chair 50% or more of waking hours)  4 - Bedbound (Completely disabled. Cannot carry on any self-care. Totally confined to bed or chair)  5 - Death   Eustace Pen MM, Creech RH, Tormey DC, et al. [phone]). "Toxicity and response criteria of the [facility]". [place] Oncol. 5 (6): 649-55    LABORATORY DATA:  Lab Results  Component Value Date   WBC 11.9 (H) 02/05/2021   HGB 13.3 02/05/2021   HCT 40.6 02/05/2021   MCV 86.0 02/05/2021   PLT 333 02/05/2021   Lab Results  Component Value Date   NA 136 02/05/2021   K 3.8 02/05/2021   CL 105 02/05/2021   CO2 23 02/05/2021   Lab Results  Component Value Date   ALT 22  02/05/2021   AST 19 02/05/2021   ALKPHOS 69 02/05/2021   BILITOT 0.2 (L) 02/05/2021      RADIOGRAPHY: MR BREAST BILATERAL W WO CONTRAST INC CAD  Result Date: 02/05/2021 CLINICAL DATA:  Recent ultrasound-guided core biopsy of mass in the 2:30 o'clock location of the LEFT breast shows grade 2 invasive ductal carcinoma with ductal carcinoma in situ. Ultrasound-guided core biopsy of LEFT axillary lymph node shows metastatic carcinoma. EXAM: BILATERAL BREAST MRI WITH AND WITHOUT CONTRAST TECHNIQUE: Multiplanar, multisequence MR images of both breasts were obtained prior to and following the intravenous administration of 9 ml of Gadavist Three-dimensional MR images were rendered by  post-processing of the original MR data on an independent workstation. The three-dimensional MR images were interpreted, and findings are reported in the following complete MRI report for this study. Three dimensional images were evaluated at the independent interpreting workstation using the DynaCAD thin client. COMPARISON:  Previous exam(s). FINDINGS: Breast composition: d. Extreme fibroglandular tissue. Background parenchymal enhancement: Marked Right breast: No mass or abnormal enhancement. Numerous cysts are identified throughout the RIGHT breast. Left breast: No mass or abnormal enhancement. Within the posterolateral portion of the LEFT breast there is an enhancing irregular mass with irregular margins. Centrally, tissue marker clip is identified from recent ultrasound-guided core biopsy which demonstrated grade 2 invasive ductal carcinoma and ductal carcinoma in situ. Mass demonstrates washout type enhancement kinetics and measures 2.3 x 2.3 x 3.0 centimeters. Within the UPPER central portion of the LEFT breast, there are 2 irregular masses with irregular margins and similar washout type enhancement kinetics, separated by 5 millimeters of breast tissue. These masses measure 0.7 x 1.0 x 0.7 centimeters (anterior lesion), and 0.7  x 0.9 x 0.8 centimeters (posterior lesion). Numerous cysts are identified throughout the LEFT breast. Lymph nodes: Within the LEFT/axilla, an enlarged lymph node measures 1.4 x 1.7 centimeters on image 15 of series 2. Within this lymph node, tissue marker clip artifact is present from recent ultrasound-guided core biopsy which showed metastatic carcinoma. Other LEFT axillary lymph nodes have normal morphology. RIGHT axilla is negative for adenopathy. Internal mammary regions are negative for adenopathy. Ancillary findings:  None. IMPRESSION: 1. Known malignancy in the LATERAL portion of the LEFT breast measures 3.0 centimeters on MRI. 2. Single enlarged LEFT axillary lymph node represents the recently biopsied node with metastatic carcinoma. 3. Two additional suspicious masses in the UPPER central LEFT breast warranting tissue diagnosis. Given the very heterogeneous appearance of the LEFT breast, with numerous cysts, MR guided core biopsy is indicated for optimal targeting. 4. RIGHT breast is negative. RECOMMENDATION: Recommend MR guided core biopsy of 2 adjacent masses in the UPPER central LEFT breast. Treatment plan for known LEFT breast malignancy. BI-RADS CATEGORY  4: Suspicious. Electronically Signed   By: Nolon Nations M.D.   On: 02/05/2021 11:05  [place] AXILLARY NODE CORE BIOPSY LEFT  Addendum Date: 01/23/2021   ADDENDUM REPORT: 01/23/2021 08:15 ADDENDUM: Pathology revealed GRADE II INVASIVE DUCTAL CARCINOMA, DUCTAL CARCINOMA IN SITU of the LEFT breast, 2:30 o'clock, 9cmfn, (ribbon clip). This was found to be concordant by Dr. Lovey Newcomer. Pathology revealed METASTATIC CARCINOMA INVOLVING NODAL TISSUE of the LEFT axilla, (tribell clip). This was found to be concordant by Dr. Lovey Newcomer. Pathology results were discussed with the patient by telephone. The patient reported doing well after the biopsies with tenderness and bruising at the sites. Post biopsy instructions and care were reviewed and questions  were answered. The patient was encouraged to call [facility] for any additional concerns. My direct phone number was provided. Surgical consultation has been arranged with Dr. Stark Klein at [facility] on February 03, 2021. Pathology results reported by Terie Purser, RN on 01/22/2021. Electronically Signed   By: Lovey Newcomer M.D.   On: 01/23/2021 08:15   Result Date: 01/23/2021 CLINICAL DATA:  Patient with suspicious left breast mass and thickened left axillary lymph node. EXAM: ULTRASOUND GUIDED LEFT BREAST CORE NEEDLE BIOPSY COMPARISON:  Previous exam(s). PROCEDURE: I met with the patient and we discussed the procedure of ultrasound-guided biopsy, including benefits and alternatives. We discussed the high likelihood of a successful procedure.  We discussed the risks of the procedure, including infection, bleeding, tissue injury, clip migration, and inadequate sampling. Informed written consent was given. The usual time-out protocol was performed immediately prior to the procedure. Site 1: Left breast mass 2:30 o'clock Lesion quadrant: Upper outer quadrant Using sterile technique and 1% Lidocaine as local anesthetic, under direct ultrasound visualization, a 14 gauge spring-loaded device was used to perform biopsy of left breast mass 2:30 o'clock using a lateral approach. At the conclusion of the procedure ribbon shaped tissue marker clip was deployed into the biopsy cavity. Follow up 2 view mammogram was performed and dictated separately. Site 2: Left axillary lymph node. Using sterile technique and 1% Lidocaine as local anesthetic, under direct ultrasound visualization, a 14 gauge spring-loaded device was used to perform biopsy of left axillary lymph node using a lateral approach. At the conclusion of the procedure tribell tissue marker clip was deployed into the biopsy cavity. Follow up 2 view mammogram was performed and dictated separately. IMPRESSION: Ultrasound  guided biopsy of left breast mass and left axillary lymph node. No apparent complications. Electronically Signed: By: Lovey Newcomer M.D. On: 01/21/2021 09:37  MM CLIP PLACEMENT LEFT  Result Date: 02/13/2021 CLINICAL DATA:  Status post MRI guided biopsies for 2 separate sites in the upper-outer quadrant of the LEFT breast. These 2 sites are located adjacent to 1 another in the upper-outer quadrant. Ultrasound-guided biopsy on 01/23/2021 revealed invasive ductal carcinoma in the LEFT breast at the 2:30 o'clock axis (ribbon shaped clip). EXAM: 3D DIAGNOSTIC LEFT MAMMOGRAM POST MRI BIOPSY x2 COMPARISON:  Previous exam(s). FINDINGS: 3D Mammographic images were obtained following MRI guided biopsy of 2 separate adjacent enhancing masses within the upper-outer quadrant of the LEFT breast. The biopsy marking clips are in expected position at the sites of biopsy. IMPRESSION: 1. Appropriate positioning of the cylinder shaped biopsy marking clip at the site of biopsy in the upper-outer quadrant of the LEFT breast (biopsy labeled site 1: LEFT breast, upper-outer quadrant, anterior. 2. Appropriate positioning of the barbell shaped biopsy marking clip at the site of biopsy in the upper-outer quadrant of the LEFT breast (biopsy labeled site 2: LEFT breast, upper-outer quadrant, posterior. 3. Ribbon shaped clip within the outer LEFT breast, at far posterior depth, corresponding to a known biopsy proven invasive carcinoma (ultrasound-guided biopsy performed on 01/23/2021). Final Assessment: Post Procedure Mammograms for Marker Placement Electronically Signed   By: Franki Cabot M.D.   On: 02/13/2021 09:07  MM CLIP PLACEMENT LEFT  Result Date: 01/21/2021 CLINICAL DATA:  Patient status post ultrasound-guided biopsy left breast mass and left axillary lymph node EXAM: 3D DIAGNOSTIC LEFT MAMMOGRAM POST ULTRASOUND BIOPSY COMPARISON:  Previous exam(s). FINDINGS: 3D Mammographic images were obtained following ultrasound guided biopsy  of left breast mass and left axillary lymph node. Site 1: Left breast mass 2:30 o'clock: Ribbon shaped clip: In appropriate position. Site 2: Left axillary lymph node: Tribell clip: In appropriate position. IMPRESSION: Appropriate positioning of the biopsy marking clips as above. Final Assessment: Post Procedure Mammograms for Marker Placement Electronically Signed   By: Lovey Newcomer M.D.   On: 01/21/2021 09:38  [place] LT BREAST BX W LOC DEV 1ST LESION IMG BX SPEC US GUIDE  Addendum Date: 01/23/2021   ADDENDUM REPORT: 01/23/2021 08:15 ADDENDUM: Pathology revealed GRADE II INVASIVE DUCTAL CARCINOMA, DUCTAL CARCINOMA IN SITU of the LEFT breast, 2:30 o'clock, 9cmfn, (ribbon clip). This was found to be concordant by Dr. Lovey Newcomer. Pathology revealed METASTATIC CARCINOMA INVOLVING NODAL TISSUE of the LEFT  axilla, (tribell clip). This was found to be concordant by Dr. Lovey Newcomer. Pathology results were discussed with the patient by telephone. The patient reported doing well after the biopsies with tenderness and bruising at the sites. Post biopsy instructions and care were reviewed and questions were answered. The patient was encouraged to call [facility] for any additional concerns. My direct phone number was provided. Surgical consultation has been arranged with Dr. Stark Klein at [facility] on February 03, 2021. Pathology results reported by Terie Purser, RN on 01/22/2021. Electronically Signed   By: Lovey Newcomer M.D.   On: 01/23/2021 08:15   Result Date: 01/23/2021 CLINICAL DATA:  Patient with suspicious left breast mass and thickened left axillary lymph node. EXAM: ULTRASOUND GUIDED LEFT BREAST CORE NEEDLE BIOPSY COMPARISON:  Previous exam(s). PROCEDURE: I met with the patient and we discussed the procedure of ultrasound-guided biopsy, including benefits and alternatives. We discussed the high likelihood of a successful procedure. We discussed the risks of the procedure,  including infection, bleeding, tissue injury, clip migration, and inadequate sampling. Informed written consent was given. The usual time-out protocol was performed immediately prior to the procedure. Site 1: Left breast mass 2:30 o'clock Lesion quadrant: Upper outer quadrant Using sterile technique and 1% Lidocaine as local anesthetic, under direct ultrasound visualization, a 14 gauge spring-loaded device was used to perform biopsy of left breast mass 2:30 o'clock using a lateral approach. At the conclusion of the procedure ribbon shaped tissue marker clip was deployed into the biopsy cavity. Follow up 2 view mammogram was performed and dictated separately. Site 2: Left axillary lymph node. Using sterile technique and 1% Lidocaine as local anesthetic, under direct ultrasound visualization, a 14 gauge spring-loaded device was used to perform biopsy of left axillary lymph node using a lateral approach. At the conclusion of the procedure tribell tissue marker clip was deployed into the biopsy cavity. Follow up 2 view mammogram was performed and dictated separately. IMPRESSION: Ultrasound guided biopsy of left breast mass and left axillary lymph node. No apparent complications. Electronically Signed: By: Lovey Newcomer M.D. 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 There is a microscopic focus of intralymphatic tumor. This was found to be concordant by Dr. Franki Cabot. Pathology revealed GRADE II INVASIVE AND IN SITU DUCTAL CARCINOMA of the LEFT breast, upper outer quadrant, posterior lesion, (barbell clip). This was found to be concordant by Dr. Franki Cabot. Pathology results were  discussed with the patient by telephone. The patient reported doing well after the biopsies with tenderness at the sites. Post biopsy instructions and care were reviewed and questions were answered. The patient was encouraged to call [facility] for any additional concerns. My direct phone number was provided. The patient has a recent diagnosis of LEFT breast cancer and should follow her outlined treatment plan. Dr. Stark Klein and Dr. [name] were notified of biopsy results via EPIC message on February 14, 2021. Pathology results reported by Terie Purser, RN on 02/14/2021. Electronically Signed   By: Franki Cabot M.D.   On: 02/14/2021 14:50   Result Date: 02/14/2021 CLINICAL DATA:  Recent ultrasound-guided biopsy revealed an invasive mammary carcinoma in the  LEFT breast at the 2:30 o'clock axis. Subsequent MRI demonstrated 2 suspicious masses adjacent to each other within the upper central LEFT breast warranting tissue diagnosis. Patient presents today for MRI guided biopsies of both sites. These biopsies will be labeled as follows: Site 1: Upper-outer quadrant, anterior Site 2: Upper-outer quadrant, posterior EXAM: MRI GUIDED CORE NEEDLE BIOPSY OF THE LEFT BREAST x2 TECHNIQUE: Multiplanar, multisequence MR imaging of the LEFT breast was performed both before and after administration of intravenous contrast. CONTRAST:  10 cc Gadavist COMPARISON:  Previous exams. FINDINGS: I met with the patient, and we discussed the procedure of MRI guided biopsy, including risks, benefits, and alternatives. Specifically, we discussed the risks of infection, bleeding, tissue injury, clip migration, and inadequate sampling. Informed, written consent was given. The usual time out protocol was performed immediately prior to the procedure. Site 1: Using sterile technique, 1% Lidocaine, MRI guidance, and a 9 gauge vacuum assisted device, biopsy was performed of the enhancing mass within the upper-outer  quadrant of the LEFT breast (anterior lesion) using a lateral approach. At the conclusion of the procedure, a cylinder tissue marker clip was deployed into the biopsy cavity. Site 2: Using sterile technique, 1% Lidocaine, MRI guidance, and a 9 gauge vacuum assisted device, biopsy was performed of the additional enhancing mass within the upper-outer quadrant of the LEFT breast (posterior lesion) using a lateral approach. At the conclusion of the procedure, a barbell tissue marker clip was deployed into the biopsy cavity. Follow-up 2-view mammogram was performed and dictated separately. IMPRESSION: 1. MRI guided biopsy of the suspicious enhancing mass within the upper-outer quadrant of the LEFT breast (anterior lesion). Cylinder shaped clip placed at the biopsy site. No apparent complications. 2. MRI guided biopsy of the suspicious enhancing mass within the upper-outer quadrant of the LEFT breast (posterior lesion). Barbell shaped clip placed at the biopsy site. No apparent complications. Electronically Signed: By: Franki Cabot M.D. 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 There is a microscopic focus of intralymphatic tumor. This was found to be concordant by Dr. Franki Cabot. Pathology revealed GRADE II INVASIVE AND IN SITU DUCTAL CARCINOMA of the LEFT breast, upper outer quadrant, posterior lesion, (barbell clip). This was found to be concordant by Dr. Franki Cabot. Pathology results were discussed with the patient by telephone. The patient reported doing well after the biopsies with tenderness at the sites. Post biopsy instructions and care were reviewed and questions  were answered. The patient was encouraged to call [facility] for any additional concerns. My direct phone number was provided. The patient has a recent diagnosis of LEFT breast cancer and should follow her outlined treatment plan. Dr. Stark Klein and Dr. [name] were notified of biopsy results via EPIC message on February 14, 2021. Pathology results reported by Terie Purser, RN on 02/14/2021. Electronically Signed   By: Franki Cabot M.D.   On: 02/14/2021 14:50   Result Date: 02/14/2021 CLINICAL DATA:  Recent ultrasound-guided biopsy revealed an invasive mammary carcinoma in the LEFT breast at the 2:30 o'clock axis. Subsequent MRI demonstrated 2 suspicious masses adjacent to each other within the upper central LEFT breast warranting tissue diagnosis. Patient presents today for MRI  guided biopsies of both sites. These biopsies will be labeled as follows: Site 1: Upper-outer quadrant, anterior Site 2: Upper-outer quadrant, posterior EXAM: MRI GUIDED CORE NEEDLE BIOPSY OF THE LEFT BREAST x2 TECHNIQUE: Multiplanar, multisequence MR imaging of the LEFT breast was performed both before and after administration of intravenous contrast. CONTRAST:  10 cc Gadavist COMPARISON:  Previous exams. FINDINGS: I met with the patient, and we discussed the procedure of MRI guided biopsy, including risks, benefits, and alternatives. Specifically, we discussed the risks of infection, bleeding, tissue injury, clip migration, and inadequate sampling. Informed, written consent was given. The usual time out protocol was performed immediately prior to the procedure. Site 1: Using sterile technique, 1% Lidocaine, MRI guidance, and a 9 gauge vacuum assisted device, biopsy was performed of the enhancing mass within the upper-outer quadrant of the LEFT breast (anterior lesion) using a lateral approach. At the conclusion of the procedure, a cylinder tissue marker clip was deployed into the biopsy cavity. Site 2:  Using sterile technique, 1% Lidocaine, MRI guidance, and a 9 gauge vacuum assisted device, biopsy was performed of the additional enhancing mass within the upper-outer quadrant of the LEFT breast (posterior lesion) using a lateral approach. At the conclusion of the procedure, a barbell tissue marker clip was deployed into the biopsy cavity. Follow-up 2-view mammogram was performed and dictated separately. IMPRESSION: 1. MRI guided biopsy of the suspicious enhancing mass within the upper-outer quadrant of the LEFT breast (anterior lesion). Cylinder shaped clip placed at the biopsy site. No apparent complications. 2. MRI guided biopsy of the suspicious enhancing mass within the upper-outer quadrant of the LEFT breast (posterior lesion). Barbell shaped clip placed at the biopsy site. No apparent complications. Electronically Signed: By: Franki Cabot M.D. On: 02/13/2021 08:40      IMPRESSION/PLAN: 1. Stage IB. cT1cN1M0 grade 2, ER/PR positive invasive ductal carcinoma of the left breast. Dr. Lisbeth Renshaw discusses the pathology findings and reviews the nature of early stage breast disease. She is planning bilateral mastectomies with left targeted node dissection. She desires surgical ovarian supression at the time of her mastectomies, but she is awaiting the scheduling of this. She is also going to meet with Dr. Iran Planas to discuss reconstruction. Dr. Burr Medico also plans for Mammaprint Testing to determine the role of chemotherapy.  Provided that chemotherapy is not indicated, the patient's course would then be followed by external radiotherapy to the breast  to reduce risks of local recurrence followed by antiestrogen therapy. We discussed the risks, benefits, short, and long term effects of radiotherapy, as well as the curative intent, and the patient is interested in proceeding. Dr. Lisbeth Renshaw discusses the delivery and logistics of radiotherapy and anticipates a course of 6 1/2  weeks of radiotherapy. We will see her back a  few weeks after surgery to discuss the simulation process and anticipate we starting radiotherapy after conclusion of her tissue expander expansion, about 6-8 weeks after surgery.  2. Contraceptive Counseling. The patient will have bilateral salpingo-oophorectomy with her upcoming breast surgery. She will not need pregnancy testing prior to radiotherapy.   In a visit lasting 60 minutes, greater than 50% of the time was spent face to face reviewing her case, as well as in preparation of, discussing, and coordinating the patient's care.  The above documentation reflects my direct findings during this shared patient visit. Please see the separate note by Dr. Lisbeth Renshaw on this date for the remainder of the patient's plan of care.    Carola Rhine, PAC    **  Disclaimer: This note was dictated with voice recognition software. Similar sounding words can inadvertently be transcribed and this note may contain transcription errors which may not have been corrected upon publication of note.**

## 2021-02-18 NOTE — Telephone Encounter (Signed)
Received order for Mammarpint testing. Requisition sent to [facility] and [place]

## 2021-02-18 NOTE — Progress Notes (Signed)
HPI:   Diana Weaver was previously seen in the [place] clinic due to a personal history of breast cancer and concerns regarding a hereditary predisposition to cancer. Please refer to our prior cancer genetics clinic note for more information regarding our discussion, assessment and recommendations, at the time. Diana Weaver recent genetic test results were disclosed to her, as were recommendations warranted by these results. These results and recommendations are discussed in more detail below.  CANCER HISTORY:  Oncology History Overview Note   Cancer Staging  Malignant neoplasm of upper-outer quadrant of left breast in female, estrogen receptor positive ([place]) Staging form: Breast, AJCC 8th Edition - Clinical stage from 01/21/2021: Stage IB (cT1c, cN1, cM0, G2, ER+, PR+, HER2-) - Signed by [name], MD on 02/04/2021    Malignant neoplasm of upper-outer quadrant of left breast in female, estrogen receptor positive ([place])  01/14/2021 Mammogram   EXAM: DIGITAL DIAGNOSTIC BILATERAL MAMMOGRAM WITH TOMOSYNTHESIS AND CAD; ULTRASOUND LEFT BREAST LIMITED  IMPRESSION: 1. Suspicious left breast mass at the 2:30 position 9 cm from the nipple corresponding with the screening mammographic findings. Recommendation is for ultrasound-guided biopsy. 2. Single, morphologically abnormal left axillary lymph node. Recommendation is for ultrasound-guided biopsy. 3. No persistent, suspicious findings in the right breast.   01/21/2021 Cancer Staging   Staging form: Breast, AJCC 8th Edition - Clinical stage from 01/21/2021: Stage IB (cT1c, cN1, cM0, G2, ER+, PR+, HER2-) - Signed by [name], MD on 02/04/2021 Stage prefix: Initial diagnosis Histologic grading system: 3 grade system    01/21/2021 Pathology Results   Diagnosis 1. Breast, left, needle core biopsy, 2:30 o'clock, ribbon, 9cmfn - INVASIVE DUCTAL CARCINOMA - DUCTAL CARCINOMA IN SITU - SEE COMMENT 2. Lymph node, needle/core  biopsy, left axillary node, tribell - METASTATIC CARCINOMA INVOLVING NODAL TISSUE Microscopic Comment 1. Based on the biopsy, the carcinoma appears Nottingham grade 2 of 3 and measures 1.1 cm in greatest linear extent.  1. PROGNOSTIC INDICATORS Results: The tumor cells are EQUIVOCAL for Her2 (2+). Her2 by FISH will be performed and results reported separately. Estrogen Receptor: 100%, POSITIVE, STRONG STAINING INTENSITY Progesterone Receptor: 100%, POSITIVE, STRONG STAINING INTENSITY Proliferation Marker Ki67: 15% 1. FLUORESCENCE IN-SITU HYBRIDIZATION Results: GROUP 5: HER2 **NEGATIVE**  2. PROGNOSTIC INDICATORS Results: The tumor cells are EQUIVOCAL for Her2 (2+). Her2 by FISH will be performed and results reported separately Estrogen Receptor: 100%, POSITIVE, STRONG STAINING INTENSITY Progesterone Receptor: 100%, POSITIVE, STRONG STAINING INTENSITY 2. FLUORESCENCE IN-SITU HYBRIDIZATION Results: GROUP 5: HER2 **NEGATIVE**   02/04/2021 Initial Diagnosis   Malignant neoplasm of upper-outer quadrant of left breast in female, estrogen receptor positive ([place])   02/04/2021 Imaging   EXAM: BILATERAL BREAST MRI WITH AND WITHOUT CONTRAST  IMPRESSION: 1. Known malignancy in the LATERAL portion of the LEFT breast measures 3.0 centimeters on MRI. 2. Single enlarged LEFT axillary lymph node represents the recently biopsied node with metastatic carcinoma. 3. Two additional suspicious masses in the UPPER central LEFT breast warranting tissue diagnosis. Given the very heterogeneous appearance of the LEFT breast, with numerous cysts, MR guided core biopsy is indicated for optimal targeting. 4. RIGHT breast is negative.   02/13/2021 Pathology Results   Diagnosis 1. Breast, left, needle core biopsy, upper outer quadrant(anterior lesion) - FOCAL INTRAVASCULAR TUMOR. - DUCTAL PAPILLOMA. - COLUMNAR CELL CHANGE WITH CALCIFICATIONS. - PSEUDOANGIOMATOUS STROMAL HYPERPLASIA ([place]). - SEE  MICROSCOPIC DESCRIPTION. 2. Breast, left, needle core biopsy, upper outer quadrant(posterior lesion) - INVASIVE AND IN SITU DUCTAL CARCINOMA, GRADE 2. - SEE MICROSCOPIC  DESCRIPTION.   02/17/2021 Genetic Testing   Negative hereditary cancer genetic testing: no pathogenic variants detected in [facility] CancerNext-Expanded +RNAinsight Panel.  The report date is 02/17/2021.    The CancerNext-Expanded gene panel offered by [facility] and includes sequencing, rearrangement, and RNA analysis for the following 77 genes: AIP, ALK, APC, ATM, AXIN2, BAP1, BARD1, BLM, BMPR1A, BRCA1, BRCA2, BRIP1, CDC73, CDH1, CDK4, CDKN1B, CDKN2A, CHEK2, CTNNA1, DICER1, FANCC, FH, FLCN, GALNT12, KIF1B, LZTR1, MAX, MEN1, MET, MLH1, MSH2, MSH3, MSH6, MUTYH, NBN, NF1, NF2, NTHL1, PALB2, PHOX2B, PMS2, POT1, PRKAR1A, PTCH1, PTEN, RAD51C, RAD51D, RB1, RECQL, RET, SDHA, SDHAF2, SDHB, SDHC, SDHD, SMAD4, SMARCA4, SMARCB1, SMARCE1, STK11, SUFU, TMEM127, TP53, TSC1, TSC2, VHL and XRCC2 (sequencing and deletion/duplication); EGFR, EGLN1, HOXB13, KIT, MITF, PDGFRA, POLD1, and POLE (sequencing only); EPCAM and GREM1 (deletion/duplication only).      FAMILY HISTORY:  We obtained a detailed, 4-generation family history.  Significant diagnoses are listed below.  Family History  Problem Relation Age of Onset   Melanoma Mother 53        forehead; surgery only   Cancer Maternal Grandfather          oral cancer; d. early 67s   Hodgkin's lymphoma Paternal Grandfather          d. <60?   Throat cancer Cousin          maternal female cousin; dx 74s    Diana Weaver is unaware of previous family history of genetic testing for hereditary cancer risks. Patient's maternal ancestors are of [place] descent, and paternal ancestors are of Native American descent. There is no reported Ashkenazi Jewish ancestry. There is no known consanguinity.  GENETIC TEST RESULTS:  The [facility] CancerNext-Expanded +RNAinsight Panel found no pathogenic mutations. The  CancerNext-Expanded gene panel offered by [facility] and includes sequencing, rearrangement, and RNA analysis for the following 77 genes: AIP, ALK, APC, ATM, AXIN2, BAP1, BARD1, BLM, BMPR1A, BRCA1, BRCA2, BRIP1, CDC73, CDH1, CDK4, CDKN1B, CDKN2A, CHEK2, CTNNA1, DICER1, FANCC, FH, FLCN, GALNT12, KIF1B, LZTR1, MAX, MEN1, MET, MLH1, MSH2, MSH3, MSH6, MUTYH, NBN, NF1, NF2, NTHL1, PALB2, PHOX2B, PMS2, POT1, PRKAR1A, PTCH1, PTEN, RAD51C, RAD51D, RB1, RECQL, RET, SDHA, SDHAF2, SDHB, SDHC, SDHD, SMAD4, SMARCA4, SMARCB1, SMARCE1, STK11, SUFU, TMEM127, TP53, TSC1, TSC2, VHL and XRCC2 (sequencing and deletion/duplication); EGFR, EGLN1, HOXB13, KIT, MITF, PDGFRA, POLD1, and POLE (sequencing only); EPCAM and GREM1 (deletion/duplication only).    The test report has been scanned into EPIC and is located under the Molecular Pathology section of the Results Review tab.  A portion of the result report is included below for reference. Genetic testing reported out on February 17, 2021.      Even though a pathogenic variant was not identified, possible explanations for the cancer in the family may include: There may be no hereditary risk for cancer in the family. The cancers in Diana Weaver and/or her family may be sporadic/familial or due to other genetic and environmental factors. There may be a gene mutation in one of these genes that current testing methods cannot detect but that chance is small. There could be another gene that has not yet been discovered, or that we have not yet tested, that is responsible for the cancer diagnoses in the family.  It is also possible there is a hereditary cause for the cancer in the family that Diana Weaver did not inherit.   Therefore, it is important to remain in touch with cancer genetics in the future so that we can continue to offer Diana Weaver the  most up to date genetic testing.    ADDITIONAL GENETIC TESTING:  We discussed with Diana Weaver that her genetic testing  was fairly extensive.  If there are genes identified to increase cancer risk that can be analyzed in the future, we would be happy to discuss and coordinate this testing at that time.    CANCER SCREENING RECOMMENDATIONS:  Diana Weaver test result is considered negative (normal).  This means that we have not identified a hereditary cause for her personal history of cancer at this time.   An individual's cancer risk and medical management are not determined by genetic test results alone. Overall cancer risk assessment incorporates additional factors, including personal medical history, family history, and any available genetic information that may result in a personalized plan for cancer prevention and surveillance. Therefore, it is recommended she continue to follow the cancer management and screening guidelines provided by her oncology and primary healthcare provider.  RECOMMENDATIONS FOR FAMILY MEMBERS:   Since she did not inherit a identifiable mutation in a cancer predisposition gene included on this panel, her children could not have inherited a known mutation from her in one of these genes. Individuals in this family might be at some increased risk of developing cancer, over the general population risk, due to the family history of cancer.  Individuals in the family should notify their providers of the family history of cancer. We recommend women in this family have a yearly mammogram beginning at age 70, or 69 years younger than the earliest onset of cancer, an annual clinical breast exam, and perform monthly breast self-exams.     FOLLOW-UP:  Lastly, we discussed with Diana Weaver that cancer genetics is a rapidly advancing field and it is possible that new genetic tests will be appropriate for her and/or her family members in the future. We encouraged her to remain in contact with cancer genetics on an annual basis so we can update her personal and family histories and let her know of advances  in cancer genetics that may benefit this family.   Our contact number was provided. Diana Weaver questions were answered to her satisfaction, and she knows she is welcome to call us at anytime with additional questions or concerns.   Retina Bernardy M. Joette Catching, [place], [facility] Genetic Counselor [email or web address] (P) [phone]

## 2021-02-18 NOTE — Telephone Encounter (Signed)
Revealed negative genetic testing.  Discussed that we do not know why she has breast cancer or why there is cancer in the family. It could be sporadic/familial, due to a different gene that we are not testing, or maybe our current technology may not be able to pick something up.  It will be important for her to keep in contact with genetics to keep up with whether additional testing may be needed.   

## 2021-02-19 DIAGNOSIS — Z170 Estrogen receptor positive status [ER+]: Secondary | ICD-10-CM | POA: Diagnosis not present

## 2021-02-19 DIAGNOSIS — C50412 Malignant neoplasm of upper-outer quadrant of left female breast: Secondary | ICD-10-CM | POA: Diagnosis not present

## 2021-02-20 ENCOUNTER — Encounter: Payer: Self-pay | Admitting: *Deleted

## 2021-02-20 ENCOUNTER — Ambulatory Visit: Payer: Federal, State, Local not specified - PPO | Admitting: Hematology

## 2021-02-21 ENCOUNTER — Telehealth: Payer: Self-pay | Admitting: *Deleted

## 2021-02-21 MED ORDER — TAMOXIFEN CITRATE 20 MG PO TABS: 20.0000 mg | ORAL_TABLET | Freq: Every day | ORAL | 0 refills | Status: DC

## 2021-02-21 NOTE — Telephone Encounter (Signed)
Per Dr Burr Medico pt recommended to start taking Tamoxfien. Pt agreeable. Verified pharmacy.

## 2021-02-24 ENCOUNTER — Other Ambulatory Visit: Payer: Self-pay | Admitting: General Surgery

## 2021-02-24 DIAGNOSIS — Z170 Estrogen receptor positive status [ER+]: Secondary | ICD-10-CM

## 2021-03-06 ENCOUNTER — Telehealth: Payer: Self-pay | Admitting: *Deleted

## 2021-03-06 ENCOUNTER — Encounter: Payer: Self-pay | Admitting: *Deleted

## 2021-03-06 DIAGNOSIS — C50412 Malignant neoplasm of upper-outer quadrant of left female breast: Secondary | ICD-10-CM

## 2021-03-06 NOTE — Telephone Encounter (Signed)
Received Mammaprint results of Low Risk. Physician team notified. Called pt discussed results and that there are no recommendations for chemo. Informed pt to stop Tamoxifen 1 wk prior to surgery. Received verbal understanding.

## 2021-03-07 ENCOUNTER — Encounter: Payer: Self-pay | Admitting: Hematology

## 2021-03-07 ENCOUNTER — Telehealth: Payer: Self-pay | Admitting: Radiation Oncology

## 2021-03-11 DIAGNOSIS — Z853 Personal history of malignant neoplasm of breast: Secondary | ICD-10-CM | POA: Diagnosis not present

## 2021-03-21 NOTE — Progress Notes (Signed)
Surgical Instructions    Your procedure is scheduled on Thursday, January 19th, 2023.   Report to [facility] Main Entrance "A" at 05:30 A.M., then check in with the Admitting office.  Call this number if you have problems the morning of surgery:  [phone]   If you have any questions prior to your surgery date call [phone]: Open Monday-Friday 8am-4pm    Remember:  Do not eat after midnight the night before your surgery  You may drink clear liquids until 04:30 the morning of your surgery.   Clear liquids allowed are: Water, Non-Citrus Juices (without pulp), Carbonated Beverages, Clear Tea, Black Coffee ONLY (NO MILK, CREAM OR POWDERED CREAMER of any kind), and Gatorade  Patient Instructions  The night before surgery:  No food after midnight. ONLY clear liquids after midnight  The day of surgery (if you do NOT have diabetes):  Drink ONE (1) Pre-Surgery Clear Ensure by 04:30 the morning of surgery. Drink in one sitting. Do not sip.  This drink was given to you during your hospital  pre-op appointment visit.  Nothing else to drink after completing the  Pre-Surgery Clear Ensure.          If you have questions, please contact your surgeons office.     Take these medicines the morning of surgery with A SIP OF WATER: NONE    As of today, STOP taking any Aspirin (unless otherwise instructed by your surgeon) Aleve, Naproxen, Ibuprofen, Motrin, Advil, Goody's, BC's, all herbal medications, fish oil, and all vitamins.   After your COVID test   You are not required to quarantine however you are required to wear a well-fitting mask when you are out and around people not in your household.  If your mask becomes wet or soiled, replace with a new one.  Wash your hands often with soap and water for 20 seconds or clean your hands with an alcohol-based hand sanitizer that contains at least 60% alcohol.  Do not share personal items.  Notify your provider: if you are in close  contact with someone who has COVID  or if you develop a fever of 100.4 or greater, sneezing, cough, sore throat, shortness of breath or body aches.    The day of surgery:          Do not wear jewelry  Do not wear lotions, powders, perfumes, or deodorant. Do not shave 48 hours prior to surgery.   Do not bring valuables to the hospital. DO Not wear nail polish, gel polish, artificial nails, or any other type of covering on natural nails including finger and toenails. If patients have artificial nails, gel coating, etc. that need to be removed by a nail salon, please have this removed prior to surgery or surgery may need to be canceled/delayed if the surgeon/ anesthesia feels like the patient is unable to be adequately monitored.               is not responsible for any belongings or valuables.  Do NOT Smoke (Tobacco/Vaping)  24 hours prior to your procedure  If you use a CPAP at night, you may bring your mask for your overnight stay.   Contacts, glasses, hearing aids, dentures or partials may not be worn into surgery, please bring cases for these belongings   For patients admitted to the hospital, discharge time will be determined by your treatment team.   Patients discharged the day of surgery will not be allowed to drive home, and someone needs  to stay with them for 24 hours.  NO VISITORS WILL BE ALLOWED IN PRE-OP WHERE PATIENTS ARE PREPPED FOR SURGERY.  ONLY 1 SUPPORT PERSON MAY BE PRESENT IN THE WAITING ROOM WHILE YOU ARE IN SURGERY.  IF YOU ARE TO BE ADMITTED, ONCE YOU ARE IN YOUR ROOM YOU WILL BE ALLOWED TWO (2) VISITORS. 1 (ONE) VISITOR MAY STAY OVERNIGHT BUT MUST ARRIVE TO THE ROOM BY 8pm.  Minor children may have two parents present. Special consideration for safety and communication needs will be reviewed on a case by case basis.  Special instructions:    Oral Hygiene is also important to reduce your risk of infection.  Remember - BRUSH YOUR TEETH THE MORNING OF  SURGERY WITH YOUR REGULAR TOOTHPASTE   [place]- Preparing For Surgery  Before surgery, you can play an important role. Because skin is not sterile, your skin needs to be as free of germs as possible. You can reduce the number of germs on your skin by washing with CHG (chlorahexidine gluconate) Soap before surgery.  CHG is an antiseptic cleaner which kills germs and bonds with the skin to continue killing germs even after washing.     Please do not use if you have an allergy to CHG or antibacterial soaps. If your skin becomes reddened/irritated stop using the CHG.  Do not shave (including legs and underarms) for at least 48 hours prior to first CHG shower. It is OK to shave your face.  Please follow these instructions carefully.     Shower the NIGHT BEFORE SURGERY and the MORNING OF SURGERY with CHG Soap.   If you chose to wash your hair, wash your hair first as usual with your normal shampoo. After you shampoo, rinse your hair and body thoroughly to remove the shampoo.  Then [employer] and genitals (private parts) with your normal soap and rinse thoroughly to remove soap.  After that Use CHG Soap as you would any other liquid soap. You can apply CHG directly to the skin and wash gently with a scrungie or a clean washcloth.   Apply the CHG Soap to your body ONLY FROM THE NECK DOWN.  Do not use on open wounds or open sores. Avoid contact with your eyes, ears, mouth and genitals (private parts). Wash Face and genitals (private parts)  with your normal soap.   Wash thoroughly, paying special attention to the area where your surgery will be performed.  Thoroughly rinse your body with warm water from the neck down.  DO NOT shower/wash with your normal soap after using and rinsing off the CHG Soap.  Pat yourself dry with a CLEAN TOWEL.  Wear CLEAN PAJAMAS to bed the night before surgery  Place CLEAN SHEETS on your bed the night before your surgery  DO NOT SLEEP WITH PETS.   Day of  Surgery:  Take a shower with CHG soap. Wear Clean/Comfortable clothing the morning of surgery Do not apply any deodorants/lotions.   Remember to brush your teeth WITH YOUR REGULAR TOOTHPASTE.   Please read over the following fact sheets that you were given.

## 2021-03-24 ENCOUNTER — Encounter (HOSPITAL_COMMUNITY): Payer: Self-pay

## 2021-03-24 ENCOUNTER — Encounter (HOSPITAL_COMMUNITY)
Admission: RE | Admit: 2021-03-24 | Discharge: 2021-03-24 | Disposition: A | Discharge status: Home / Self Care | Payer: BC Managed Care – PPO | Source: Ambulatory Visit | Attending: General Surgery | Admitting: General Surgery

## 2021-03-24 ENCOUNTER — Other Ambulatory Visit: Payer: Self-pay

## 2021-03-24 ENCOUNTER — Encounter: Payer: Self-pay | Admitting: Hematology

## 2021-03-24 VITALS — BP 125/84 | HR 83 | Temp 98.3°F | Resp 17 | Ht 64.0 in | Wt 195.5 lb

## 2021-03-24 DIAGNOSIS — R928 Other abnormal and inconclusive findings on diagnostic imaging of breast: Secondary | ICD-10-CM | POA: Diagnosis not present

## 2021-03-24 DIAGNOSIS — N809 Endometriosis, unspecified: Secondary | ICD-10-CM | POA: Diagnosis not present

## 2021-03-24 DIAGNOSIS — R590 Localized enlarged lymph nodes: Secondary | ICD-10-CM | POA: Diagnosis not present

## 2021-03-24 DIAGNOSIS — N6001 Solitary cyst of right breast: Secondary | ICD-10-CM | POA: Diagnosis not present

## 2021-03-24 DIAGNOSIS — N838 Other noninflammatory disorders of ovary, fallopian tube and broad ligament: Secondary | ICD-10-CM | POA: Diagnosis not present

## 2021-03-24 DIAGNOSIS — C773 Secondary and unspecified malignant neoplasm of axilla and upper limb lymph nodes: Secondary | ICD-10-CM | POA: Diagnosis not present

## 2021-03-24 DIAGNOSIS — G8918 Other acute postprocedural pain: Secondary | ICD-10-CM | POA: Diagnosis not present

## 2021-03-24 DIAGNOSIS — N6011 Diffuse cystic mastopathy of right breast: Secondary | ICD-10-CM | POA: Diagnosis not present

## 2021-03-24 DIAGNOSIS — Z7984 Long term (current) use of oral hypoglycemic drugs: Secondary | ICD-10-CM | POA: Diagnosis not present

## 2021-03-24 DIAGNOSIS — N6002 Solitary cyst of left breast: Secondary | ICD-10-CM | POA: Diagnosis not present

## 2021-03-24 DIAGNOSIS — C50112 Malignant neoplasm of central portion of left female breast: Secondary | ICD-10-CM | POA: Insufficient documentation

## 2021-03-24 DIAGNOSIS — Z888 Allergy status to other drugs, medicaments and biological substances status: Secondary | ICD-10-CM | POA: Diagnosis not present

## 2021-03-24 DIAGNOSIS — R7303 Prediabetes: Secondary | ICD-10-CM | POA: Insufficient documentation

## 2021-03-24 DIAGNOSIS — D0512 Intraductal carcinoma in situ of left breast: Secondary | ICD-10-CM | POA: Diagnosis not present

## 2021-03-24 DIAGNOSIS — Z01812 Encounter for preprocedural laboratory examination: Secondary | ICD-10-CM | POA: Insufficient documentation

## 2021-03-24 DIAGNOSIS — Z170 Estrogen receptor positive status [ER+]: Secondary | ICD-10-CM

## 2021-03-24 DIAGNOSIS — N83202 Unspecified ovarian cyst, left side: Secondary | ICD-10-CM | POA: Diagnosis not present

## 2021-03-24 DIAGNOSIS — Z1502 Genetic susceptibility to malignant neoplasm of ovary: Secondary | ICD-10-CM | POA: Diagnosis not present

## 2021-03-24 DIAGNOSIS — Z4002 Encounter for prophylactic removal of ovary: Secondary | ICD-10-CM | POA: Diagnosis not present

## 2021-03-24 DIAGNOSIS — Z91040 Latex allergy status: Secondary | ICD-10-CM | POA: Diagnosis not present

## 2021-03-24 DIAGNOSIS — Z20822 Contact with and (suspected) exposure to covid-19: Secondary | ICD-10-CM | POA: Insufficient documentation

## 2021-03-24 DIAGNOSIS — Z01818 Encounter for other preprocedural examination: Secondary | ICD-10-CM

## 2021-03-24 DIAGNOSIS — Z808 Family history of malignant neoplasm of other organs or systems: Secondary | ICD-10-CM | POA: Diagnosis not present

## 2021-03-24 DIAGNOSIS — C50912 Malignant neoplasm of unspecified site of left female breast: Secondary | ICD-10-CM | POA: Diagnosis not present

## 2021-03-24 DIAGNOSIS — R921 Mammographic calcification found on diagnostic imaging of breast: Secondary | ICD-10-CM | POA: Diagnosis not present

## 2021-03-24 DIAGNOSIS — Z87891 Personal history of nicotine dependence: Secondary | ICD-10-CM | POA: Diagnosis not present

## 2021-03-24 DIAGNOSIS — C50412 Malignant neoplasm of upper-outer quadrant of left female breast: Secondary | ICD-10-CM | POA: Diagnosis not present

## 2021-03-24 DIAGNOSIS — D241 Benign neoplasm of right breast: Secondary | ICD-10-CM | POA: Diagnosis not present

## 2021-03-24 HISTORY — DX: Prediabetes: R73.03

## 2021-03-24 HISTORY — DX: Personal history of urinary calculi: Z87.442

## 2021-03-24 LAB — CBC
HCT: 44.1 % (ref 36.0–46.0)
Hemoglobin: 13.7 g/dL (ref 12.0–15.0)
MCH: 27.5 pg (ref 26.0–34.0)
MCHC: 31.1 g/dL (ref 30.0–36.0)
MCV: 88.6 fL (ref 80.0–100.0)
Platelets: 324 10*3/uL (ref 150–400)
RBC: 4.98 MIL/uL (ref 3.87–5.11)
RDW: 13.2 % (ref 11.5–15.5)
WBC: 11.1 10*3/uL — ABNORMAL HIGH (ref 4.0–10.5)
nRBC: 0 % (ref 0.0–0.2)

## 2021-03-24 LAB — GLUCOSE, CAPILLARY: Glucose-Capillary: 138 mg/dL — ABNORMAL HIGH (ref 70–99)

## 2021-03-24 LAB — BASIC METABOLIC PANEL
Anion gap: 11 (ref 5–15)
BUN: 15 mg/dL (ref 6–20)
CO2: 24 mmol/L (ref 22–32)
Calcium: 9.4 mg/dL (ref 8.9–10.3)
Chloride: 104 mmol/L (ref 98–111)
Creatinine, Ser: 0.81 mg/dL (ref 0.44–1.00)
GFR, Estimated: >60 mL/min (ref >60)
Glucose, Bld: 122 mg/dL — ABNORMAL HIGH (ref 70–99)
Potassium: 4.9 mmol/L (ref 3.5–5.1)
Sodium: 139 mmol/L (ref 135–145)

## 2021-03-24 LAB — HEMOGLOBIN A1C
Hgb A1c MFr Bld: 6.5 % — ABNORMAL HIGH (ref 4.8–5.6)
Mean Plasma Glucose: 139.85 mg/dL

## 2021-03-24 LAB — SARS CORONAVIRUS 2 (TAT 6-24 HRS): SARS Coronavirus 2: NEGATIVE

## 2021-03-24 LAB — HCG, SERUM, QUALITATIVE: Preg, Serum: NEGATIVE

## 2021-03-24 NOTE — Progress Notes (Addendum)
PCP: Sueanne Margarita, DO Cardiologist: denies  EKG: 06/10/20 CXR: na ECHO: >10 years Stress Test: >10 years Cardiac Cath: denies  Fasting Blood Sugar- does not check glucose Checks Blood Sugar_0__ times a day BG 138 at PAT. Obtained A1C  OSA/CPAP: No  ASA/Blood Thinner: No  Patient stopped tamoxifen 03/19/21  Covid test 03/24/21 at PAT  Seed placement 03/25/21  Anesthesia Review: No.  All cardiac test normal per patient.  Patient denies shortness of breath, fever, cough, and chest pain at PAT appointment.  Patient verbalized understanding of instructions provided today at the PAT appointment.  Patient asked to review instructions at home and day of surgery.

## 2021-03-25 ENCOUNTER — Ambulatory Visit
Admission: RE | Admit: 2021-03-25 | Discharge: 2021-03-25 | Disposition: A | Discharge status: Home / Self Care | Payer: Federal, State, Local not specified - PPO | Source: Ambulatory Visit | Attending: General Surgery | Admitting: General Surgery

## 2021-03-25 ENCOUNTER — Encounter: Payer: Self-pay | Admitting: Hematology

## 2021-03-25 ENCOUNTER — Other Ambulatory Visit: Payer: Self-pay | Admitting: General Surgery

## 2021-03-25 DIAGNOSIS — C50412 Malignant neoplasm of upper-outer quadrant of left female breast: Secondary | ICD-10-CM

## 2021-03-25 DIAGNOSIS — Z170 Estrogen receptor positive status [ER+]: Secondary | ICD-10-CM

## 2021-03-25 DIAGNOSIS — R590 Localized enlarged lymph nodes: Secondary | ICD-10-CM | POA: Diagnosis not present

## 2021-03-25 IMAGING — MG MM BREAST LOCALIZATION CLIP
2 series · 3 of 6 positions shown · non-contrast
Comparison: Previous exam(s).

CLINICAL DATA: Status post seed placement left axillary lymph node

EXAM:
DIAGNOSTIC LEFT MAMMOGRAM POST ULTRASOUND-GUIDED RADIOACTIVE SEED
PLACEMENT

[L MLO synth-2D]
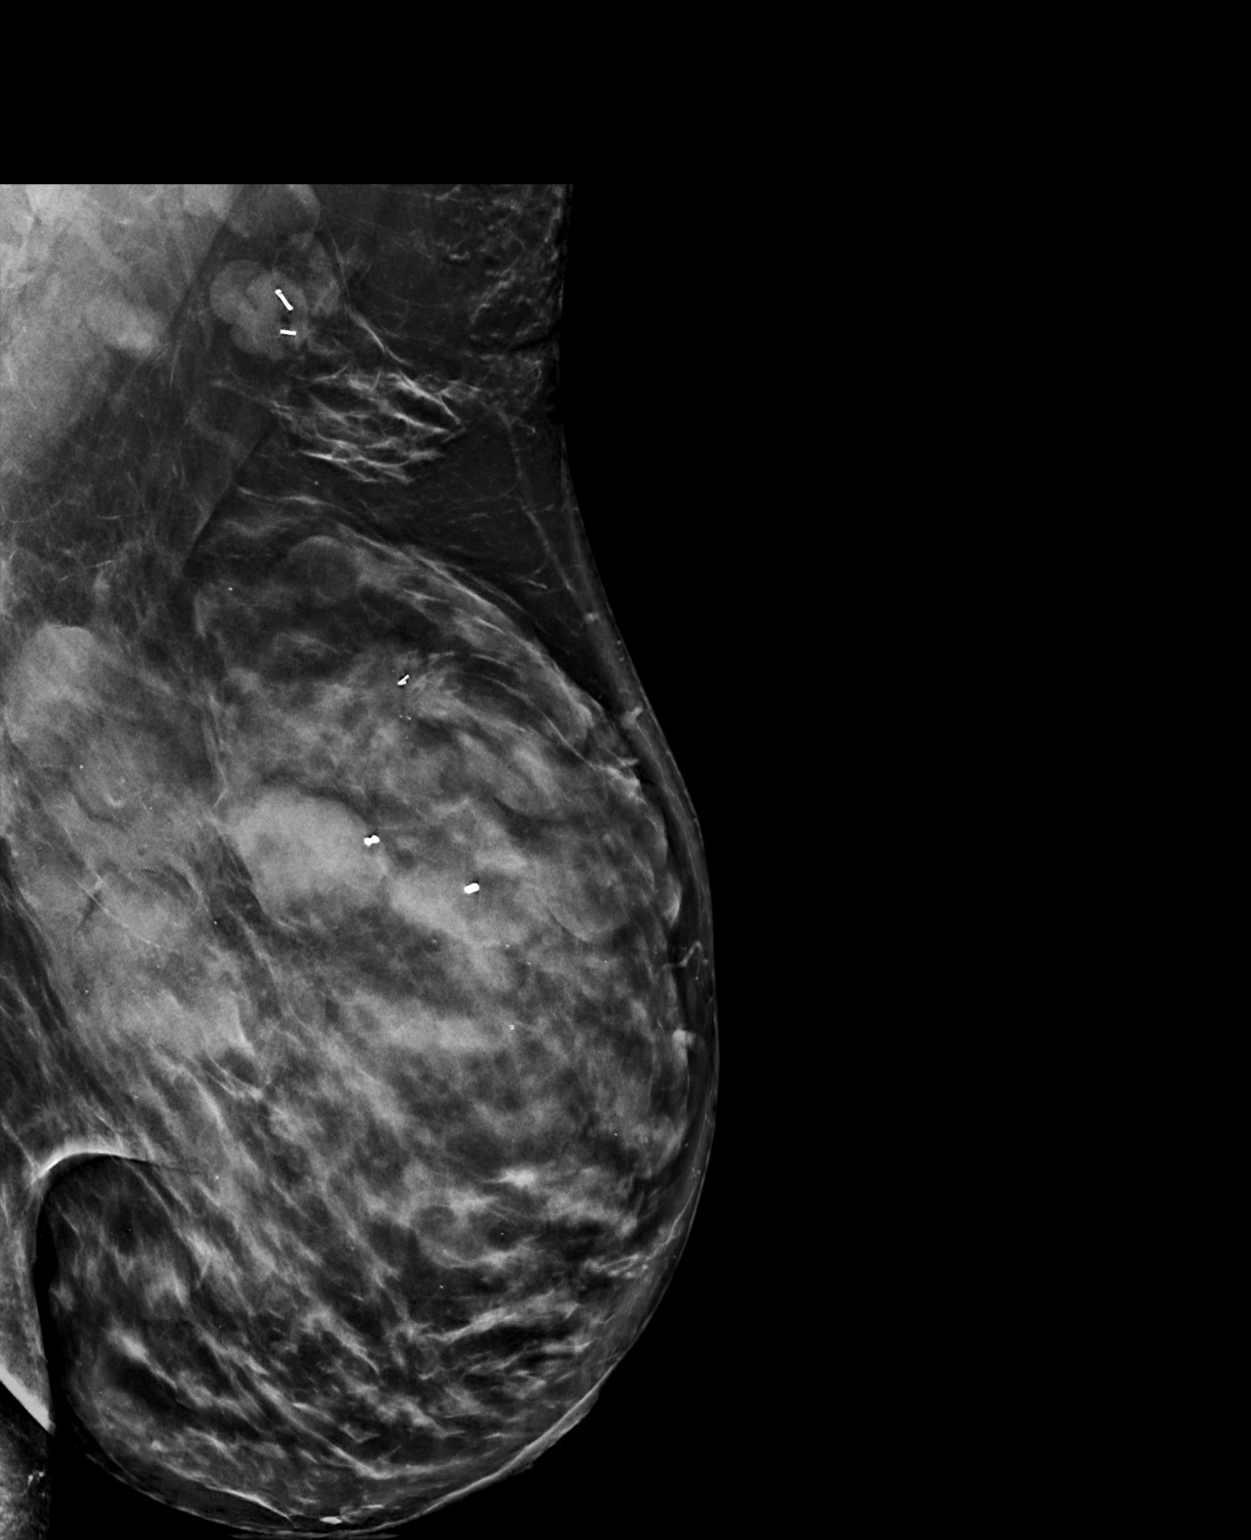

[L MLO tomo · 2 of 100 frames shown]
[frame 33/100]
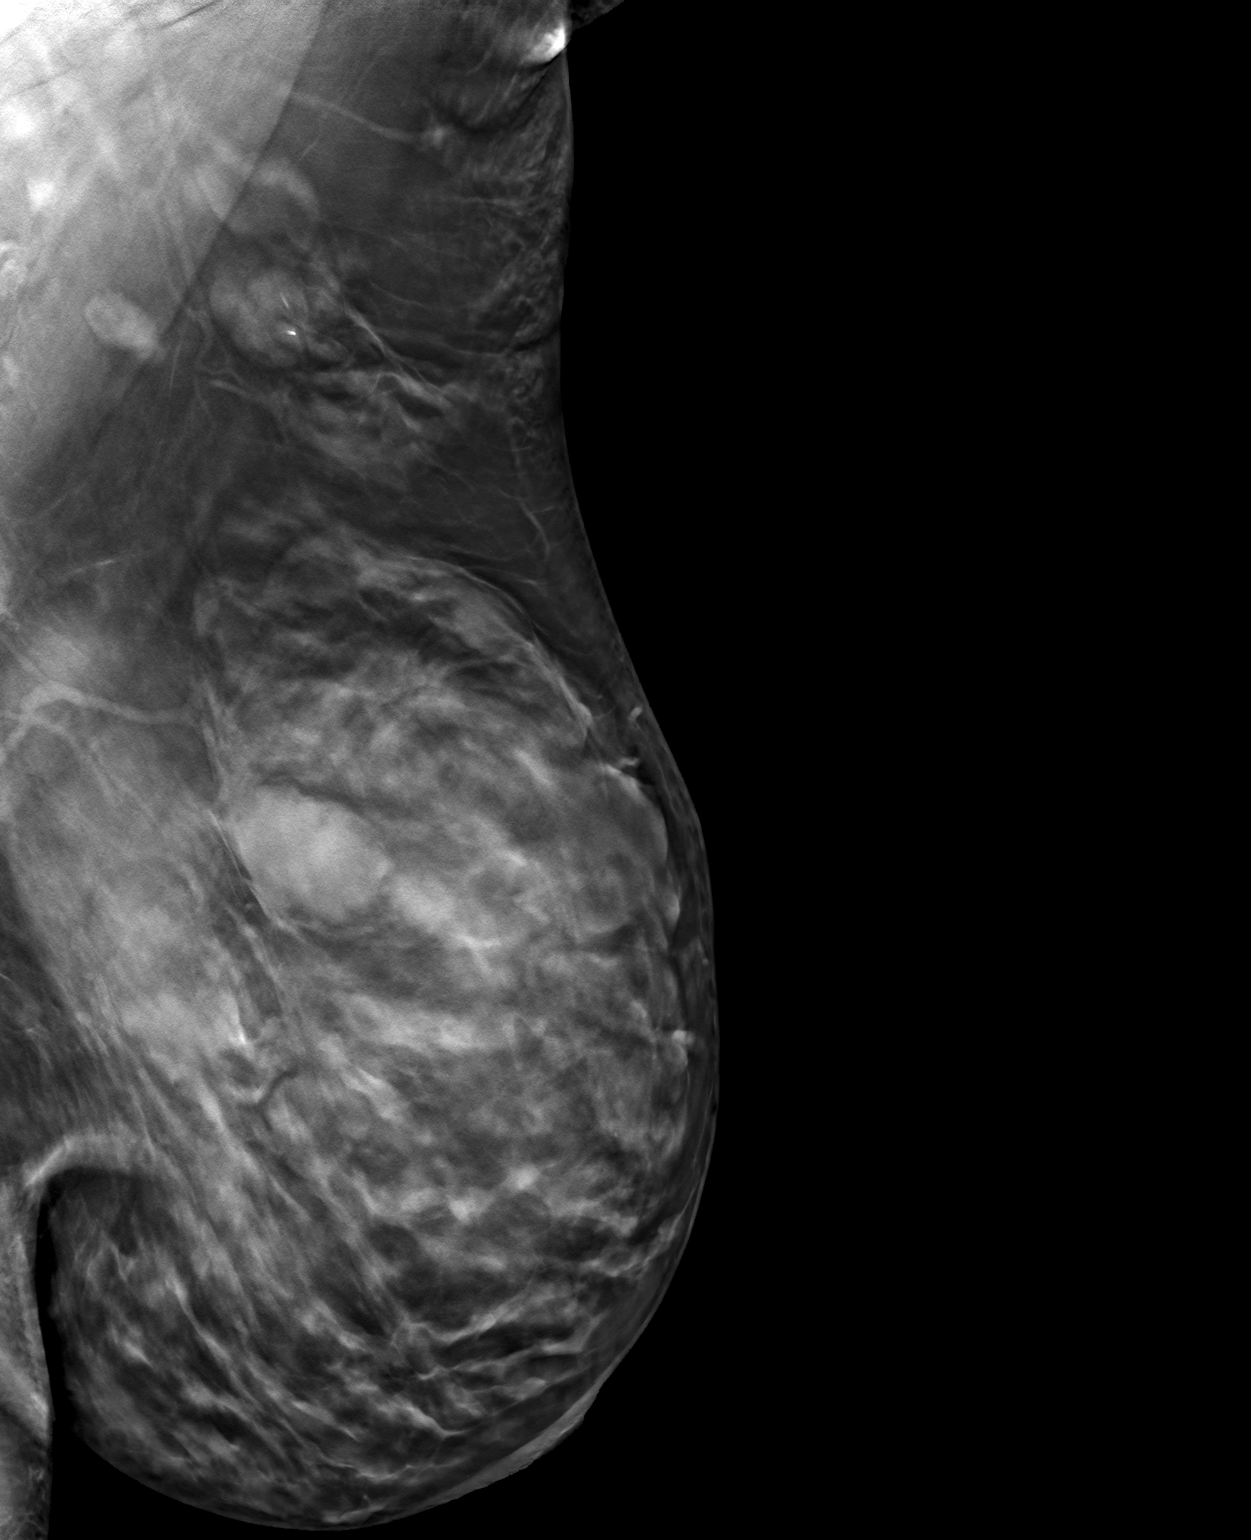
[frame 51/100]
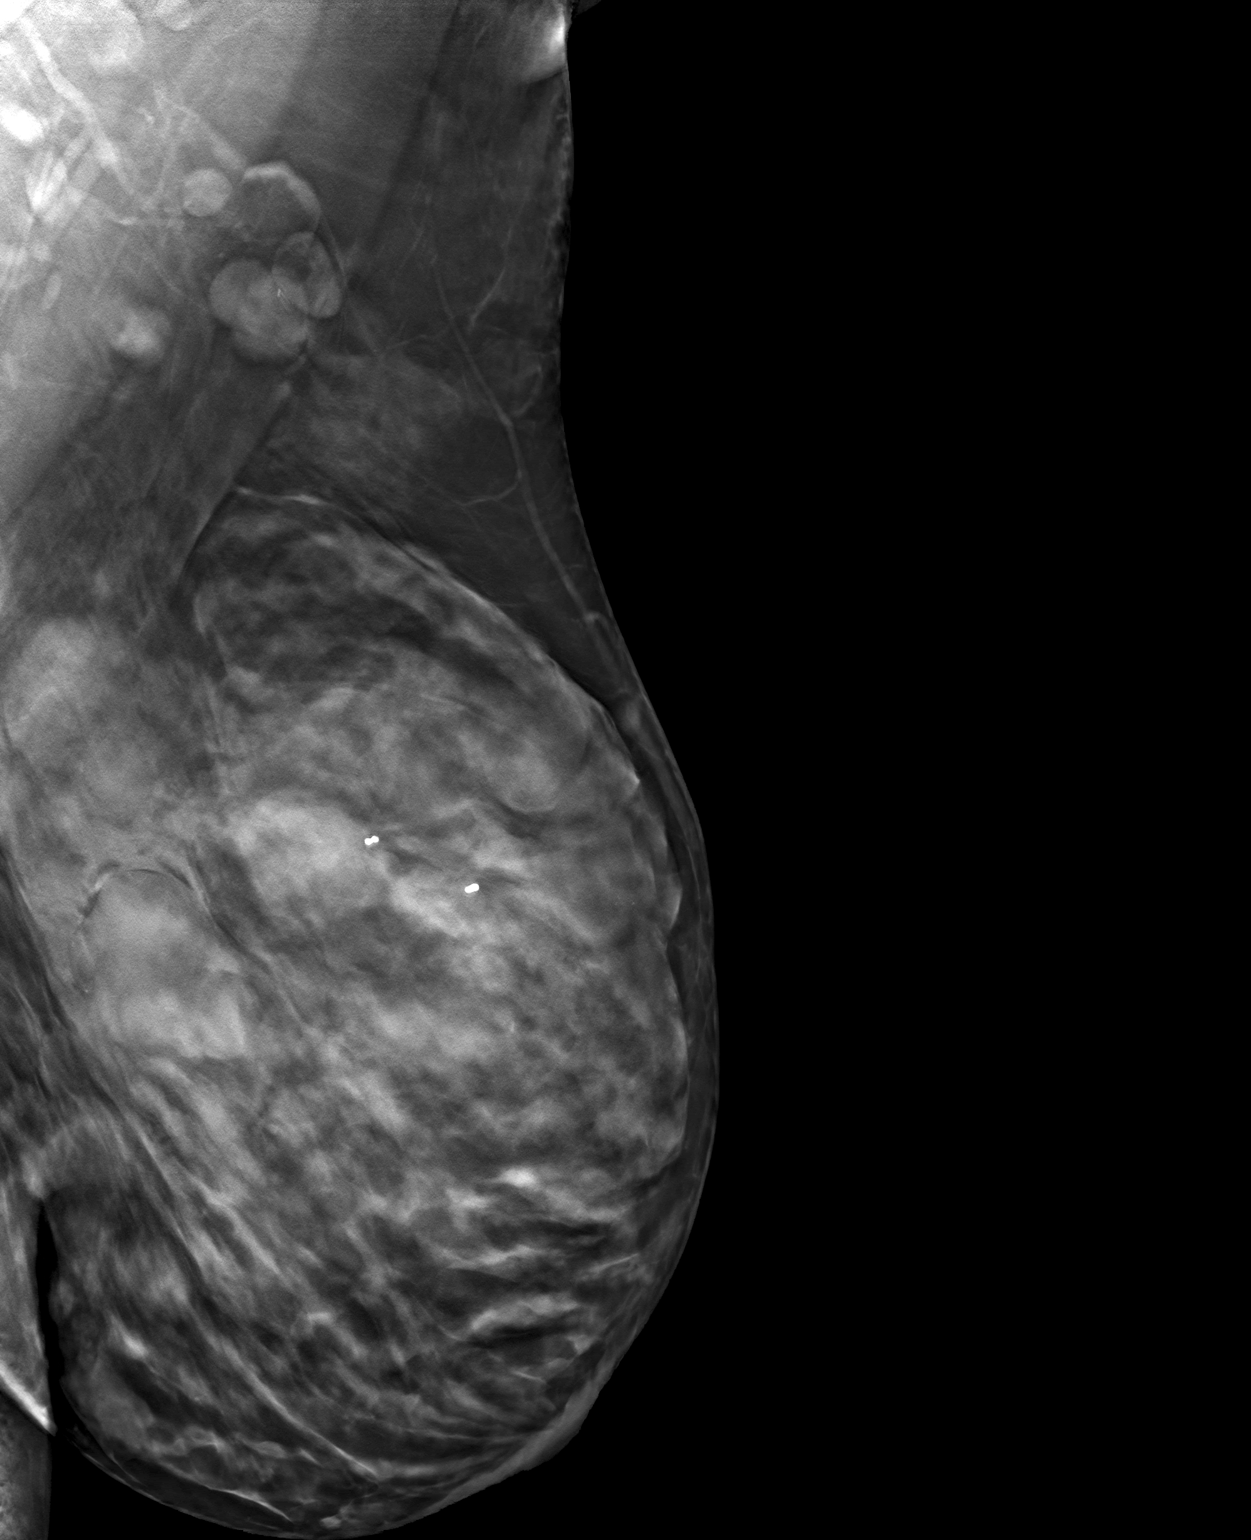

[3 of 6 positions shown; findings below may reference images not displayed]

FINDINGS: Mammographic images were obtained following ultrasound-guided
radioactive seed placement. These demonstrate the radioactive seed
to be located within the left axillary lymph node adjacent to the
biopsy marking clip.
IMPRESSION: Appropriate location of the radioactive seed.

Final Assessment: Post Procedure Mammograms for Seed Placement

## 2021-03-25 IMAGING — US US PLC BREAST LOC DEV 1ST LESION INC US GUIDE*L*
1 series · 4 of 4 positions shown · non-contrast
Comparison: Previous exam(s).

CLINICAL DATA: Patient for preoperative localization of left
axillary lymph node.

EXAM:
ULTRASOUND GUIDED RADIOACTIVE SEED LOCALIZATION OF THE LEFT BREAST

[Series 1: us plc breast loc dev 1st lesion inc us guide*left · 0.07mm/px · 4 of 4 slices shown]
[im 1/4]
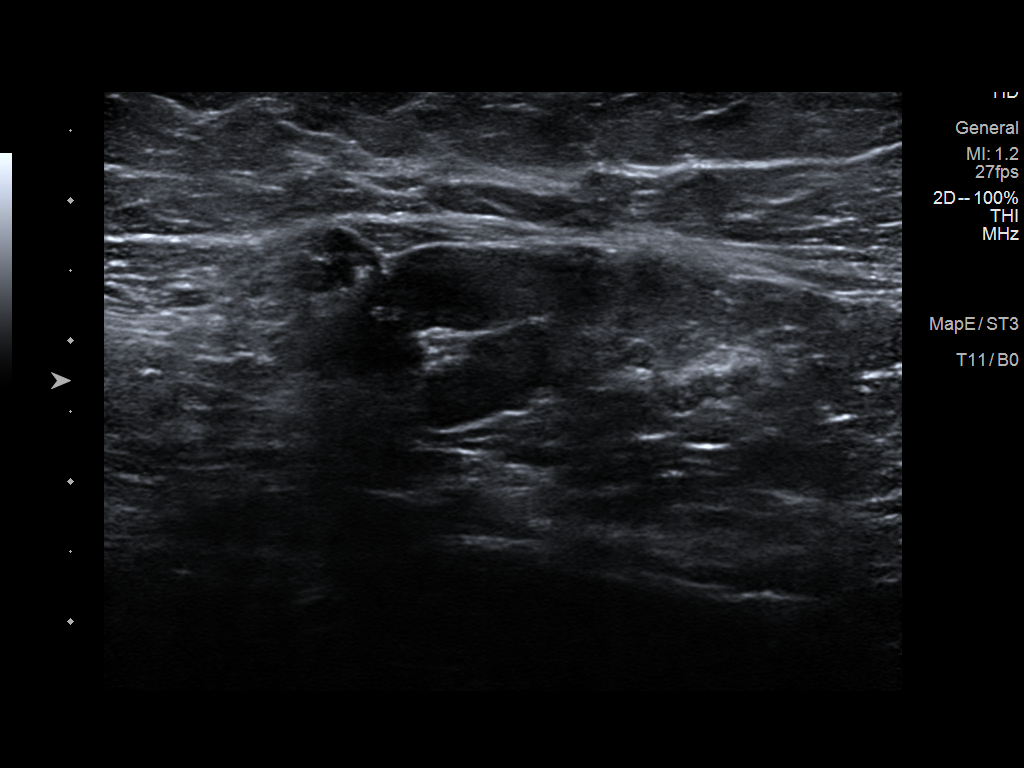
[im 2/4]
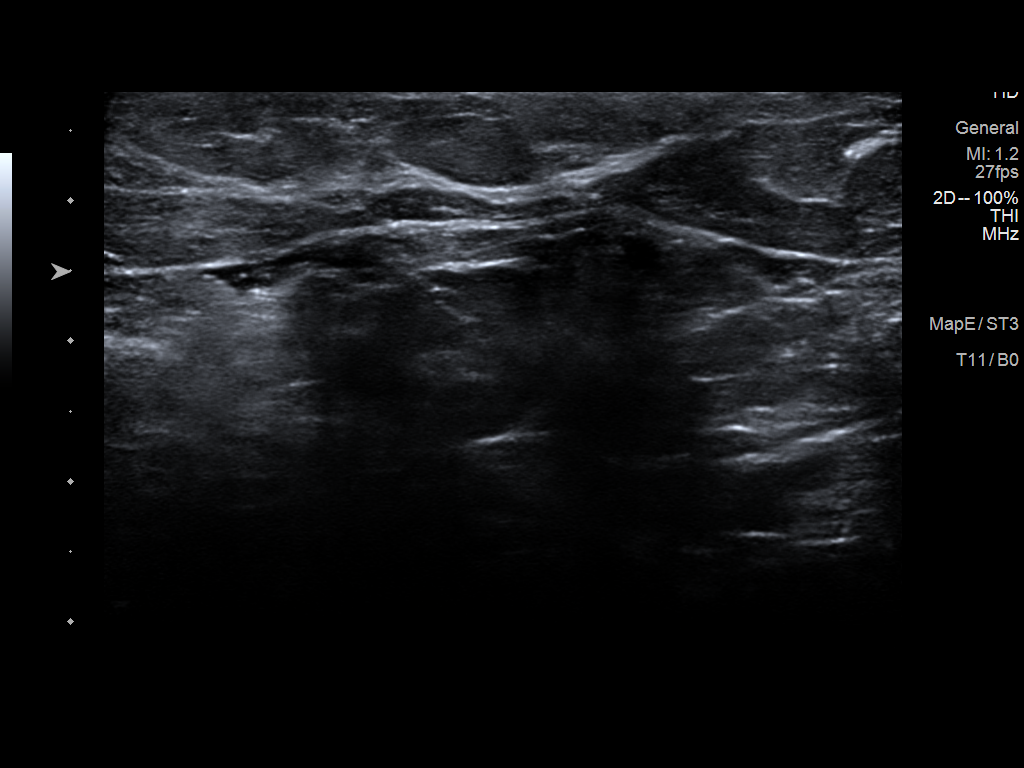
[im 3/4]
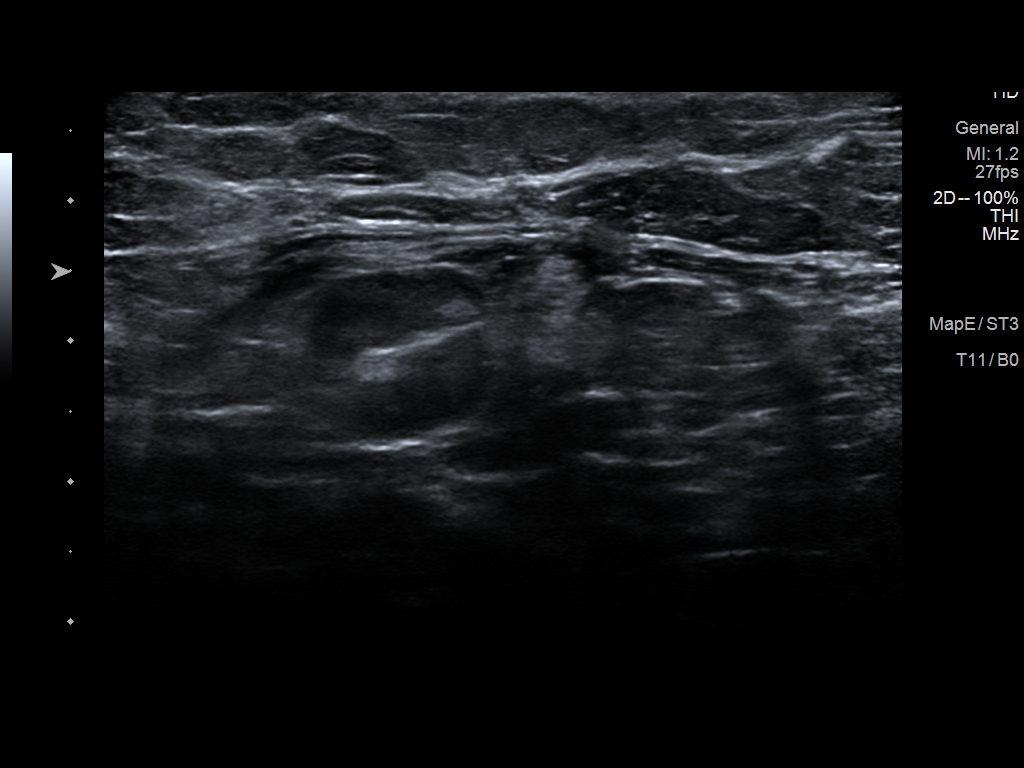
[im 4/4]
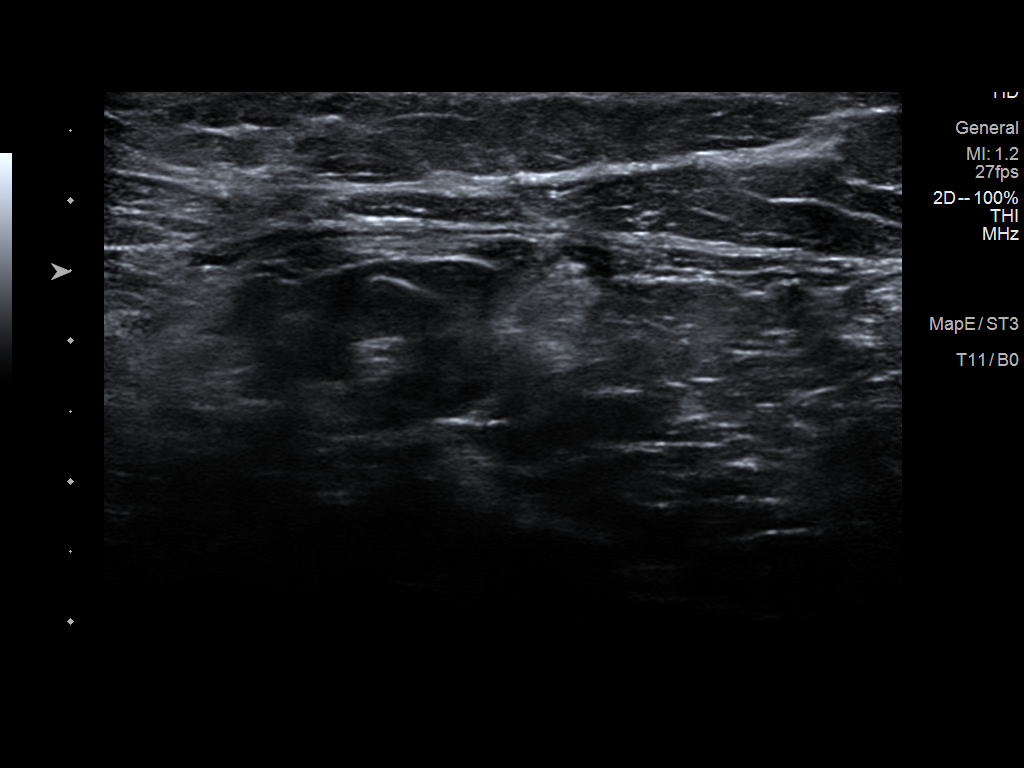

[4 of 4 positions shown; findings below may reference images not displayed]

FINDINGS: Patient presents for radioactive seed localization prior to targeted
left node resection. I met with the patient and we discussed the
procedure of seed localization including benefits and alternatives.
We discussed the high likelihood of a successful procedure. We
discussed the risks of the procedure including infection, bleeding,
tissue injury and further surgery. We discussed the low dose of
radioactivity involved in the procedure. Informed, written consent
was given.

The usual time-out protocol was performed immediately prior to the
procedure.

Using ultrasound guidance, sterile technique, 1% lidocaine and an
[FU] radioactive seed, left axillary lymph node was localized using
a lateral approach. The follow-up mammogram images confirm the seed
in the expected location and were marked for Dr. TIGER.

Follow-up survey of the patient confirms presence of the radioactive
seed.

Order number of [FU] seed:  [PHONE_NUMBER].

Total activity:  0.259 millicuries reference Date: [DATE]

The patient tolerated the procedure well and was released from the
[REDACTED]. She was given instructions regarding seed removal.
IMPRESSION: Radioactive seed localization left breast. No apparent
complications.

## 2021-03-25 NOTE — H&P (Signed)
PROVIDER:  Georgianne Fick, MD   MRN: [id number] DOB: [birth year]  Subjective    Chief Complaint: Follow-up       History of Present Illness: Diana Weaver is a 46 y.o. female who is seen today for breast cancer follow up.   Pt is a 46 yo F who presented with a new diagnosis of screening detected left breast cancer.  She has a known history of bilateral breasts cysts.  She had a new area of distortion on the left.  Diagnostic imaging was performed.  This showed a spiculated 2 cm mass at 2:30 o'clock 9 cm from the nipple.  There was also a concerning lymph node.  Core needle biopsies were performed. 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  This was in the node and the breast.  She has breast density D.  She denied prior personal or family cancer history prior to this diagnosis other than a mother with melanoma age 64.  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  Also, she has a 30 yo daughter whose birthday is [birth year].     Interval history: Pt underwent MRI and additional biopsies, both of which involved cancer.  These were all in one quadrant, but would involve a very large section of her left breast which would require oncoplastic reconstruction and probable contralateral reduction.  Because of this as well as the many many callbacks and biopsies she has had due to her dense breasts, she would like bilateral mastectomies.  This is very reasonable.     MRI 02/04/21 IMPRESSION: 1. Known malignancy in the LATERAL portion of the LEFT breast measures 3.0 centimeters on MRI. 2. Single enlarged LEFT axillary lymph node represents the recently biopsied node with metastatic carcinoma. 3. Two additional suspicious masses in the UPPER central LEFT breast warranting tissue  diagnosis. Given the very heterogeneous appearance of the LEFT breast, with numerous cysts, MR guided core biopsy is indicated for optimal targeting. 4. RIGHT breast is negative.   RECOMMENDATION: Recommend MR guided core biopsy of 2 adjacent masses in the UPPER central LEFT breast.   Treatment plan for known LEFT breast malignancy.   Pathology additional left biopsies 02/13/21 1. Breast, left, needle core biopsy, upper outer quadrant(anterior lesion) - FOCAL INTRAVASCULAR TUMOR. - DUCTAL PAPILLOMA. - COLUMNAR CELL CHANGE WITH CALCIFICATIONS. - PSEUDOANGIOMATOUS STROMAL HYPERPLASIA ([place]). - SEE MICROSCOPIC DESCRIPTION. 2. Breast, left, needle core biopsy, upper outer quadrant(posterior lesion) - INVASIVE AND IN SITU DUCTAL CARCINOMA, GRADE 2. - SEE MICROSCOPIC DESCRIPTION. Microscopic Comment 1. There is a microscopic focus of intralymphatic tumor. Otherwise there is a ductal papilloma, columnar cell change with calcifications and pseudoangiomatous stromal hyperplasia.   Review of Systems: A complete review of systems was obtained from the patient.  I have reviewed this information and discussed as appropriate with the patient.  See HPI as well for other ROS.   Review of Systems  All other systems reviewed and are negative.       Medical History: Past Medical History      Past Medical History:  Diagnosis Date   Anxiety     Arrhythmia     History of cancer             Patient Active Problem List  Diagnosis   Malignant neoplasm  of upper-outer quadrant of left breast in female, estrogen receptor positive (CMS-HCC)   Bilateral breast cysts   Dense breasts      Past Surgical History       Past Surgical History:  Procedure Laterality Date   Tracheal Resection   02/12/2016        Allergies       Allergies  Allergen Reactions   Latex Hives      all     Povidone-Iodine Anaphylaxis              Current Outpatient Medications on File Prior to Visit   Medication Sig Dispense Refill   metFORMIN (GLUCOPHAGE) 500 MG tablet          No current facility-administered medications on file prior to visit.      Family History       Family History  Problem Relation Age of Onset   Skin cancer Mother     High blood pressure (Hypertension) Father     Hyperlipidemia (Elevated cholesterol) Father     Obesity Sister     High blood pressure (Hypertension) Sister     Hyperlipidemia (Elevated cholesterol) Sister     Diabetes Sister     Stroke Maternal Grandfather          Social History        Tobacco Use  Smoking Status Former   Types: Cigarettes   Quit date: 2011   Years since quitting: 11.9  Smokeless Tobacco Never      Social History  Social History         Socioeconomic History   Marital status: Married  Tobacco Use   Smoking status: Former      Types: Cigarettes      Quit date: 2011      Years since quitting: 11.9   Smokeless tobacco: Never  Substance and Sexual Activity   Alcohol use: Yes      Comment: 1-2 drinks per year   Drug use: Never        Objective:         Vitals:      Pulse: 96  Temp: 36.8 C (98.2 F)  SpO2: 96%  Weight: 90.3 kg (199 lb)  Height: 162.6 cm (5\' 4" )    Body mass index is 34.16 kg/m.   Head:   Normocephalic and atraumatic.  Eyes:    Conjunctivae are normal. Pupils are equal, round, and reactive to light. No scleral icterus.  Neck:   Normal range of motion. Neck supple. No tracheal deviation present. No thyromegaly present.  Resp:   No respiratory distress, normal effort. Breast: palpable mass on upper central left breast.  Small faint hematoma UOQ left breast.   Abd:      Abdomen is soft, non distended and non tender. No masses are palpable.  There is no rebound and no guarding.  Neurological: Alert and oriented to person, place, and time. Coordination normal.  Skin:    Skin is warm and dry. No rash noted. No diaphoretic. No erythema. No pallor.  Psychiatric: Normal mood and  affect. Normal behavior. Judgment and thought content normal.      Labs, Imaging and Diagnostic Testing: n/a     Assessment and Plan:  Diagnoses and all orders for this visit:   Malignant neoplasm of upper-outer quadrant of left breast in female, estrogen receptor positive (CMS-HCC) -     Ambulatory referral to Plastic Surgery   Dense breasts Assessment & Plan: See above.  Patient has 3 sites of cancer in the upper left breast.  Her original biopsy was much more central than the other 2.  This would require a significant resection to get it all out and she would require a contralateral reduction for symmetry.  Given her young age and dense breast, it is very reasonable to do bilateral mastectomies.  If not, she would still require bilateral surgery and would require annual MRI for screening.   I will refer her to plastic surgery for discussion of reconstruction.  She is going to start tamoxifen while she is waiting for surgery.  She would also like to have bilateral salpingo-oophorectomies for ovarian suppression.  If she did not do that she would require ovarian suppression with gosrelin.   I discussed risks of surgery.  I reviewed the risk of bleeding, infection, loss of reconstruction, prolonged healing, decreased range of motion, numbness or burning pain, dissatisfaction with reconstruction, heart or lung issues, and more.  I discussed risk of lymphedema in the breast and arm.  I reviewed that there is a chance of return to the operating room for bleeding with mastectomy.   I will send her to see plastics this week.  She would like to proceed as soon as possible.   No follow-ups on file.   Georgianne Fick, MD

## 2021-03-26 ENCOUNTER — Other Ambulatory Visit: Payer: Federal, State, Local not specified - PPO

## 2021-03-26 NOTE — H&P (Addendum)
Diana Weaver is an 46 y.o. female. She has ER+/PR+ left breast cancer. She is admitted for bilateral mastectomies with Dr Barry Dienes. She requests BSO as well.   Pertinent Gynecological History: Menses: flow is moderate Bleeding:  Contraception: condoms DES exposure: denies Blood transfusions: none Sexually transmitted diseases: no past history Previous GYN Procedures:  Last mammogram: abnormal:    Date: 2022 Last pap: normal Date: 2020 OB History: G2, P1   Menstrual History: Menarche age:  Patient's last menstrual period was 03/20/2021.    Past Medical History:  Diagnosis Date   Allergy    Betadine, latex, and pistacios   Breast cancer ([place]) Diagnosed 01/22/2021   Family history of melanoma 02/05/2021   History of kidney stones    Pre-diabetes     Past Surgical History:  Procedure Laterality Date   BREAST BIOPSY Left 02/13/2021   epiglottic stenosis      Family History  Problem Relation Age of Onset   Hypertension Mother    Hyperlipidemia Mother    Melanoma Mother 38       forehead; surgery only   Depression Father    Hyperlipidemia Father    Hypertension Father    Alcohol abuse Father    Early death Father    Asthma Sister    Depression Sister    Diabetes Sister    Kidney disease Sister    Obesity Sister    Alcohol abuse Maternal Grandfather    Stroke Maternal Grandfather    Cancer Maternal Grandfather        oral cancer; d. early 3s   Hodgkin's lymphoma Paternal Grandfather        d. <60?   Throat cancer Cousin        maternal female cousin; dx 70s    Social History:  reports that she quit smoking about 12 years ago. Her smoking use included cigarettes. She has a 2.50 pack-year smoking history. She has never used smokeless tobacco. She reports current alcohol use. She reports that she does not use drugs.  Allergies:  Allergies  Allergen Reactions   Betadine [Povidone-Iodine] Anaphylaxis    Specifically betadine scrub   Latex Rash   Pistachio  Nut (Diagnostic) Hives    No medications prior to admission.    Review of Systems  Constitutional:  Negative for fever.   Last menstrual period 03/20/2021. Physical Exam Cardiovascular:     Rate and Rhythm: Normal rate.  Pulmonary:     Effort: Pulmonary effort is normal.   Pelvic U/S in office 03/11/21 notes -fibroids 3.3 to 1.6 cm                                                     -Lt ovary 2.1 cm resolving cyst                                                     -Rt ovary not seen  No results found for this or any previous visit (from the past 24 hour(s)).  [place] LT RADIOACTIVE SEED LOC  Result Date: 03/25/2021 CLINICAL DATA:  Patient for preoperative localization of left axillary lymph node. EXAM: ULTRASOUND GUIDED RADIOACTIVE SEED LOCALIZATION OF THE LEFT BREAST COMPARISON:  Previous exam(s). FINDINGS: Patient presents for radioactive seed localization prior to targeted left node resection. I met with the patient and we discussed the procedure of seed localization including benefits and alternatives. We discussed the high likelihood of a successful procedure. We discussed the risks of the procedure including infection, bleeding, tissue injury and further surgery. We discussed the low dose of radioactivity involved in the procedure. Informed, written consent was given. The usual time-out protocol was performed immediately prior to the procedure. Using ultrasound guidance, sterile technique, 1% lidocaine and an I-125 radioactive seed, left axillary lymph node was localized using a lateral approach. The follow-up mammogram images confirm the seed in the expected location and were marked for Dr. Barry Dienes. Follow-up survey of the patient confirms presence of the radioactive seed. Order number of I-125 seed:  [id number]. Total activity:  5.750 millicuries reference Date: 01/06/2021 The patient tolerated the procedure well and was released from the  [facility]. She was given instructions regarding seed removal. IMPRESSION: Radioactive seed localization left breast. No apparent complications. Electronically Signed   By: Lovey Newcomer M.D.   On: 03/25/2021 09:23  MM CLIP PLACEMENT LEFT  Result Date: 03/25/2021 CLINICAL DATA:  Status post seed placement left axillary lymph node EXAM: DIAGNOSTIC LEFT MAMMOGRAM POST ULTRASOUND-GUIDED RADIOACTIVE SEED PLACEMENT COMPARISON:  Previous exam(s). FINDINGS: Mammographic images were obtained following ultrasound-guided radioactive seed placement. These demonstrate the radioactive seed to be located within the left axillary lymph node adjacent to the biopsy marking clip. IMPRESSION: Appropriate location of the radioactive seed. Final Assessment: Post Procedure Mammograms for Seed Placement Electronically Signed   By: Lovey Newcomer M.D.   On: 03/25/2021 09:24   Assessment/Plan: 46 yo for L/S BSO  D/W procedure and risks including infection, organ damage, bleeding/transfusion-HIV/Hep, DVT/PE, pneumonia, laparotomy.  Patient states she understands and agrees. Bilateral mastectomy with Dr Barry Dienes to follow.  Shon Millet II 03/26/2021, 6:54 PM

## 2021-03-26 NOTE — Anesthesia Preprocedure Evaluation (Addendum)
Anesthesia Evaluation  Patient identified by MRN, date of birth, ID band Patient awake    Reviewed: Allergy & Precautions, NPO status , Patient's Chart, lab work & pertinent test results  Airway Mallampati: II  TM Distance: >3 FB Neck ROM: Full    Dental no notable dental hx. (+) Teeth Intact, Dental Advisory Given   Pulmonary former smoker,    Pulmonary exam normal        Cardiovascular negative cardio ROS Normal cardiovascular exam     Neuro/Psych negative psych ROS   GI/Hepatic negative GI ROS, Neg liver ROS,   Endo/Other  negative endocrine ROS  Renal/GU negative Renal ROS  negative genitourinary   Musculoskeletal negative musculoskeletal ROS (+)   Abdominal (+) + obese,   Peds  Hematology negative hematology ROS (+)   Anesthesia Other Findings   Reproductive/Obstetrics negative OB ROS                           Anesthesia Physical Anesthesia Plan  ASA: 2  Anesthesia Plan: General   Post-op Pain Management: Regional block and Dilaudid IV   Induction: Intravenous  PONV Risk Score and Plan: Ondansetron, Dexamethasone and Midazolam  Airway Management Planned: Oral ETT  Additional Equipment: None  Intra-op Plan:   Post-operative Plan: Extubation in OR  Informed Consent: I have reviewed the patients History and Physical, chart, labs and discussed the procedure including the risks, benefits and alternatives for the proposed anesthesia with the patient or authorized representative who has indicated his/her understanding and acceptance.     Dental advisory given  Plan Discussed with: CRNA  Anesthesia Plan Comments:        Anesthesia Quick Evaluation

## 2021-03-27 ENCOUNTER — Ambulatory Visit (HOSPITAL_COMMUNITY): Payer: BC Managed Care – PPO | Admitting: Anesthesiology

## 2021-03-27 ENCOUNTER — Encounter (HOSPITAL_COMMUNITY)
Admission: AD | Disposition: A | Discharge status: Home / Self Care | Payer: Self-pay | Source: Ambulatory Visit | Attending: General Surgery

## 2021-03-27 ENCOUNTER — Ambulatory Visit
Admission: RE | Admit: 2021-03-27 | Discharge: 2021-03-27 | Disposition: A | Discharge status: Home / Self Care | Payer: Federal, State, Local not specified - PPO | Source: Ambulatory Visit | Attending: General Surgery | Admitting: General Surgery

## 2021-03-27 ENCOUNTER — Encounter (HOSPITAL_COMMUNITY): Payer: Self-pay | Admitting: General Surgery

## 2021-03-27 ENCOUNTER — Other Ambulatory Visit: Payer: Self-pay

## 2021-03-27 ENCOUNTER — Encounter (HOSPITAL_COMMUNITY): Payer: Self-pay

## 2021-03-27 ENCOUNTER — Inpatient Hospital Stay (HOSPITAL_COMMUNITY)
Admission: AD | Admit: 2021-03-27 | Discharge: 2021-03-29 | DRG: 580 | Disposition: A | Discharge status: Home / Self Care | Payer: BC Managed Care – PPO | Source: Ambulatory Visit | Attending: General Surgery | Admitting: General Surgery

## 2021-03-27 ENCOUNTER — Ambulatory Visit (HOSPITAL_COMMUNITY)
Admission: RE | Admit: 2021-03-27 | Discharge: 2021-03-27 | Disposition: A | Discharge status: Home / Self Care | Payer: BC Managed Care – PPO | Source: Ambulatory Visit | Attending: General Surgery | Admitting: General Surgery

## 2021-03-27 DIAGNOSIS — Z20822 Contact with and (suspected) exposure to covid-19: Secondary | ICD-10-CM | POA: Diagnosis present

## 2021-03-27 DIAGNOSIS — N809 Endometriosis, unspecified: Secondary | ICD-10-CM | POA: Diagnosis present

## 2021-03-27 DIAGNOSIS — C773 Secondary and unspecified malignant neoplasm of axilla and upper limb lymph nodes: Secondary | ICD-10-CM | POA: Diagnosis present

## 2021-03-27 DIAGNOSIS — R928 Other abnormal and inconclusive findings on diagnostic imaging of breast: Secondary | ICD-10-CM | POA: Diagnosis not present

## 2021-03-27 DIAGNOSIS — Z87891 Personal history of nicotine dependence: Secondary | ICD-10-CM

## 2021-03-27 DIAGNOSIS — Z888 Allergy status to other drugs, medicaments and biological substances status: Secondary | ICD-10-CM

## 2021-03-27 DIAGNOSIS — N6011 Diffuse cystic mastopathy of right breast: Secondary | ICD-10-CM | POA: Diagnosis not present

## 2021-03-27 DIAGNOSIS — N83202 Unspecified ovarian cyst, left side: Secondary | ICD-10-CM | POA: Diagnosis present

## 2021-03-27 DIAGNOSIS — Z170 Estrogen receptor positive status [ER+]: Secondary | ICD-10-CM | POA: Diagnosis not present

## 2021-03-27 DIAGNOSIS — Z91040 Latex allergy status: Secondary | ICD-10-CM | POA: Diagnosis not present

## 2021-03-27 DIAGNOSIS — C50412 Malignant neoplasm of upper-outer quadrant of left female breast: Secondary | ICD-10-CM

## 2021-03-27 DIAGNOSIS — Z4002 Encounter for prophylactic removal of ovary: Secondary | ICD-10-CM | POA: Diagnosis not present

## 2021-03-27 DIAGNOSIS — Z808 Family history of malignant neoplasm of other organs or systems: Secondary | ICD-10-CM | POA: Diagnosis not present

## 2021-03-27 DIAGNOSIS — N6001 Solitary cyst of right breast: Secondary | ICD-10-CM | POA: Diagnosis present

## 2021-03-27 DIAGNOSIS — N6002 Solitary cyst of left breast: Secondary | ICD-10-CM | POA: Diagnosis present

## 2021-03-27 DIAGNOSIS — C50112 Malignant neoplasm of central portion of left female breast: Secondary | ICD-10-CM

## 2021-03-27 DIAGNOSIS — C50912 Malignant neoplasm of unspecified site of left female breast: Secondary | ICD-10-CM | POA: Diagnosis present

## 2021-03-27 DIAGNOSIS — R921 Mammographic calcification found on diagnostic imaging of breast: Secondary | ICD-10-CM | POA: Diagnosis not present

## 2021-03-27 DIAGNOSIS — Z1502 Genetic susceptibility to malignant neoplasm of ovary: Secondary | ICD-10-CM | POA: Diagnosis not present

## 2021-03-27 DIAGNOSIS — D241 Benign neoplasm of right breast: Secondary | ICD-10-CM | POA: Diagnosis not present

## 2021-03-27 DIAGNOSIS — Z7984 Long term (current) use of oral hypoglycemic drugs: Secondary | ICD-10-CM | POA: Diagnosis not present

## 2021-03-27 HISTORY — PX: LAPAROTOMY: SHX154

## 2021-03-27 HISTORY — PX: MASTECTOMY W/ SENTINEL NODE BIOPSY: SHX2001

## 2021-03-27 HISTORY — PX: LAPAROSCOPY: SHX197

## 2021-03-27 HISTORY — PX: RADIOACTIVE SEED GUIDED AXILLARY SENTINEL LYMPH NODE: SHX6735

## 2021-03-27 HISTORY — PX: TOTAL MASTECTOMY: SHX6129

## 2021-03-27 HISTORY — PX: SALPINGOOPHORECTOMY: SHX82

## 2021-03-27 LAB — GLUCOSE, CAPILLARY
Glucose-Capillary: 141 mg/dL — ABNORMAL HIGH (ref 70–99)
Glucose-Capillary: 250 mg/dL — ABNORMAL HIGH (ref 70–99)

## 2021-03-27 LAB — TYPE AND SCREEN
ABO/RH(D): A POS
ABO/RH(D): A POS
Antibody Screen: NEGATIVE
Antibody Screen: NEGATIVE

## 2021-03-27 IMAGING — MG MM BREAST SURGICAL SPECIMEN
1 series · 1 of 1 positions shown · non-contrast
Comparison: Previous exam(s).

CLINICAL DATA: Status post seed localized LEFT axillary excision.

EXAM:
SPECIMEN RADIOGRAPH OF THE LEFT AXILLA

[L]
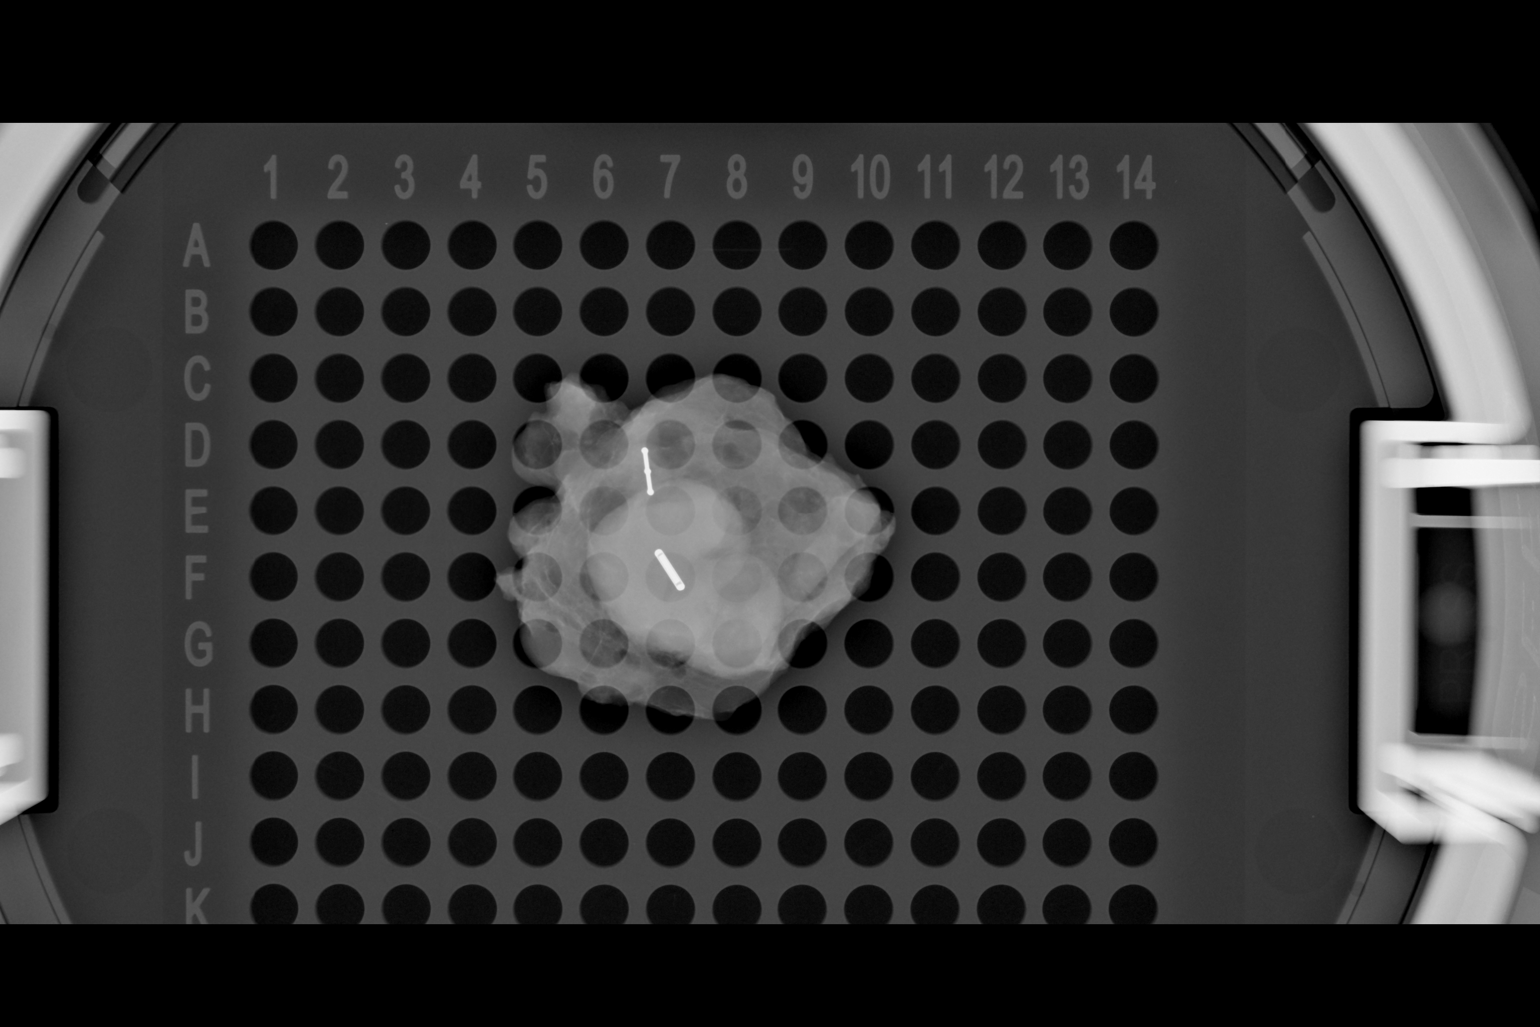

[1 of 1 positions shown; findings below may reference images not displayed]

FINDINGS: Status post excision of the left axilla. The radioactive seed and
SORIMACHI biopsy marker clip are present, completely intact, and were
marked for pathology.
IMPRESSION: Specimen radiograph of the left breast.

## 2021-03-27 SURGERY — SALPINGO-OOPHORECTOMY, LAPAROSCOPIC
Anesthesia: Choice | Laterality: Right

## 2021-03-27 SURGERY — MASTECTOMY WITH SENTINEL LYMPH NODE BIOPSY
Anesthesia: General | Site: Breast | Laterality: Right

## 2021-03-27 MED ORDER — PROPOFOL 500 MG/50ML IV EMUL
INTRAVENOUS | Status: DC | PRN
Start: 2021-03-27 — End: 2021-03-27
  Administered 2021-03-27: 20 ug/kg/min via INTRAVENOUS

## 2021-03-27 MED ORDER — PROCHLORPERAZINE MALEATE 10 MG PO TABS
10.0000 mg | ORAL_TABLET | Freq: Four times a day (QID) | ORAL | Status: DC | PRN
  Filled 2021-03-27: qty 1

## 2021-03-27 MED ORDER — PHENYLEPHRINE HCL-NACL 20-0.9 MG/250ML-% IV SOLN
INTRAVENOUS | Status: DC | PRN
  Administered 2021-03-27: 30 ug/min via INTRAVENOUS

## 2021-03-27 MED ORDER — ACETAMINOPHEN 500 MG PO TABS
1000.0000 mg | ORAL_TABLET | ORAL | Status: AC
  Administered 2021-03-27: 1000 mg via ORAL
  Filled 2021-03-27: qty 2

## 2021-03-27 MED ORDER — HYDROMORPHONE HCL 1 MG/ML IJ SOLN
0.2500 mg | INTRAMUSCULAR | Status: DC | PRN
  Administered 2021-03-27 (×2): 0.5 mg via INTRAVENOUS

## 2021-03-27 MED ORDER — FENTANYL CITRATE (PF) 250 MCG/5ML IJ SOLN
INTRAMUSCULAR | Status: AC
  Filled 2021-03-27: qty 5

## 2021-03-27 MED ORDER — ONDANSETRON HCL 4 MG/2ML IJ SOLN
4.0000 mg | Freq: Four times a day (QID) | INTRAMUSCULAR | Status: DC | PRN
  Administered 2021-03-27: 4 mg via INTRAVENOUS
  Filled 2021-03-27: qty 2

## 2021-03-27 MED ORDER — ONDANSETRON HCL 4 MG/2ML IJ SOLN
INTRAMUSCULAR | Status: AC
  Filled 2021-03-27: qty 2

## 2021-03-27 MED ORDER — DIPHENHYDRAMINE HCL 50 MG/ML IJ SOLN: 12.5000 mg | Freq: Four times a day (QID) | INTRAMUSCULAR | Status: DC | PRN

## 2021-03-27 MED ORDER — CEFAZOLIN SODIUM-DEXTROSE 2-4 GM/100ML-% IV SOLN: 2.0000 g | INTRAVENOUS | Status: DC

## 2021-03-27 MED ORDER — METHOCARBAMOL 500 MG PO TABS
500.0000 mg | ORAL_TABLET | Freq: Four times a day (QID) | ORAL | Status: DC | PRN
  Administered 2021-03-27 – 2021-03-28 (×3): 500 mg via ORAL
  Filled 2021-03-27 (×3): qty 1

## 2021-03-27 MED ORDER — KETAMINE HCL 50 MG/ML IJ SOLN
INTRAMUSCULAR | Status: DC | PRN
  Administered 2021-03-27: 40 mg via INTRAMUSCULAR
  Administered 2021-03-27: 10 mg via INTRAMUSCULAR

## 2021-03-27 MED ORDER — DIPHENHYDRAMINE HCL 12.5 MG/5ML PO ELIX: 12.5000 mg | ORAL_SOLUTION | Freq: Four times a day (QID) | ORAL | Status: DC | PRN

## 2021-03-27 MED ORDER — MELATONIN 3 MG PO TABS
3.0000 mg | ORAL_TABLET | Freq: Every evening | ORAL | Status: DC | PRN
  Administered 2021-03-28: 3 mg via ORAL
  Filled 2021-03-27 (×3): qty 1

## 2021-03-27 MED ORDER — CEFAZOLIN SODIUM-DEXTROSE 2-4 GM/100ML-% IV SOLN
2.0000 g | INTRAVENOUS | Status: AC
  Administered 2021-03-27 (×2): 2 g via INTRAVENOUS
  Filled 2021-03-27: qty 100

## 2021-03-27 MED ORDER — SENNA 8.6 MG PO TABS
1.0000 | ORAL_TABLET | Freq: Two times a day (BID) | ORAL | Status: DC
  Administered 2021-03-28 – 2021-03-29 (×3): 8.6 mg via ORAL
  Filled 2021-03-27 (×3): qty 1

## 2021-03-27 MED ORDER — CHLORHEXIDINE GLUCONATE 0.12 % MT SOLN
OROMUCOSAL | Status: AC
  Administered 2021-03-27: 15 mL
  Filled 2021-03-27: qty 15

## 2021-03-27 MED ORDER — MORPHINE SULFATE (PF) 2 MG/ML IV SOLN
1.0000 mg | INTRAVENOUS | Status: DC | PRN
  Administered 2021-03-27 – 2021-03-28 (×5): 2 mg via INTRAVENOUS
  Filled 2021-03-27 (×5): qty 1

## 2021-03-27 MED ORDER — ROCURONIUM BROMIDE 10 MG/ML (PF) SYRINGE
PREFILLED_SYRINGE | INTRAVENOUS | Status: AC
  Filled 2021-03-27: qty 10

## 2021-03-27 MED ORDER — PROPOFOL 10 MG/ML IV BOLUS
INTRAVENOUS | Status: AC
  Filled 2021-03-27: qty 20

## 2021-03-27 MED ORDER — KETOROLAC TROMETHAMINE 30 MG/ML IJ SOLN: 30.0000 mg | Freq: Once | INTRAMUSCULAR | Status: DC | PRN

## 2021-03-27 MED ORDER — SOD CITRATE-CITRIC ACID 500-334 MG/5ML PO SOLN
30.0000 mL | ORAL | Status: AC
  Administered 2021-03-27: 30 mL via ORAL
  Filled 2021-03-27: qty 30

## 2021-03-27 MED ORDER — CLONIDINE HCL (ANALGESIA) 100 MCG/ML EP SOLN
EPIDURAL | Status: DC | PRN
Start: 2021-03-27 — End: 2021-03-27
  Administered 2021-03-27 (×2): 100 ug

## 2021-03-27 MED ORDER — TAMOXIFEN CITRATE 10 MG PO TABS
20.0000 mg | ORAL_TABLET | Freq: Every day | ORAL | Status: DC
  Administered 2021-03-28 – 2021-03-29 (×2): 20 mg via ORAL
  Filled 2021-03-27 (×2): qty 2

## 2021-03-27 MED ORDER — PHENYLEPHRINE 40 MCG/ML (10ML) SYRINGE FOR IV PUSH (FOR BLOOD PRESSURE SUPPORT)
PREFILLED_SYRINGE | INTRAVENOUS | Status: DC | PRN
  Administered 2021-03-27: 120 ug via INTRAVENOUS

## 2021-03-27 MED ORDER — DEXAMETHASONE SODIUM PHOSPHATE 10 MG/ML IJ SOLN
INTRAMUSCULAR | Status: DC | PRN
Start: 2021-03-27 — End: 2021-03-27
  Administered 2021-03-27 (×2): 10 mg

## 2021-03-27 MED ORDER — 0.9 % SODIUM CHLORIDE (POUR BTL) OPTIME
TOPICAL | Status: DC | PRN
  Administered 2021-03-27: 1000 mL

## 2021-03-27 MED ORDER — HYDROMORPHONE HCL 1 MG/ML IJ SOLN
INTRAMUSCULAR | Status: AC
  Filled 2021-03-27: qty 1

## 2021-03-27 MED ORDER — KETAMINE HCL 50 MG/5ML IJ SOSY
PREFILLED_SYRINGE | INTRAMUSCULAR | Status: AC
  Filled 2021-03-27: qty 5

## 2021-03-27 MED ORDER — PROCHLORPERAZINE EDISYLATE 10 MG/2ML IJ SOLN: 5.0000 mg | Freq: Four times a day (QID) | INTRAMUSCULAR | Status: DC | PRN

## 2021-03-27 MED ORDER — ONDANSETRON HCL 4 MG/2ML IJ SOLN
INTRAMUSCULAR | Status: DC | PRN
Start: 2021-03-27 — End: 2021-03-27
  Administered 2021-03-27 (×2): 4 mg via INTRAVENOUS

## 2021-03-27 MED ORDER — GABAPENTIN 300 MG PO CAPS
300.0000 mg | ORAL_CAPSULE | Freq: Two times a day (BID) | ORAL | Status: DC
  Administered 2021-03-27 – 2021-03-29 (×4): 300 mg via ORAL
  Filled 2021-03-27 (×4): qty 1

## 2021-03-27 MED ORDER — TRAMADOL HCL 50 MG PO TABS
50.0000 mg | ORAL_TABLET | Freq: Four times a day (QID) | ORAL | Status: DC | PRN
  Administered 2021-03-27: 50 mg via ORAL
  Filled 2021-03-27: qty 1

## 2021-03-27 MED ORDER — BUPIVACAINE HCL (PF) 0.5 % IJ SOLN
INTRAMUSCULAR | Status: AC
  Filled 2021-03-27: qty 30

## 2021-03-27 MED ORDER — PROPOFOL 10 MG/ML IV BOLUS
INTRAVENOUS | Status: DC | PRN
Start: 2021-03-27 — End: 2021-03-27
  Administered 2021-03-27: 200 mg via INTRAVENOUS

## 2021-03-27 MED ORDER — MIDAZOLAM HCL 2 MG/2ML IJ SOLN
INTRAMUSCULAR | Status: AC
  Filled 2021-03-27: qty 2

## 2021-03-27 MED ORDER — OXYCODONE HCL 5 MG PO TABS
5.0000 mg | ORAL_TABLET | ORAL | Status: DC | PRN
  Administered 2021-03-27: 10 mg via ORAL
  Filled 2021-03-27: qty 2

## 2021-03-27 MED ORDER — MIDAZOLAM HCL 2 MG/2ML IJ SOLN
INTRAMUSCULAR | Status: DC | PRN
Start: 2021-03-27 — End: 2021-03-27
  Administered 2021-03-27 (×2): 1 mg via INTRAVENOUS

## 2021-03-27 MED ORDER — ROPIVACAINE HCL 5 MG/ML IJ SOLN
INTRAMUSCULAR | Status: DC | PRN
  Administered 2021-03-27 (×12): 5 mL via PERINEURAL

## 2021-03-27 MED ORDER — SODIUM CHLORIDE 0.9 % IR SOLN
Status: DC | PRN
  Administered 2021-03-27: 1000 mL

## 2021-03-27 MED ORDER — MEPERIDINE HCL 25 MG/ML IJ SOLN: 6.2500 mg | INTRAMUSCULAR | Status: DC | PRN

## 2021-03-27 MED ORDER — CHLORHEXIDINE GLUCONATE CLOTH 2 % EX PADS: 6.0000 | MEDICATED_PAD | Freq: Once | CUTANEOUS | Status: DC

## 2021-03-27 MED ORDER — LIDOCAINE 2% (20 MG/ML) 5 ML SYRINGE
INTRAMUSCULAR | Status: AC
  Filled 2021-03-27: qty 5

## 2021-03-27 MED ORDER — CEFAZOLIN SODIUM-DEXTROSE 2-4 GM/100ML-% IV SOLN
2.0000 g | Freq: Three times a day (TID) | INTRAVENOUS | Status: AC
  Filled 2021-03-27: qty 100

## 2021-03-27 MED ORDER — PROMETHAZINE HCL 25 MG/ML IJ SOLN: 6.2500 mg | INTRAMUSCULAR | Status: DC | PRN

## 2021-03-27 MED ORDER — CEFAZOLIN SODIUM 1 G IJ SOLR
INTRAMUSCULAR | Status: AC
  Filled 2021-03-27: qty 20

## 2021-03-27 MED ORDER — ENSURE PRE-SURGERY PO LIQD: 296.0000 mL | Freq: Once | ORAL | Status: DC

## 2021-03-27 MED ORDER — OXYCODONE HCL 5 MG PO TABS: 15.0000 mg | ORAL_TABLET | Freq: Four times a day (QID) | ORAL | Status: DC | PRN

## 2021-03-27 MED ORDER — ONDANSETRON 4 MG PO TBDP: 4.0000 mg | ORAL_TABLET | Freq: Four times a day (QID) | ORAL | Status: DC | PRN

## 2021-03-27 MED ORDER — MAGTRACE LYMPHATIC TRACER
INTRAMUSCULAR | Status: DC | PRN
  Administered 2021-03-27: 2 mL via INTRAMUSCULAR

## 2021-03-27 MED ORDER — ROCURONIUM BROMIDE 10 MG/ML (PF) SYRINGE
PREFILLED_SYRINGE | INTRAVENOUS | Status: DC | PRN
  Administered 2021-03-27: 20 mg via INTRAVENOUS
  Administered 2021-03-27: 70 mg via INTRAVENOUS
  Administered 2021-03-27: 30 mg via INTRAVENOUS
  Administered 2021-03-27 (×2): 40 mg via INTRAVENOUS

## 2021-03-27 MED ORDER — METFORMIN HCL 500 MG PO TABS
500.0000 mg | ORAL_TABLET | Freq: Two times a day (BID) | ORAL | Status: DC
  Administered 2021-03-27 – 2021-03-29 (×4): 500 mg via ORAL
  Filled 2021-03-27 (×4): qty 1

## 2021-03-27 MED ORDER — DEXAMETHASONE SODIUM PHOSPHATE 10 MG/ML IJ SOLN
INTRAMUSCULAR | Status: DC | PRN
  Administered 2021-03-27: 10 mg via INTRAVENOUS

## 2021-03-27 MED ORDER — BUPIVACAINE HCL (PF) 0.5 % IJ SOLN
INTRAMUSCULAR | Status: DC | PRN
  Administered 2021-03-27: 10 mL

## 2021-03-27 MED ORDER — FENTANYL CITRATE (PF) 250 MCG/5ML IJ SOLN
INTRAMUSCULAR | Status: DC | PRN
  Administered 2021-03-27 (×2): 50 ug via INTRAVENOUS
  Administered 2021-03-27: 100 ug via INTRAVENOUS
  Administered 2021-03-27 (×3): 50 ug via INTRAVENOUS

## 2021-03-27 MED ORDER — PHENYLEPHRINE HCL-NACL 20-0.9 MG/250ML-% IV SOLN
INTRAVENOUS | Status: AC
  Filled 2021-03-27: qty 500

## 2021-03-27 MED ORDER — INSULIN ASPART 100 UNIT/ML IJ SOLN
6.0000 [IU] | Freq: Once | INTRAMUSCULAR | Status: AC
  Administered 2021-03-27: 6 [IU] via SUBCUTANEOUS

## 2021-03-27 MED ORDER — DEXAMETHASONE SODIUM PHOSPHATE 10 MG/ML IJ SOLN
INTRAMUSCULAR | Status: AC
  Filled 2021-03-27: qty 1

## 2021-03-27 MED ORDER — SUGAMMADEX SODIUM 200 MG/2ML IV SOLN
INTRAVENOUS | Status: DC | PRN
Start: 2021-03-27 — End: 2021-03-27
  Administered 2021-03-27: 200 mg via INTRAVENOUS

## 2021-03-27 MED ORDER — INSULIN ASPART 100 UNIT/ML IJ SOLN
INTRAMUSCULAR | Status: AC
  Filled 2021-03-27: qty 1

## 2021-03-27 MED ORDER — LACTATED RINGERS IV SOLN
INTRAVENOUS | Status: DC | PRN
Start: 2021-03-27 — End: 2021-03-27

## 2021-03-27 SURGICAL SUPPLY — 116 items
BAG COUNTER SPONGE SURGICOUNT (BAG) ×13 IMPLANT
BARRIER ADHS 3X4 INTERCEED (GAUZE/BANDAGES/DRESSINGS) IMPLANT
BENZOIN TINCTURE PRP APPL 2/3 (GAUZE/BANDAGES/DRESSINGS) ×2 IMPLANT
BINDER BREAST 3XL (GAUZE/BANDAGES/DRESSINGS) ×2 IMPLANT
BINDER BREAST LRG (GAUZE/BANDAGES/DRESSINGS) IMPLANT
BINDER BREAST XLRG (GAUZE/BANDAGES/DRESSINGS) IMPLANT
BINDER BREAST XXLRG (GAUZE/BANDAGES/DRESSINGS) ×2 IMPLANT
BIOPATCH RED 1 DISK 7.0 (GAUZE/BANDAGES/DRESSINGS) ×9 IMPLANT
BNDG COHESIVE 4X5 TAN STRL (GAUZE/BANDAGES/DRESSINGS) ×5 IMPLANT
CABLE HIGH FREQUENCY MONO STRZ (ELECTRODE) IMPLANT
CANISTER SUCT 3000ML PPV (MISCELLANEOUS) ×14 IMPLANT
CATH FOLEY LATEX FREE 16FR (CATHETERS) ×1
CATH FOLEY LF 16FR (CATHETERS) ×1 IMPLANT
CATH ROBINSON RED A/P 16FR (CATHETERS) ×7 IMPLANT
CHLORAPREP W/TINT 26 (MISCELLANEOUS) ×7 IMPLANT
CLIP VESOCCLUDE LG 6/CT (CLIP) ×7 IMPLANT
CLIP VESOCCLUDE MED 24/CT (CLIP) ×7 IMPLANT
CLIP VESOCCLUDE MED 6/CT (CLIP) ×7 IMPLANT
CLIP VESOCCLUDE SM WIDE 6/CT (CLIP) ×7 IMPLANT
CLSR STERI-STRIP ANTIMIC 1/2X4 (GAUZE/BANDAGES/DRESSINGS) ×2 IMPLANT
CNTNR URN SCR LID CUP LEK RST (MISCELLANEOUS) ×6 IMPLANT
CONT SPEC 4OZ STRL OR WHT (MISCELLANEOUS) ×1
COVER PROBE W GEL 5X96 (DRAPES) ×5 IMPLANT
COVER SURGICAL LIGHT HANDLE (MISCELLANEOUS) ×9 IMPLANT
DERMABOND ADVANCED (GAUZE/BANDAGES/DRESSINGS) ×2
DERMABOND ADVANCED .7 DNX12 (GAUZE/BANDAGES/DRESSINGS) ×12 IMPLANT
DEVICE DUBIN SPECIMEN MAMMOGRA (MISCELLANEOUS) ×2 IMPLANT
DRAIN CHANNEL 19F RND (DRAIN) ×5 IMPLANT
DRAPE CHEST BREAST 15X10 FENES (DRAPES) ×7 IMPLANT
DRAPE SURG 17X23 STRL (DRAPES) IMPLANT
DRAPE UTILITY XL STRL (DRAPES) ×2 IMPLANT
DRSG OPSITE POSTOP 4X8 (GAUZE/BANDAGES/DRESSINGS) ×2 IMPLANT
DRSG PAD ABDOMINAL 8X10 ST (GAUZE/BANDAGES/DRESSINGS) ×11 IMPLANT
DRSG TEGADERM 4X4.75 (GAUZE/BANDAGES/DRESSINGS) ×9 IMPLANT
ELECT BLADE 6.5 EXT (BLADE) ×2 IMPLANT
ELECT CAUTERY BLADE 6.4 (BLADE) ×7 IMPLANT
ELECT COATED BLADE 2.86 ST (ELECTRODE) ×7 IMPLANT
ELECT REM PT RETURN 9FT ADLT (ELECTROSURGICAL) ×7
ELECTRODE REM PT RTRN 9FT ADLT (ELECTROSURGICAL) ×6 IMPLANT
EVACUATOR SILICONE 100CC (DRAIN) ×5 IMPLANT
FILTER STRAW FLUID ASPIR (MISCELLANEOUS) IMPLANT
GAUZE SPONGE 4X4 12PLY STRL (GAUZE/BANDAGES/DRESSINGS) ×9 IMPLANT
GLOVE SRG 8 PF TXTR STRL LF DI (GLOVE) ×6 IMPLANT
GLOVE SURG ENC MOIS LTX SZ6 (GLOVE) ×7 IMPLANT
GLOVE SURG ENC MOIS LTX SZ8 (GLOVE) ×14 IMPLANT
GLOVE SURG UNDER LTX SZ6.5 (GLOVE) ×7 IMPLANT
GLOVE SURG UNDER POLY LF SZ7 (GLOVE) ×7 IMPLANT
GLOVE SURG UNDER POLY LF SZ8 (GLOVE) ×1
GOWN STRL REUS W/ TWL LRG LVL3 (GOWN DISPOSABLE) ×8 IMPLANT
GOWN STRL REUS W/TWL 2XL LVL3 (GOWN DISPOSABLE) ×7 IMPLANT
GOWN STRL REUS W/TWL LRG LVL3 (GOWN DISPOSABLE) ×3
HEMOSTAT ARISTA ABSORB 3G PWDR (HEMOSTASIS) ×2 IMPLANT
ILLUMINATOR WAVEGUIDE N/F (MISCELLANEOUS) ×2 IMPLANT
IRRIG SUCT STRYKERFLOW 2 WTIP (MISCELLANEOUS)
IRRIGATION SUCT STRKRFLW 2 WTP (MISCELLANEOUS) IMPLANT
KIT BASIN OR (CUSTOM PROCEDURE TRAY) ×7 IMPLANT
KIT MARKER MARGIN INK (KITS) ×7 IMPLANT
KIT TURNOVER KIT B (KITS) ×12 IMPLANT
LIGHT WAVEGUIDE WIDE FLAT (MISCELLANEOUS) ×7 IMPLANT
MARKER SKIN DUAL TIP RULER LAB (MISCELLANEOUS) ×7 IMPLANT
NDL 18GX1X1/2 (RX/OR ONLY) (NEEDLE) IMPLANT
NDL FILTER BLUNT 18X1 1/2 (NEEDLE) IMPLANT
NDL HYPO 25GX1X1/2 BEV (NEEDLE) ×5 IMPLANT
NDL INSUFFLATION 14GA 120MM (NEEDLE) ×5 IMPLANT
NDL SPNL 18GX3.5 QUINCKE PK (NEEDLE) IMPLANT
NDL SPNL 22GX3.5 QUINCKE BK (NEEDLE) ×5 IMPLANT
NEEDLE 18GX1X1/2 (RX/OR ONLY) (NEEDLE) IMPLANT
NEEDLE FILTER BLUNT 18X 1/2SAF (NEEDLE)
NEEDLE FILTER BLUNT 18X1 1/2 (NEEDLE) IMPLANT
NEEDLE HYPO 25GX1X1/2 BEV (NEEDLE) ×7 IMPLANT
NEEDLE INSUFFLATION 14GA 120MM (NEEDLE) ×7 IMPLANT
NEEDLE SPNL 18GX3.5 QUINCKE PK (NEEDLE) IMPLANT
NEEDLE SPNL 22GX3.5 QUINCKE BK (NEEDLE) ×7 IMPLANT
NS IRRIG 1000ML POUR BTL (IV SOLUTION) ×14 IMPLANT
PACK GENERAL/GYN (CUSTOM PROCEDURE TRAY) ×7 IMPLANT
PACK LAPAROSCOPY BASIN (CUSTOM PROCEDURE TRAY) ×7 IMPLANT
PACK TRENDGUARD 450 HYBRID PRO (MISCELLANEOUS) ×1 IMPLANT
PACK UNIVERSAL I (CUSTOM PROCEDURE TRAY) ×7 IMPLANT
PAD ABD 8X10 STRL (GAUZE/BANDAGES/DRESSINGS) ×2 IMPLANT
PAD ARMBOARD 7.5X6 YLW CONV (MISCELLANEOUS) ×9 IMPLANT
PENCIL SMOKE EVACUATOR (MISCELLANEOUS) ×5 IMPLANT
POUCH SPECIMEN RETRIEVAL 10MM (ENDOMECHANICALS) IMPLANT
PROTECTOR NERVE ULNAR (MISCELLANEOUS) ×12 IMPLANT
SEALER TISSUE G2 CVD JAW 45CM (ENDOMECHANICALS) ×2 IMPLANT
SET TUBE SMOKE EVAC HIGH FLOW (TUBING) ×7 IMPLANT
SLEEVE ENDOPATH XCEL 5M (ENDOMECHANICALS) IMPLANT
SPECIMEN JAR X LARGE (MISCELLANEOUS) ×9 IMPLANT
STAPLER VISISTAT 35W (STAPLE) ×5 IMPLANT
STOCKINETTE IMPERVIOUS 9X36 MD (GAUZE/BANDAGES/DRESSINGS) ×5 IMPLANT
STRIP CLOSURE SKIN 1/2X4 (GAUZE/BANDAGES/DRESSINGS) ×9 IMPLANT
SUT ETHILON 2 0 FS 18 (SUTURE) ×9 IMPLANT
SUT MNCRL 0 MO-4 VIOLET 18 CR (SUTURE) ×1 IMPLANT
SUT MNCRL AB 4-0 PS2 18 (SUTURE) ×9 IMPLANT
SUT MON AB 4-0 PC3 18 (SUTURE) ×5 IMPLANT
SUT MONOCRYL 0 MO 4 18  CR/8 (SUTURE) ×1
SUT PDS II 0 TP-1 LOOPED 60 (SUTURE) ×4 IMPLANT
SUT SILK 2 0 (SUTURE)
SUT SILK 2 0 PERMA HAND 18 BK (SUTURE) ×5 IMPLANT
SUT SILK 2 0 SH (SUTURE) ×2 IMPLANT
SUT SILK 2 0 TIES 10X30 (SUTURE) ×2 IMPLANT
SUT SILK 2-0 18XBRD TIE 12 (SUTURE) ×5 IMPLANT
SUT VIC AB 3-0 SH 8-18 (SUTURE) ×9 IMPLANT
SUT VIC AB 4-0 KS 27 (SUTURE) ×2 IMPLANT
SUT VICRYL 0 UR6 27IN ABS (SUTURE) ×9 IMPLANT
SUT VICRYL 4-0 PS2 18IN ABS (SUTURE) IMPLANT
SYR 30ML LL (SYRINGE) IMPLANT
SYR 3ML LL SCALE MARK (SYRINGE) ×2 IMPLANT
SYR 50ML LL SCALE MARK (SYRINGE) ×7 IMPLANT
SYR CONTROL 10ML LL (SYRINGE) ×7 IMPLANT
TAPE CLOTH SURG 6X10 WHT LF (GAUZE/BANDAGES/DRESSINGS) ×2 IMPLANT
TOWEL GREEN STERILE (TOWEL DISPOSABLE) ×7 IMPLANT
TOWEL GREEN STERILE FF (TOWEL DISPOSABLE) ×19 IMPLANT
TRENDGUARD 450 HYBRID PRO PACK (MISCELLANEOUS) ×7
TROCAR XCEL NON-BLD 11X100MML (ENDOMECHANICALS) ×7 IMPLANT
TROCAR XCEL NON-BLD 5MMX100MML (ENDOMECHANICALS) ×7 IMPLANT
WARMER LAPAROSCOPE (MISCELLANEOUS) ×7 IMPLANT

## 2021-03-27 NOTE — Anesthesia Postprocedure Evaluation (Signed)
Anesthesia Post Note  Patient: Diana Weaver  Procedure(s) Performed: LEFT MASTECTOMY WITH SENTINEL LYMPH NODE BIOPSY (Left: Breast) RIGHT TOTAL MASTECTOMY (Right: Breast) RADIOACTIVE SEED GUIDED LEFT AXILLARY SENTINEL LYMPH NODE BIOPSY (Left: Axilla) DIAGNOSTIC LAPAROSCOPY  (Abdomen) MINI LAPAROTOMY (Abdomen) OPEN BILATERAL SALPINGO OOPHORECTOMY (Bilateral: Abdomen)     Patient location during evaluation: PACU Anesthesia Type: General Level of consciousness: awake and alert Pain management: pain level controlled Vital Signs Assessment: post-procedure vital signs reviewed and stable Respiratory status: spontaneous breathing, nonlabored ventilation, respiratory function stable and patient connected to nasal cannula oxygen Cardiovascular status: blood pressure returned to baseline and stable Postop Assessment: no apparent nausea or vomiting Anesthetic complications: no   No notable events documented.  Last Vitals:  Vitals:   03/27/21 1445 03/27/21 1500  BP: 110/60 100/62  Pulse: 74 75  Resp: 14 15  Temp:  36.5 C  SpO2: 97% 92%    Last Pain:  Vitals:   03/27/21 1703  TempSrc:   PainSc: [place]

## 2021-03-27 NOTE — Op Note (Signed)
Bilateral Mastectomy with left Sentinel Node Biopsy and left seed targeted lymph node biopsy   Indications: This patient presents with history of left breast cancer, grade 2 invasive ductal carcinoma, UOQ, cT1N1, +/+/-  Pre-operative Diagnosis: left breast cancer  Post-operative Diagnosis: same  Surgeon: Stark Klein   Anesthesia: General endotracheal anesthesia and pectoral block  ASA Class: 2  Procedure Details  The patient was seen in the Holding Room. The risks, benefits, complications, treatment options, and expected outcomes were discussed with the patient. The possibilities of reaction to medication, pulmonary aspiration, bleeding, infection, the need for additional procedures, failure to diagnose a condition, and creating a complication requiring transfusion or operation were discussed with the patient. The patient concurred with the proposed plan, giving informed consent.  The site of surgery properly noted/marked. The patient was taken to Operating Room # 2, identified as Diana Weaver and the procedure verified as Bilateral Mastectomies, left seed localized lymph node biopsy and left sentinel Node Biopsy.   After induction of anesthesia, the bilateral breast and chest were prepped and draped in standard fashion.  A Time Out was held and the above information confirmed. The MagTrace was injected into the left subareolar space.   The right side was addressed first.  The borders of the breast were identified and marked.  The incisions of the breast were drawn out to make sure incision lines were equidistant in length.    The superior incision was made with the #10 blade.  Mastectomy hooks were used to provide elevation of the skin edges, and the cautery was used to create the mastectomy flaps.  The dissection was taken to the fascia of the pectoralis major.  The penetrating vessels were clipped as needed.  The superior flap was taken medially to the lateral sternal border, superiorly  to the inferior border of the clavicle.  The inferior flap was similarly created, inferiorly to the inframammary fold and laterally to the border of the latissimus.  The breast was taken off including the pectoralis fascia and the axillary tail marked.  The wound was irrigated. One 19 Blake drain was placed laterally.   Hemostasis was achieved with cautery. A significant time was spent making sure no bleeding was seen.  The wound was irrigated and closed with a 3-0 Vicryl deep dermal interrupted sutures and 4-0 Vicryl subcuticular closure in layers.  The left side was addressed similarly.  After the breast was removed, a sentimag probe was used to identify axillary sentinel nodes. The neoprobe was used to find the node with the seed.   Four additional deep level 2 axillary sentinel nodes were removed and submitted to pathology.  The findings are below.  The seed was confirmed with the [facility]. The lymphovascular channels were clipped with metal clips.        The wound was irrigated. One 19 Blake drain was placed laterally.   Hemostasis was achieved with cautery.  Again, the wound was inspected painstakingly for bleeding.  The wound was irrigated and closed with a 3-0 Vicryl deep dermal interrupted sutures and 4-0 Vicryl subcuticular closure in layers.    Sterile dressings were applied. At the end of the operation, all sponge, instrument, and needle counts were correct.  Findings: grossly clear surgical margins, numerous cysts in right breast.  Node with clip and seed enlarged.  This node also took up the MagTrace.  Four additional nodes were identified with the magtrace.  The signal ranged from 300 to 5500.  Estimated Blood Loss: 50 mL  Drains: 19 Fr blake drain in right axilla and 19 Fr blake drain in left axilla                Specimens: right breast, left breast, left axillary sentinel node with seed and four additional axillary sentinel nodes         Complications:  None; patient  tolerated the procedure well.         Disposition: PACU - hemodynamically stable.         Condition: stable

## 2021-03-27 NOTE — Interval H&P Note (Signed)
History and Physical Interval Note:  03/27/2021 7:29 AM  Diana Weaver  has presented today for surgery, with the diagnosis of LEFT BREAST CANCER.  The various methods of treatment have been discussed with the patient and family. After consideration of risks, benefits and other options for treatment, the patient has consented to  Procedure(s): LEFT MASTECTOMY WITH SENTINEL LYMPH NODE BIOPSY (Left) RIGHT TOTAL MASTECTOMY (Right) RADIOACTIVE SEED GUIDED EXCISIONAL LEFT BREAST BIOPSY (Left) LAPAROSCOPIC BILATERAL SALPINGECTOMY (Bilateral) as a surgical intervention.  The patient's history has been reviewed, patient examined, no change in status, stable for surgery.  I have reviewed the patient's chart and labs.  Questions were answered to the patient's satisfaction.     Stark Klein

## 2021-03-27 NOTE — Anesthesia Procedure Notes (Signed)
Anesthesia Regional Block: Pectoralis block   Pre-Anesthetic Checklist: , timeout performed,  Correct Patient, Correct Site, Correct Laterality,  Correct Procedure, Correct Position, site marked,  Risks and benefits discussed,  Surgical consent,  Pre-op evaluation,  At surgeon's request and post-op pain management  Laterality: Right and N/A  Prep: chloraprep       Needles:  Injection technique: Single-shot  Needle Type: Echogenic Stimulator Needle     Needle Length: 9cm  Needle Gauge: 20   Needle insertion depth: 2 cm   Additional Needles:   Procedures:,,,, ultrasound used (permanent image in chart),,    Narrative:  Start time: 03/27/2021 7:22 AM End time: 03/27/2021 7:31 AM Injection made incrementally with aspirations every 5 mL.  Performed by: Personally  Anesthesiologist: Lyn Hollingshead, MD

## 2021-03-27 NOTE — Progress Notes (Signed)
Breast Cancer Bag given to pt  and soft pillows for both arms.

## 2021-03-27 NOTE — Progress Notes (Signed)
03/27/2021  10:15 AM  PATIENT:  Diana Weaver  46 y.o. female  PRE-OPERATIVE DIAGNOSIS:  LEFT BREAST CANCER  POST-OPERATIVE DIAGNOSIS:  LEFT BREAST CANCER  PROCEDURE:  laparotomy with bilateral salpingo oophorectomy, laparoscopy  SURGEON:  Surgeon(s) and Role:    * Everlene Farrier, MD - Primary    * Dian Queen, MD - Assisting  PHYSICIAN ASSISTANT:   ASSISTANTS: none   ANESTHESIA:   general  EBL:  300 mL   BLOOD ADMINISTERED:none  DRAINS: Urinary Catheter (Foley)   LOCAL MEDICATIONS USED:  MARCAINE    and Amount: 10 ml  SPECIMEN:  Source of Specimen:  bilateral fallopian tubes and ovaries  DISPOSITION OF SPECIMEN:  PATHOLOGY  COUNTS:  YES  TOURNIQUET:  * No tourniquets in log *  DICTATION: .Other Dictation: Dictation Number   [id number]  PLAN OF CARE: Admit to inpatient   PATIENT DISPOSITION:  stable in OR    Delay start of Pharmacological VTE agent (>24hrs) due to surgical blood loss or risk of bleeding: not applicable

## 2021-03-27 NOTE — Progress Notes (Signed)
No changes to H&P per patient history Reviewed procedure - laparoscopy with bilateral salpingo oophorectomy All questions answered Patient states she understands and agrees

## 2021-03-27 NOTE — Anesthesia Procedure Notes (Signed)
Anesthesia Regional Block: Pectoralis block   Pre-Anesthetic Checklist: , timeout performed,  Correct Patient, Correct Site, Correct Laterality,  Correct Procedure, Correct Position, site marked,  Risks and benefits discussed,  Surgical consent,  Pre-op evaluation,  At surgeon's request and post-op pain management  Laterality: Left and N/A  Prep: chloraprep       Needles:  Injection technique: Single-shot  Needle Type: Echogenic Stimulator Needle     Needle Length: 9cm  Needle Gauge: 20   Needle insertion depth: 2 cm   Additional Needles:   Procedures:,,,, ultrasound used (permanent image in chart),,    Narrative:  Start time: 03/27/2021 7:12 AM End time: 03/27/2021 7:20 AM Injection made incrementally with aspirations every 5 mL.  Performed by: Personally  Anesthesiologist: Lyn Hollingshead, MD

## 2021-03-27 NOTE — Op Note (Signed)
NAME: Diana Weaver, Diana Weaver MEDICAL RECORD NO: [id number] ACCOUNT NO: [id number] DATE OF BIRTH: Jul 10, 1975 FACILITY: [facility] LOCATION: [facility] PHYSICIAN: Daleen Bo. Lyn Hollingshead, MD  Operative Report   DATE OF PROCEDURE: 03/27/2021  PREOPERATIVE DIAGNOSIS:  Left breast cancer.  POSTOPERATIVE DIAGNOSES:  1.  Left breast cancer. 2.  Left ovarian cyst.  PROCEDURE:  Laparotomy with bilateral salpingo-oophorectomy and laparoscopy.  SURGEON:  Everlene Farrier II, MD  ASSISTANT:  Dian Queen, MD  ANESTHESIA:  General with endotracheal intubation.  Lyn Hollingshead, MD  SPECIMENS:  Bilateral tubes and ovaries to pathology.  ESTIMATED BLOOD LOSS:  300 mL.  INDICATIONS AND CONSENT:  This patient is a 46 year old G2, P1, who has an ER/PR positive left breast cancer.  She is admitted for bilateral mastectomy with Dr. Barry Dienes.  She also requests bilateral salpingo-oophorectomy.  Ultrasound in the office on  03/11/2021, notes several small fibroids, a 2.1 cm resolving cyst on the left ovary and the right ovary was not seen.  Laparoscopy with bilateral salpingo-oophorectomy was discussed with the patient.  Potential risks and complications have been discussed  preoperatively including but not limited to infection, organ damage, bleeding requiring transfusion of blood products with HIV and hepatitis acquisition, DVT, PE, pneumonia, laparotomy.  The patient states she understands and agrees and consent is  signed, on the chart.  FINDINGS: Laparoscopically, the upper abdomen appears grossly normal.  In the pelvis, the uterus is 10-week size, irregular in contour, consistent with leiomyomata.  Anterior and posterior cul-de-sacs were normal.  Right tube and ovary normal.  The left  ovary is noted to be adherent to a large cystic structure.  DESCRIPTION OF PROCEDURE:  The patient was taken to the operating room where she was identified and placed in the dorsal supine position and general anesthesia was induced  via endotracheal intubation.  She was placed in the dorsal lithotomy position.   Timeout was undertaken.  She was prepped vaginally and abdominally with Hibiclens.  A Foley catheter was placed in the bladder.  Examination reveals the uterus to be anteverted.  Hulka tenaculum was placed and she was  draped in sterile fashion.  The infraumbilical and suprapubic areas were injected in the midline with approximately 10 mL of 0.5% plain Marcaine.  Small infraumbilical incision was made and a disposable Veress needle was placed on the first attempt  without difficulty.  A good syringe and drop test are noted.  Two liters of gas were insufflated and good tympany is noted in the right upper quadrant.  Veress needle was removed.  A 10/11 Xcel bladeless disposable trocar sleeve was placed using direct  visualization with a diagnostic scope.  After placement, the operative scope was used.  A small suprapubic incision was made in the midline and a 5 mm disposable trocar was placed under direct visualization.  The above findings were noted.  Decision was  made to proceed with laparotomy.  Instruments and trocar sleeves were removed.  Pfannenstiel incision was then made, incorporating the suprapubic incision.  Dissection was carried out in layers to the peritoneum, which was taken down superiorly and  inferiorly.  Exploration again reveals a normal right tube and ovary.  The left ovary is adherent to a 10 cm smooth translucent cyst that was adherent to the left pelvic sidewall, surrounded by multiple filmy adhesions, with the fallopian tube and  fimbria adherent to this.  The right infundibulopelvic ligament is taken down well clear of the pelvic sidewall and progressive bites were taken, with 0 Monocryl  suture used.  Proximal ligaments were taken down, freeing the specimen.  On the left side,  careful inspection finds a plane below the cyst.  Multiple areas of adhesions are lysed under good visualization.  This  eventually frees the specimen.  This allows the infundibulopelvic ligament well clear of the pelvic sidewall to be taken down and the  proximal ligaments as well.  These are doubly ligated.  Irrigation is carried out.  Several small peritoneal bleeders are noted that are easily controlled with cautery.  The exposed retroperitoneal space is hemostatic.  The ureter was identified well  below this.  Exparel was placed over the area to assure hemostasis.  Anterior peritoneum was closed in running fashion with 0 Monocryl suture.  Fascia was closed in a running fashion with 0 looped PDS.  Subcutaneous layer was closed with interrupted  plain and the skin was closed in a subcuticular fashion with 4-0 Vicryl on a Keith needle.  Benzoin, Steri-Strips, honeycomb and pressure dressing were applied.  The umbilical incision was closed with 0 Vicryl in the deeper subcutaneous tissues under  good visualization.  Skin was closed with interrupted 4-0 Vicryl.  Dermabond was placed.  Hulka tenaculum was removed and no bleeding is noted. All counts are correct. At this point, the patient is in stable condition and Dr. Barry Dienes will begin her procedure.   SHW D: 03/27/2021 10:25:53 am T: 03/27/2021 11:24:00 pm  JOB: [id number]/ [id number]

## 2021-03-27 NOTE — Plan of Care (Signed)
  Problem: Nutrition: Goal: Adequate nutrition will be maintained Outcome: Progressing   Problem: Elimination: Goal: Will not experience complications related to urinary retention Outcome: Progressing   Problem: Pain Managment: Goal: General experience of comfort will improve Outcome: Progressing   

## 2021-03-27 NOTE — Transfer of Care (Signed)
Immediate Anesthesia Transfer of Care Note  Patient: Diana Weaver  Procedure(s) Performed: LEFT MASTECTOMY WITH SENTINEL LYMPH NODE BIOPSY (Left: Breast) RIGHT TOTAL MASTECTOMY (Right: Breast) RADIOACTIVE SEED GUIDED LEFT AXILLARY SENTINEL LYMPH NODE BIOPSY (Left: Axilla) DIAGNOSTIC LAPAROSCOPY  (Abdomen) MINI LAPAROTOMY (Abdomen) OPEN BILATERAL SALPINGO OOPHORECTOMY (Bilateral: Abdomen)  Patient Location: PACU  Anesthesia Type:General  Level of Consciousness: oriented, sedated and patient cooperative  Airway & Oxygen Therapy: Patient Spontanous Breathing and Patient connected to nasal cannula oxygen  Post-op Assessment: Report given to RN and Post -op Vital signs reviewed and stable  Post vital signs: Reviewed  Last Vitals:  Vitals Value Taken Time  BP 91/59 03/27/21 1343  Temp    Pulse 80 03/27/21 1346  Resp 17 03/27/21 1346  SpO2 96 % 03/27/21 1346  Vitals shown include unvalidated device data.  Last Pain:  Vitals:   03/27/21 0613  TempSrc:   PainSc: 1       Patients Stated Pain Goal: 0 (37/29/02 1115)  Complications: No notable events documented.

## 2021-03-27 NOTE — Progress Notes (Signed)
Tolerating liquids Up to BR and void x 2 Pain control good  Today's Vitals   03/27/21 1445 03/27/21 1500 03/27/21 1532 03/27/21 1703  BP: 110/60 100/62    Pulse: 74 75    Resp: 14 15    Temp:  97.7 F (36.5 C)    TempSrc:      SpO2: 97% 92%    Weight:      Height:      PainSc: 3   7  8     Body mass index is 33.13 kg/m.   Abdomen-soft, BS good, dressing C&D LE-PAS on  A/P: Stable         D/W procedure with patient and husband

## 2021-03-27 NOTE — Anesthesia Procedure Notes (Addendum)
Procedure Name: Intubation Date/Time: 03/27/2021 7:53 AM Performed by: Jenne Campus, CRNA Pre-anesthesia Checklist: Patient identified, Emergency Drugs available, Suction available and Patient being monitored Patient Re-evaluated:Patient Re-evaluated prior to induction Oxygen Delivery Method: Circle System Utilized Preoxygenation: Pre-oxygenation with 100% oxygen Induction Type: IV induction Ventilation: Mask ventilation without difficulty Laryngoscope Size: Miller and 2 Tube type: Oral Tube size: 7.0 mm Number of attempts: 1 Airway Equipment and Method: Stylet and Bite block Placement Confirmation: ETT inserted through vocal cords under direct vision, positive ETCO2 and breath sounds checked- equal and bilateral Secured at: 23 cm Tube secured with: Tape Dental Injury: Teeth and Oropharynx as per pre-operative assessment

## 2021-03-27 NOTE — Discharge Instructions (Addendum)
[facility] °[phone] ° °MASTECTOMY: POST OP INSTRUCTIONS ° °Always review your discharge instruction sheet given to you by the facility where your surgery was performed. °IF YOU HAVE DISABILITY OR FAMILY LEAVE FORMS, YOU MUST BRING THEM TO THE OFFICE FOR PROCESSING.   °DO NOT GIVE THEM TO YOUR DOCTOR. °A prescription for pain medication may be given to you upon discharge.  Take your pain medication as prescribed, if needed.  If narcotic pain medicine is not needed, then you may take acetaminophen (Tylenol) or ibuprofen (Advil) as needed. °Take your usually prescribed medications unless otherwise directed. °If you need a refill on your pain medication, please contact your pharmacy.  They will contact our office to request authorization.  Prescriptions will not be filled after 5pm or on week-ends. °You should follow a light diet the first few days after arrival home, such as soup and crackers, etc.  Resume your normal diet the day after surgery. °Most patients will experience some swelling and bruising on the chest and underarm.  Ice packs will help.  Swelling and bruising can take several days to resolve.  °It is common to experience some constipation if taking pain medication after surgery.  Increasing fluid intake and taking a stool softener (such as Colace) will usually help or prevent this problem from occurring.  A mild laxative (Milk of Magnesia or Miralax) should be taken according to package instructions if there are no bowel movements after 48 hours. °Unless discharge instructions indicate otherwise, leave your bandage dry and in place until your next appointment in 3-5 days.  You may take a limited sponge bath.  No tube baths or showers until the drains are removed.  You may have steri-strips (small skin tapes) in place directly over the incision.  These strips should be left on the skin for 7-10 days.  If your surgeon used skin glue on the incision, you may shower in 24 hours.   The glue will flake off over the next 2-3 weeks.  Any sutures or staples will be removed at the office during your follow-up visit. °DRAINS:  If you have drains in place, it is important to keep a list of the amount of drainage produced each day in your drains.  Before leaving the hospital, you should be instructed on drain care.  Call our office if you have any questions about your drains. °ACTIVITIES:  You may resume regular (light) daily activities beginning the next day--such as daily self-care, walking, climbing stairs--gradually increasing activities as tolerated.  You may have sexual intercourse when it is comfortable.  Refrain from any heavy lifting or straining until approved by your doctor. °You may drive when you are no longer taking prescription pain medication, you can comfortably wear a seatbelt, and you can safely maneuver your car and apply brakes. °RETURN TO WORK:  __________________________________________________________ °You should see your doctor in the office for a follow-up appointment approximately 3-5 days after your surgery.  Your doctor’s nurse will typically make your follow-up appointment when she calls you with your pathology report.  Expect your pathology report 2-3 business days after your surgery.  You may call to check if you do not hear from us after three days.   °OTHER INSTRUCTIONS: ______________________________________________________________________________________________ ____________________________________________________________________________________________ °WHEN TO CALL YOUR DOCTOR: °Fever over 101.0 °Nausea and/or vomiting °Extreme swelling or bruising °Continued bleeding from incision. °Increased pain, redness, or drainage from the incision. °The clinic staff is available to answer your questions during regular business hours.  Please don’t hesitate   to call and ask to speak to one of the nurses for clinical concerns.  If you have a medical emergency, go to the  nearest emergency room or call 911.  A surgeon from [facility] is always on call at the hospital. °[address], [address], [place], [place]  [address] ? [address] [address], [place],    [address] °[phone] ? [phone] ? FAX [phone] ° ° °

## 2021-03-28 ENCOUNTER — Encounter (HOSPITAL_COMMUNITY): Payer: Self-pay | Admitting: General Surgery

## 2021-03-28 LAB — CBC
HCT: 30.1 % — ABNORMAL LOW (ref 36.0–46.0)
Hemoglobin: 9.7 g/dL — ABNORMAL LOW (ref 12.0–15.0)
MCH: 28 pg (ref 26.0–34.0)
MCHC: 32.2 g/dL (ref 30.0–36.0)
MCV: 87 fL (ref 80.0–100.0)
Platelets: 238 10*3/uL (ref 150–400)
RBC: 3.46 MIL/uL — ABNORMAL LOW (ref 3.87–5.11)
RDW: 13.2 % (ref 11.5–15.5)
WBC: 14.1 10*3/uL — ABNORMAL HIGH (ref 4.0–10.5)
nRBC: 0 % (ref 0.0–0.2)

## 2021-03-28 LAB — BASIC METABOLIC PANEL
Anion gap: 8 (ref 5–15)
BUN: 12 mg/dL (ref 6–20)
CO2: 25 mmol/L (ref 22–32)
Calcium: 8.2 mg/dL — ABNORMAL LOW (ref 8.9–10.3)
Chloride: 102 mmol/L (ref 98–111)
Creatinine, Ser: 0.82 mg/dL (ref 0.44–1.00)
GFR, Estimated: >60 mL/min (ref >60)
Glucose, Bld: 169 mg/dL — ABNORMAL HIGH (ref 70–99)
Potassium: 4.1 mmol/L (ref 3.5–5.1)
Sodium: 135 mmol/L (ref 135–145)

## 2021-03-28 MED ORDER — KETOROLAC TROMETHAMINE 30 MG/ML IJ SOLN
30.0000 mg | Freq: Once | INTRAMUSCULAR | Status: AC
Start: 2021-03-28 — End: 2021-03-28
  Administered 2021-03-28: 30 mg via INTRAVENOUS
  Filled 2021-03-28: qty 1

## 2021-03-28 MED ORDER — OXYCODONE HCL 5 MG PO TABS
5.0000 mg | ORAL_TABLET | ORAL | Status: DC | PRN
  Administered 2021-03-28 – 2021-03-29 (×3): 15 mg via ORAL
  Filled 2021-03-28 (×3): qty 3

## 2021-03-28 MED ORDER — IBUPROFEN 600 MG PO TABS: 600.0000 mg | ORAL_TABLET | Freq: Four times a day (QID) | ORAL | 1 refills | Status: DC | PRN

## 2021-03-28 MED ORDER — IBUPROFEN 800 MG PO TABS: 800.0000 mg | ORAL_TABLET | Freq: Three times a day (TID) | ORAL | Status: DC | PRN

## 2021-03-28 MED ORDER — SENNA 8.6 MG PO TABS: 1.0000 | ORAL_TABLET | Freq: Two times a day (BID) | ORAL | 0 refills | Status: DC

## 2021-03-28 MED ORDER — GABAPENTIN 300 MG PO CAPS: 300.0000 mg | ORAL_CAPSULE | Freq: Two times a day (BID) | ORAL | 1 refills | Status: DC

## 2021-03-28 MED ORDER — OXYCODONE HCL 5 MG PO TABS: 5.0000 mg | ORAL_TABLET | ORAL | 0 refills | Status: DC | PRN

## 2021-03-28 MED ORDER — METHOCARBAMOL 500 MG PO TABS: 500.0000 mg | ORAL_TABLET | Freq: Four times a day (QID) | ORAL | 2 refills | Status: DC | PRN

## 2021-03-28 NOTE — Progress Notes (Signed)
1 Day Post-Op   Subjective/Chief Complaint: Pt had quite a bit of pain last night, unsurprisingly given pfannenstiel incision and bilateral mastectomies.  Required multiple IV doses of pain meds.   No n/v.    Objective: Vital signs in last 24 hours: Temp:  [97.4 F (36.3 C)-98.6 F (37 C)] 98.3 F (36.8 C) (01/20 0744) Pulse Rate:  [71-96] 84 (01/20 0744) Resp:  [13-18] 18 (01/20 0744) BP: (91-112)/(49-63) 112/63 (01/20 0744) SpO2:  [92 %-100 %] 96 % (01/20 0744) Last BM Date: 03/26/21  Intake/Output from previous day: 01/19 0701 - 01/20 0700 In: 3000 [I.V.:3000] Out: 1130 [Urine:400; Drains:180; Blood:550] Intake/Output this shift: No intake/output data recorded.  Alert and oriented.  Looks stiff and uncomfortable Breathing unlabored. Chest wall without evidence of hematoma on either side. Drains serosanguinous Abd soft, flat.     Lab Results:  Recent Labs    03/28/21 0140  WBC 14.1*  HGB 9.7*  HCT 30.1*  PLT 238   BMET Recent Labs    03/28/21 0140  NA 135  K 4.1  CL 102  CO2 25  GLUCOSE 169*  BUN 12  CREATININE 0.82  CALCIUM 8.2*   PT/INR No results for input(s): LABPROT, INR in the last 72 hours. ABG No results for input(s): PHART, HCO3 in the last 72 hours.  Invalid input(s): PCO2, PO2  Studies/Results:/ MM Breast Surgical Specimen  Result Date: 03/27/2021 CLINICAL DATA:  Status post seed localized LEFT axillary excision. EXAM: SPECIMEN RADIOGRAPH OF THE LEFT AXILLA COMPARISON:  Previous exam(s). FINDINGS: Status post excision of the left axilla. The radioactive seed and tri bell biopsy marker clip are present, completely intact, and were marked for pathology. IMPRESSION: Specimen radiograph of the left breast. Electronically Signed   By: Nolon Nations M.D.   On: 03/27/2021 13:30   Anti-infectives: Anti-infectives (From admission, onward)    Start     Dose/Rate Route Frequency Ordered Stop   03/27/21 2000  ceFAZolin (ANCEF) IVPB 2g/100 mL  premix        2 g 200 mL/hr over 30 Minutes Intravenous Every 8 hours 03/27/21 1520 03/28/21 0559   03/27/21 0600  ceFAZolin (ANCEF) IVPB 2g/100 mL premix        2 g 200 mL/hr over 30 Minutes Intravenous On call to O.R. 03/27/21 0555 03/27/21 1155   03/27/21 0600  ceFAZolin (ANCEF) IVPB 2g/100 mL premix  Status:  Discontinued        2 g 200 mL/hr over 30 Minutes Intravenous On call to O.R. 03/27/21 0556 03/27/21 0600       Assessment/Plan: s/p Procedure(s): LEFT MASTECTOMY WITH SENTINEL LYMPH NODE BIOPSY (Left), RADIOACTIVE SEED GUIDED LEFT AXILLARY SENTINEL LYMPH NODE BIOPSY (Left) RIGHT TOTAL MASTECTOMY (Right)- Byanka Landrus /03/27/20  DIAGNOSTIC LAPAROSCOPY  (N/A) MINI LAPAROTOMY (N/A) OPEN BILATERAL SALPINGO OOPHORECTOMY (Bilateral)- tomblin 03/27/20 Pt definitely not ready for discharge. Change oxy to 5-15 mg q 4 hours prn Add scheduled ibuprofen after a 1 time dose of toradol Continue robaxin and gabapentin. Diet as tolerated.    LOS: 1 day    Stark Klein 03/28/2021

## 2021-03-28 NOTE — Plan of Care (Signed)

## 2021-03-28 NOTE — Progress Notes (Signed)
POD #1  Up to BR without dizziness. Voiding and passing flatus, tolerating regular diet, no N/V. Has pain at both surgical sites. Just received morphine for breakthrough pain control  Today's Vitals   03/27/21 1900 03/28/21 0100 03/28/21 0441 03/28/21 0744  BP:   108/62 112/63  Pulse:   96 84  Resp:    18  Temp:   98.6 F (37 C) 98.3 F (36.8 C)  TempSrc:   Oral   SpO2:   94% 96%  Weight:      Height:      PainSc: 4  Asleep     Body mass index is 33.13 kg/m.   NAD Abdomen soft, good BS Pressure dressing removed-scant dried blood on honeycomb dressing  Results for orders placed or performed during the hospital encounter of 03/27/21 (from the past 24 hour(s))  Glucose, capillary     Status: Abnormal   Collection Time: 03/27/21  1:44 PM  Result Value Ref Range   Glucose-Capillary 250 (H) 70 - 99 mg/dL  CBC     Status: Abnormal   Collection Time: 03/28/21  1:40 AM  Result Value Ref Range   WBC 14.1 (H) 4.0 - 10.5 K/uL   RBC 3.46 (L) 3.87 - 5.11 MIL/uL   Hemoglobin 9.7 (L) 12.0 - 15.0 g/dL   HCT 30.1 (L) 36.0 - 46.0 %   MCV 87.0 80.0 - 100.0 fL   MCH 28.0 26.0 - 34.0 pg   MCHC 32.2 30.0 - 36.0 g/dL   RDW 13.2 11.5 - 15.5 %   Platelets 238 150 - 400 K/uL   nRBC 0.0 0.0 - 0.2 %  Basic metabolic panel     Status: Abnormal   Collection Time: 03/28/21  1:40 AM  Result Value Ref Range   Sodium 135 135 - 145 mmol/L   Potassium 4.1 3.5 - 5.1 mmol/L   Chloride 102 98 - 111 mmol/L   CO2 25 22 - 32 mmol/L   Glucose, Bld 169 (H) 70 - 99 mg/dL   BUN 12 6 - 20 mg/dL   Creatinine, Ser 0.82 0.44 - 1.00 mg/dL   Calcium 8.2 (L) 8.9 - 10.3 mg/dL   GFR, Estimated >60 >60 mL/min   Anion gap 8 5 - 15    A/P: Stable         D/W discharge instructions. Appointment with me 1-2 weeks         Dr Lynnette Caffey is on call for our group this weekend if needed

## 2021-03-29 MED ORDER — GABAPENTIN 300 MG PO CAPS: 300.0000 mg | ORAL_CAPSULE | Freq: Every day | ORAL | 1 refills | Status: DC

## 2021-03-29 NOTE — Progress Notes (Signed)
2 Days Post-Op   Subjective/Chief Complaint: Pain controlled today, ready for dc   Objective: Vital signs in last 24 hours: Temp:  [98.4 F (36.9 C)-98.9 F (37.2 C)] 98.4 F (36.9 C) (01/21 0459) Pulse Rate:  [93-108] 93 (01/21 0459) Resp:  [17-19] 17 (01/21 0459) BP: (99-104)/(57-63) 104/63 (01/21 0459) SpO2:  [93 %-96 %] 96 % (01/21 0459) Last BM Date: 03/26/21  Intake/Output from previous day: 01/20 0701 - 01/21 0700 In: 840 [P.O.:840] Out: 210 [Drains:210] Intake/Output this shift: No intake/output data recorded.   Chest wall without evidence of hematoma on either side. Drains serosanguinous Abd soft, flat.    Lab Results:  Recent Labs    03/28/21 0140  WBC 14.1*  HGB 9.7*  HCT 30.1*  PLT 238   BMET Recent Labs    03/28/21 0140  NA 135  K 4.1  CL 102  CO2 25  GLUCOSE 169*  BUN 12  CREATININE 0.82  CALCIUM 8.2*   PT/INR No results for input(s): LABPROT, INR in the last 72 hours. ABG No results for input(s): PHART, HCO3 in the last 72 hours.  Invalid input(s): PCO2, PO2  Studies/Results: MM Breast Surgical Specimen  Result Date: 03/27/2021 CLINICAL DATA:  Status post seed localized LEFT axillary excision. EXAM: SPECIMEN RADIOGRAPH OF THE LEFT AXILLA COMPARISON:  Previous exam(s). FINDINGS: Status post excision of the left axilla. The radioactive seed and tri bell biopsy marker clip are present, completely intact, and were marked for pathology. IMPRESSION: Specimen radiograph of the left breast. Electronically Signed   By: Nolon Nations M.D.   On: 03/27/2021 13:30   Anti-infectives: Anti-infectives (From admission, onward)    Start     Dose/Rate Route Frequency Ordered Stop   03/27/21 2000  ceFAZolin (ANCEF) IVPB 2g/100 mL premix        2 g 200 mL/hr over 30 Minutes Intravenous Every 8 hours 03/27/21 1520 03/28/21 0559   03/27/21 0600  ceFAZolin (ANCEF) IVPB 2g/100 mL premix        2 g 200 mL/hr over 30 Minutes Intravenous On call to O.R.  03/27/21 0555 03/27/21 1155   03/27/21 0600  ceFAZolin (ANCEF) IVPB 2g/100 mL premix  Status:  Discontinued        2 g 200 mL/hr over 30 Minutes Intravenous On call to O.R. 03/27/21 0556 03/27/21 0600       Assessment/Plan: POD 2 open bso/bilateral mastectomies with left ax sn biopsy -dc home today   Rolm Bookbinder 03/29/2021

## 2021-03-31 LAB — SURGICAL PATHOLOGY

## 2021-04-01 ENCOUNTER — Encounter: Payer: Self-pay | Admitting: *Deleted

## 2021-04-01 ENCOUNTER — Telehealth: Payer: Self-pay | Admitting: General Surgery

## 2021-04-01 NOTE — Discharge Summary (Signed)
Physician Discharge Summary  Patient ID: EVERLIE EBLE MRN: [id number] DOB/AGE: [birth year] 46 y.o.  Admit date: 03/27/2021 Discharge date: 03/29/2021  Admission Diagnoses: Patient Active Problem List   Diagnosis Date Noted   Breast cancer metastasized to axillary lymph node, left ([place]) 03/27/2021   Genetic testing 02/18/2021   Family history of melanoma 02/05/2021   Malignant neoplasm of upper-outer quadrant of left breast in female, estrogen receptor positive ([place]) 02/04/2021   Obesity (BMI 30-39.9) 05/10/2018   Premature contraction 05/10/2018    Discharge Diagnoses:  Principal Problem:   Breast cancer metastasized to axillary lymph node, left ([place]) Endometriosis  Discharged Condition: stable  Hospital Course:  Patient was admitted to the floor following bilateral mastectomies, left seed targeted lymph node biopsy, left sentinel lymph node biopsy, laparoscopic converted to open bilateral salpingo-oophorectomy 03/27/21.  As expected, the patient did have a fair amount of pain overnight.  She required some adjustment of her pain medication including timing.  On postop day 1, she was able to mobilize more and had her pain better controlled.  She was able to ambulate and void independently.  She was also able to demonstrate drain management.  She was able to tolerated diet without nausea and vomiting.  She was discharged home in stable condition on postop day 2.  Consults:  GYN  Significant Diagnostic Studies: labs: HCT 30.1 prior to d/c.   Treatments: surgery: see above  Discharge Exam: Blood pressure 120/60, pulse 82, temperature 98 F (36.7 C), temperature source Axillary, resp. rate 18, height 5\' 4"  (1.626 m), weight 87.5 kg, last menstrual period 03/20/2021, SpO2 96 %. General appearance: alert, cooperative, and mild distress Resp: breathing comfortably Chest wall: bilateral chest wall tenderness as expected.  No evidence of flap hematoma, drains serosang Extremities:  extremities normal, atraumatic, no cyanosis or edema Abd: soft, non distended, abd dressing intact.    Disposition: Discharge disposition: 01-Home or Self Care        Allergies as of 03/29/2021       Reactions   Betadine [povidone-iodine] Anaphylaxis   Specifically betadine scrub   Latex Rash   Pistachio Nut (diagnostic) Hives        Medication List     TAKE these medications    gabapentin 300 MG capsule Commonly known as: NEURONTIN Take 1 capsule (300 mg total) by mouth at bedtime.   ibuprofen 600 MG tablet Commonly known as: ADVIL Take 1 tablet (600 mg total) by mouth every 6 (six) hours as needed for headache, moderate pain or mild pain. What changed:  medication strength how much to take reasons to take this   metFORMIN 500 MG tablet Commonly known as: GLUCOPHAGE Take 500 mg by mouth 2 (two) times daily with a meal.   methocarbamol 500 MG tablet Commonly known as: ROBAXIN Take 1 tablet (500 mg total) by mouth every 6 (six) hours as needed for muscle spasms.   oxyCODONE 5 MG immediate release tablet Commonly known as: Oxy IR/ROXICODONE Take 1-3 tablets (5-15 mg total) by mouth every 4 (four) hours as needed for moderate pain, severe pain or breakthrough pain.   senna 8.6 MG Tabs tablet Commonly known as: SENOKOT Take 1 tablet (8.6 mg total) by mouth 2 (two) times daily.   tamoxifen 20 MG tablet Commonly known as: NOLVADEX Take 1 tablet (20 mg total) by mouth daily. Stop taking 1 wk prior to surgery date        Follow-up Information     Stark Klein, MD Follow up  in 2 week(s).   Specialty: General Surgery Contact information: [address] [place] [address] [phone]                 Signed: Stark Klein 04/01/2021, 12:36 PM

## 2021-04-01 NOTE — Telephone Encounter (Signed)
Discussed pathology with patient.  Pain reasonably well controlled.   Will check on post op appt.

## 2021-04-02 ENCOUNTER — Encounter: Payer: Self-pay | Admitting: Hematology

## 2021-04-15 ENCOUNTER — Other Ambulatory Visit: Payer: Self-pay

## 2021-04-15 ENCOUNTER — Telehealth: Payer: Self-pay

## 2021-04-15 MED ORDER — TAMOXIFEN CITRATE 20 MG PO TABS: 20.0000 mg | ORAL_TABLET | Freq: Every day | ORAL | 0 refills | Status: DC

## 2021-04-15 NOTE — Progress Notes (Signed)
Pt called requesting refill on Tamoxifen.  Refill for Tamoxifen sent to pt's preferred pharmacy.

## 2021-04-15 NOTE — Telephone Encounter (Signed)
Pt called wanting a refill on her Tamoxifen and inquiring as to when she can restart her Tamoxifen.  Pt stated she is 3wks post bilateral total mastectomy and Oophorectomy.  Pt stated she still have her drains in place and is scheduled for follow-up with surgeon on Thursday.  Pt stated she's still have limitations on lifting and moving her upper extremities.  Pt stated she has a follow-up appt with her GYN regarding the oophorectomy part of her surgery next week.  Pt also mentioned she has an appt for Radiation Consult on 04/23/2021.  Informed pt that usually Dr. Burr Medico does not recommend her pts to restart Tamoxifen until they have been cleared by the Surgeon to return to work with some limitations but d/t the extent of her 2 surgeries would probably recommend that the pt not restart the Tamoxifen until cleared by both surgeons.  Informed pt that this RN will still consult with Dr. Burr Medico to confirm to hold the Tamoxifen until cleared by both surgeons.  If Dr. Burr Medico agrees to restarting Tamoxifen now, will update pt on Dr. Ernestina Penna response.

## 2021-04-21 NOTE — Progress Notes (Signed)
Radiation Oncology         [phone] ________________________________  Name: Diana Weaver        MRN: [id number]  Date of Service: 04/23/2021 DOB: Jul 17, 1975  EP:PIRJJO, Liane Comber, DO  [name], MD     REFERRING PHYSICIAN: [name], MD   DIAGNOSIS: The encounter diagnosis was Malignant neoplasm of upper-outer quadrant of left breast in female, estrogen receptor positive ([place]).   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  The patient was found on screening mammogram to have a lesion in the left breast at 230 o'clock measuring up to 2 cm and a single appearing axillary lymph node that was suspicious.  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  Her node was also sampled and positive for disease and was also ER positive PR negative HER2 negative.  She did undergo an MRI of both breasts on 02/04/2021, her known disease in the left breast was identified and measured 3 cm, a single left axillary node that had been biopsied was also identified and 2 additional masses in the upper central left breast were identified.  The right breast appeared normal.  She also underwent additional biopsies of the left breast on 02/13/2021. One biopsy was benign, but the other was again consistent with grade 2 invasive ductal carcinoma.  Since her last visit, the patient has undergone MammaPrint testing which was low risk and no systemic chemotherapy is required.  She did undergo additional biopsy of the left breast in the upper outer quadrant anterior and posterior the anterior findings revealed focal intravascular tumor with ductal papilloma pseudo angiomatous stromal hyperplasia and columnar change with calcifications.  The posterior aspect showed grade 2 invasive ductal carcinoma. She underwent bilateral mastectomies with  left sentinel lymph node biopsy on 03/27/2021.  At that same time she also underwent a BSO.  Final pathology showed benign ovaries and fallopian tubes bilaterally but features of endometriosis of the left fallopian tube and benign paratubal cyst on the right.  A simple serous cyst was seen in the left ovary.  Her right breast showed fibroadenomatoid change without malignancy.  Her left showed grade 2 invasive ductal carcinoma measuring 1.8 cm with associated intermediate grade DCIS calcifications associated with her carcinoma were noted and her margins were negative.  5 sampled lymph nodes were removed moved and evaluated and one contained metastatic disease.  She has decided to delay reconstruction and possibly pursue autologous reconstruction after radiotherapy.  She is seen to discuss radiation.   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     LAPAROSCOPY N/A 03/27/2021   Procedure: DIAGNOSTIC LAPAROSCOPY ;  Surgeon: Everlene Farrier, MD;  Location: [facility];  Service: Gynecology;  Laterality: N/A;   LAPAROTOMY N/A 03/27/2021   Procedure: MINI LAPAROTOMY;  Surgeon: Everlene Farrier, MD;  Location: [facility];  Service: Gynecology;  Laterality: N/A;   MASTECTOMY W/ SENTINEL NODE BIOPSY Left 03/27/2021  Procedure: LEFT MASTECTOMY WITH SENTINEL LYMPH NODE BIOPSY;  Surgeon: Stark Klein, MD;  Location: [facility];  Service: General;  Laterality: Left;   RADIOACTIVE SEED GUIDED AXILLARY SENTINEL LYMPH NODE Left 03/27/2021   Procedure: RADIOACTIVE SEED GUIDED LEFT AXILLARY SENTINEL LYMPH NODE BIOPSY;  Surgeon: Stark Klein, MD;  Location: [facility];  Service:  General;  Laterality: Left;   SALPINGOOPHORECTOMY Bilateral 03/27/2021   Procedure: OPEN BILATERAL SALPINGO OOPHORECTOMY;  Surgeon: Everlene Farrier, MD;  Location: [facility];  Service: Gynecology;  Laterality: Bilateral;   TOTAL MASTECTOMY Right 03/27/2021   Procedure: RIGHT TOTAL MASTECTOMY;  Surgeon: Stark Klein, MD;  Location: [facility];  Service: General;  Laterality: Right;     FAMILY HISTORY:  Family History  Problem Relation Age of Onset   Hypertension Mother    Hyperlipidemia Mother    Melanoma Mother 61       forehead; surgery only   Depression Father    Hyperlipidemia Father    Hypertension Father    Alcohol abuse Father    Early death Father    Asthma Sister    Depression Sister    Diabetes Sister    Kidney disease Sister    Obesity Sister    Alcohol abuse Maternal Grandfather    Stroke Maternal Grandfather    Cancer Maternal Grandfather        oral cancer; d. early 3s   Hodgkin's lymphoma Paternal Grandfather        d. <60?   Throat cancer Cousin        maternal female cousin; dx 64s     SOCIAL HISTORY:  reports that she quit smoking about 12 years ago. Her smoking use included cigarettes. She has a 2.50 pack-year smoking history. She has never used smokeless tobacco. She reports current alcohol use. She reports that she does not use drugs.  The patient is married and lives in [place].  She is a Agricultural engineer and also Teacher, adult education courses.   ALLERGIES: Betadine [povidone-iodine], Latex, and Pistachio nut (diagnostic)   MEDICATIONS:  Current Outpatient Medications  Medication Sig Dispense Refill   gabapentin (NEURONTIN) 300 MG capsule Take 1 capsule (300 mg total) by mouth at bedtime. 30 capsule 1   ibuprofen (ADVIL) 600 MG tablet Take 1 tablet (600 mg total) by mouth every 6 (six) hours as needed for headache, moderate pain or mild pain. 90 tablet 1   metFORMIN (GLUCOPHAGE) 500 MG tablet Take 500 mg by mouth 2 (two) times daily with a meal.      methocarbamol (ROBAXIN) 500 MG tablet Take 1 tablet (500 mg total) by mouth every 6 (six) hours as needed for muscle spasms. 30 tablet 2   oxyCODONE (OXY IR/ROXICODONE) 5 MG immediate release tablet Take 1-3 tablets (5-15 mg total) by mouth every 4 (four) hours as needed for moderate pain, severe pain or breakthrough pain. 30 tablet 0   senna (SENOKOT) 8.6 MG TABS tablet Take 1 tablet (8.6 mg total) by mouth 2 (two) times daily. 120 tablet 0   tamoxifen (NOLVADEX) 20 MG tablet Take 1 tablet (20 mg total) by mouth daily. Stop taking 1 wk prior to surgery date 30 tablet 0   No current facility-administered medications for this visit.     REVIEW OF SYSTEMS: On review of systems, the patient reports that she is doing well overall but has had some persistant drainage from her incision line. She did have to keep her JP drains a week longer than she expected and has  had some separation of her left suture line. No other compliants are verbalized.      PHYSICAL EXAM:  Wt Readings from Last 3 Encounters:  03/27/21 193 lb (87.5 kg)  03/24/21 195 lb 8 oz (88.7 kg)  02/18/21 201 lb (91.2 kg)   Temp Readings from Last 3 Encounters:  03/29/21 98 F (36.7 C) (Axillary)  03/24/21 98.3 F (36.8 C) (Oral)  02/18/21 97.7 F (36.5 C)   BP Readings from Last 3 Encounters:  03/29/21 120/60  03/24/21 125/84  02/18/21 122/71   Pulse Readings from Last 3 Encounters:  03/29/21 82  03/24/21 83  02/18/21 87    In general this is a well appearing caucasian female in no acute distress. She's alert and oriented x4 and appropriate throughout the examination. Cardiopulmonary assessment is negative for acute distress and she exhibits normal effort.  Her left mastectomy scar is still healing without erythema but separation and notable dressings in her lateral aspect of the wound.    ECOG =1  0 - Asymptomatic (Fully active, able to carry on all predisease activities without restriction)  1 - Symptomatic but  completely ambulatory (Restricted in physically strenuous activity but ambulatory and able to carry out work of a light or sedentary nature. For example, light housework, office work)  2 - Symptomatic, <50% in bed during the day (Ambulatory and capable of all self care but unable to carry out any work activities. Up and about more than 50% of waking hours)  3 - Symptomatic, >50% in bed, but not bedbound (Capable of only limited self-care, confined to bed or chair 50% or more of waking hours)  4 - Bedbound (Completely disabled. Cannot carry on any self-care. Totally confined to bed or chair)  5 - Death   Eustace Pen MM, Creech RH, Tormey DC, et al. [id number]). "Toxicity and response criteria of the [facility]". [place] Oncol. 5 (6): 649-55    LABORATORY DATA:  Lab Results  Component Value Date   WBC 14.1 (H) 03/28/2021   HGB 9.7 (L) 03/28/2021   HCT 30.1 (L) 03/28/2021   MCV 87.0 03/28/2021   PLT 238 03/28/2021   Lab Results  Component Value Date   NA 135 03/28/2021   K 4.1 03/28/2021   CL 102 03/28/2021   CO2 25 03/28/2021   Lab Results  Component Value Date   ALT 22 02/05/2021   AST 19 02/05/2021   ALKPHOS 69 02/05/2021   BILITOT 0.2 (L) 02/05/2021      RADIOGRAPHY: MM Breast Surgical Specimen  Result Date: 03/27/2021 CLINICAL DATA:  Status post seed localized LEFT axillary excision. EXAM: SPECIMEN RADIOGRAPH OF THE LEFT AXILLA COMPARISON:  Previous exam(s). FINDINGS: Status post excision of the left axilla. The radioactive seed and tri bell biopsy marker clip are present, completely intact, and were marked for pathology. IMPRESSION: Specimen radiograph of the left breast. Electronically Signed   By: Nolon Nations M.D.   On: 03/27/2021 13:30  [place] LT RADIOACTIVE SEED LOC  Result Date: 03/25/2021 CLINICAL DATA:  Patient for preoperative localization of left axillary lymph node. EXAM: ULTRASOUND GUIDED RADIOACTIVE SEED LOCALIZATION OF THE LEFT BREAST  COMPARISON:  Previous exam(s). FINDINGS: Patient presents for radioactive seed localization prior to targeted left node resection. I met with the patient and we discussed the procedure of seed localization including benefits and alternatives. We discussed the high likelihood of a successful procedure. We discussed the risks of the procedure including infection, bleeding, tissue injury and further  surgery. We discussed the low dose of radioactivity involved in the procedure. Informed, written consent was given. The usual time-out protocol was performed immediately prior to the procedure. Using ultrasound guidance, sterile technique, 1% lidocaine and an I-125 radioactive seed, left axillary lymph node was localized using a lateral approach. The follow-up mammogram images confirm the seed in the expected location and were marked for Dr. Barry Dienes. Follow-up survey of the patient confirms presence of the radioactive seed. Order number of I-125 seed:  [id number]. Total activity:  6.314 millicuries reference Date: 01/06/2021 The patient tolerated the procedure well and was released from the [facility]. She was given instructions regarding seed removal. IMPRESSION: Radioactive seed localization left breast. No apparent complications. Electronically Signed   By: Lovey Newcomer M.D.   On: 03/25/2021 09:23  MM CLIP PLACEMENT LEFT  Result Date: 03/25/2021 CLINICAL DATA:  Status post seed placement left axillary lymph node EXAM: DIAGNOSTIC LEFT MAMMOGRAM POST ULTRASOUND-GUIDED RADIOACTIVE SEED PLACEMENT COMPARISON:  Previous exam(s). FINDINGS: Mammographic images were obtained following ultrasound-guided radioactive seed placement. These demonstrate the radioactive seed to be located within the left axillary lymph node adjacent to the biopsy marking clip. IMPRESSION: Appropriate location of the radioactive seed. Final Assessment: Post Procedure Mammograms for Seed Placement Electronically Signed   By: Lovey Newcomer M.D.   On:  03/25/2021 09:24      IMPRESSION/PLAN: 1. Stage IA, [id number] grade 2, ER/PR positive invasive ductal carcinoma of the left breast. Dr. Lisbeth Renshaw reviews the final pathology findings and reviews the nature of early stage breast disease. He recommends external radiotherapy to the chest wall and regional nodes to reduce risks of local recurrence followed by antiestrogen therapy. We discussed the risks, benefits, short, and long term effects of radiotherapy, as well as the curative intent, and the patient is interested in proceeding. Dr. Lisbeth Renshaw discusses the delivery and logistics of radiotherapy and anticipates a course of 6 1/2  weeks of radiotherapy to the left chest wall and regional nodes with deep inspiration breath hold technique. Written consent is obtained and placed in the chart, a copy was provided to the patient. She will simulate on 05/06/21 due to persistent healing. 2. Contraceptive Counseling. The patient has undergone bilateral salpingo-oophorectomy at the time of her breast surgery. She does not need pregnancy testing prior to radiotherapy.   In a visit lasting 45 minutes, greater than 50% of the time was spent face to face reviewing her case, as well as in preparation of, discussing, and coordinating the patient's care.  The above documentation reflects my direct findings during this shared patient visit. Please see the separate note by Dr. Lisbeth Renshaw on this date for the remainder of the patient's plan of care.    Carola Rhine, [facility]    **Disclaimer: This note was dictated with voice recognition software. Similar sounding words can inadvertently be transcribed and this note may contain transcription errors which may not have been corrected upon publication of note.**

## 2021-04-22 ENCOUNTER — Telehealth: Payer: Self-pay

## 2021-04-22 NOTE — Telephone Encounter (Addendum)
Spoke w/ patient, verified identity and reminded patient of her 9:00am-04/23/21 in-person appointment w/ Shona Simpson PA-C. I left my extension [phone] in case patient needs to call. Patient verbalized understanding of information given.

## 2021-04-23 ENCOUNTER — Other Ambulatory Visit: Payer: Self-pay

## 2021-04-23 ENCOUNTER — Ambulatory Visit: Payer: Federal, State, Local not specified - PPO | Admitting: Radiation Oncology

## 2021-04-23 ENCOUNTER — Ambulatory Visit: Payer: BC Managed Care – PPO | Admitting: Radiation Oncology

## 2021-04-23 ENCOUNTER — Ambulatory Visit
Admission: RE | Admit: 2021-04-23 | Discharge: 2021-04-23 | Disposition: A | Discharge status: Home / Self Care | Payer: BC Managed Care – PPO | Source: Ambulatory Visit | Attending: Radiation Oncology | Admitting: Radiation Oncology

## 2021-04-23 ENCOUNTER — Encounter: Payer: Self-pay | Admitting: Radiation Oncology

## 2021-04-23 VITALS — BP 124/83 | HR 87 | Temp 98.9°F | Resp 18 | Ht 64.0 in | Wt 187.0 lb

## 2021-04-23 DIAGNOSIS — Z803 Family history of malignant neoplasm of breast: Secondary | ICD-10-CM | POA: Insufficient documentation

## 2021-04-23 DIAGNOSIS — Z87891 Personal history of nicotine dependence: Secondary | ICD-10-CM | POA: Insufficient documentation

## 2021-04-23 DIAGNOSIS — Z170 Estrogen receptor positive status [ER+]: Secondary | ICD-10-CM | POA: Insufficient documentation

## 2021-04-23 DIAGNOSIS — Z7984 Long term (current) use of oral hypoglycemic drugs: Secondary | ICD-10-CM | POA: Insufficient documentation

## 2021-04-23 DIAGNOSIS — Z808 Family history of malignant neoplasm of other organs or systems: Secondary | ICD-10-CM | POA: Diagnosis not present

## 2021-04-23 DIAGNOSIS — Z87442 Personal history of urinary calculi: Secondary | ICD-10-CM | POA: Insufficient documentation

## 2021-04-23 DIAGNOSIS — Z791 Long term (current) use of non-steroidal anti-inflammatories (NSAID): Secondary | ICD-10-CM | POA: Insufficient documentation

## 2021-04-23 DIAGNOSIS — C50412 Malignant neoplasm of upper-outer quadrant of left female breast: Secondary | ICD-10-CM | POA: Diagnosis not present

## 2021-04-23 NOTE — Progress Notes (Signed)
Spoke w/ patient, verified identity, and begin nursing interview, w/ spouse Marlina Cataldi in attendance. Patient reports LT breast discomfort w/ LT arm tenderness, but has full range of motion bilaterally. No other issues reported at this time.  Meaningful use complete. salpingo oophorectomy-No chances of pregnancy.  BP 124/83 (BP Location: Right Arm, Patient Position: Sitting, Cuff Size: Normal)    Pulse 87    Temp 98.9 F (37.2 C) (Temporal)    Resp 18    Ht 5\' 4"  (1.626 m)    Wt 187 lb (84.8 kg)    SpO2 97%    BMI 32.10 kg/m

## 2021-04-29 ENCOUNTER — Encounter: Payer: Self-pay | Admitting: *Deleted

## 2021-05-06 ENCOUNTER — Other Ambulatory Visit: Payer: Self-pay

## 2021-05-06 ENCOUNTER — Ambulatory Visit
Admission: RE | Admit: 2021-05-06 | Discharge: 2021-05-06 | Disposition: A | Discharge status: Home / Self Care | Payer: Federal, State, Local not specified - PPO | Source: Ambulatory Visit | Attending: Radiation Oncology | Admitting: Radiation Oncology

## 2021-05-14 ENCOUNTER — Encounter (HOSPITAL_COMMUNITY): Payer: Self-pay

## 2021-05-15 ENCOUNTER — Encounter: Payer: Self-pay | Admitting: *Deleted

## 2021-05-19 ENCOUNTER — Encounter: Payer: Self-pay | Admitting: Radiation Oncology

## 2021-05-27 ENCOUNTER — Ambulatory Visit: Payer: Federal, State, Local not specified - PPO | Admitting: Radiation Oncology

## 2021-05-27 DIAGNOSIS — Z170 Estrogen receptor positive status [ER+]: Secondary | ICD-10-CM | POA: Diagnosis not present

## 2021-05-27 DIAGNOSIS — C50412 Malignant neoplasm of upper-outer quadrant of left female breast: Secondary | ICD-10-CM | POA: Diagnosis not present

## 2021-06-02 ENCOUNTER — Encounter: Payer: Self-pay | Admitting: *Deleted

## 2021-06-03 DIAGNOSIS — C50412 Malignant neoplasm of upper-outer quadrant of left female breast: Secondary | ICD-10-CM | POA: Diagnosis not present

## 2021-06-10 ENCOUNTER — Other Ambulatory Visit: Payer: Self-pay

## 2021-06-10 ENCOUNTER — Ambulatory Visit
Admission: RE | Admit: 2021-06-10 | Discharge: 2021-06-10 | Disposition: A | Discharge status: Home / Self Care | Payer: BC Managed Care – PPO | Source: Ambulatory Visit | Attending: Radiation Oncology | Admitting: Radiation Oncology

## 2021-06-10 DIAGNOSIS — K769 Liver disease, unspecified: Secondary | ICD-10-CM | POA: Insufficient documentation

## 2021-06-10 DIAGNOSIS — C50412 Malignant neoplasm of upper-outer quadrant of left female breast: Secondary | ICD-10-CM | POA: Insufficient documentation

## 2021-06-10 DIAGNOSIS — Z170 Estrogen receptor positive status [ER+]: Secondary | ICD-10-CM | POA: Diagnosis not present

## 2021-06-12 DIAGNOSIS — C50412 Malignant neoplasm of upper-outer quadrant of left female breast: Secondary | ICD-10-CM | POA: Diagnosis not present

## 2021-06-12 DIAGNOSIS — Z170 Estrogen receptor positive status [ER+]: Secondary | ICD-10-CM | POA: Diagnosis not present

## 2021-06-13 ENCOUNTER — Other Ambulatory Visit: Payer: Self-pay

## 2021-06-13 DIAGNOSIS — Z170 Estrogen receptor positive status [ER+]: Secondary | ICD-10-CM | POA: Diagnosis not present

## 2021-06-13 DIAGNOSIS — K769 Liver disease, unspecified: Secondary | ICD-10-CM | POA: Diagnosis not present

## 2021-06-13 DIAGNOSIS — C50412 Malignant neoplasm of upper-outer quadrant of left female breast: Secondary | ICD-10-CM | POA: Diagnosis not present

## 2021-06-16 ENCOUNTER — Encounter: Payer: Self-pay | Admitting: Hematology

## 2021-06-16 NOTE — Progress Notes (Unsigned)
REQUEST RECEIVED FROM [facility] FOR PATHOLOGY REPORT, FAXED TO [phone], CONFIRMATION RECEIVED.  ?

## 2021-06-17 ENCOUNTER — Other Ambulatory Visit: Payer: Self-pay

## 2021-06-17 ENCOUNTER — Encounter: Payer: Self-pay | Admitting: *Deleted

## 2021-06-17 ENCOUNTER — Ambulatory Visit
Admission: RE | Admit: 2021-06-17 | Discharge: 2021-06-17 | Disposition: A | Discharge status: Home / Self Care | Payer: BC Managed Care – PPO | Source: Ambulatory Visit | Attending: Radiation Oncology | Admitting: Radiation Oncology

## 2021-06-17 DIAGNOSIS — Z9011 Acquired absence of right breast and nipple: Secondary | ICD-10-CM | POA: Diagnosis not present

## 2021-06-17 DIAGNOSIS — Z170 Estrogen receptor positive status [ER+]: Secondary | ICD-10-CM | POA: Diagnosis not present

## 2021-06-17 DIAGNOSIS — Z510 Encounter for antineoplastic radiation therapy: Secondary | ICD-10-CM | POA: Diagnosis not present

## 2021-06-17 DIAGNOSIS — C50912 Malignant neoplasm of unspecified site of left female breast: Secondary | ICD-10-CM | POA: Diagnosis not present

## 2021-06-17 DIAGNOSIS — C50412 Malignant neoplasm of upper-outer quadrant of left female breast: Secondary | ICD-10-CM | POA: Diagnosis not present

## 2021-06-17 DIAGNOSIS — K769 Liver disease, unspecified: Secondary | ICD-10-CM | POA: Diagnosis not present

## 2021-06-17 NOTE — Progress Notes (Signed)
Pt here for patient teaching.  Pt given Radiation and You booklet, skin care instructions, and Radiaplex.  Patient was advised to obtain over the counter aluminum free deodorant.  Reviewed areas of pertinence such as fatigue, hair loss, skin changes, breast tenderness, and breast swelling. Pt able to give teach back of to pat skin and use unscented/gentle soap,apply Radiaplex bid, avoid applying anything to skin within 4 hours of treatment, avoid wearing an under wire bra, and to use an electric razor if they must shave. Pt verbalizes understanding of information given and will contact nursing with any questions or concerns.   ?

## 2021-06-18 ENCOUNTER — Telehealth: Payer: Self-pay | Admitting: Hematology

## 2021-06-18 ENCOUNTER — Ambulatory Visit
Admission: RE | Admit: 2021-06-18 | Discharge: 2021-06-18 | Disposition: A | Discharge status: Home / Self Care | Payer: BC Managed Care – PPO | Source: Ambulatory Visit | Attending: Radiation Oncology | Admitting: Radiation Oncology

## 2021-06-18 DIAGNOSIS — Z170 Estrogen receptor positive status [ER+]: Secondary | ICD-10-CM | POA: Diagnosis not present

## 2021-06-18 DIAGNOSIS — Z510 Encounter for antineoplastic radiation therapy: Secondary | ICD-10-CM | POA: Diagnosis not present

## 2021-06-18 DIAGNOSIS — C50412 Malignant neoplasm of upper-outer quadrant of left female breast: Secondary | ICD-10-CM | POA: Diagnosis not present

## 2021-06-18 DIAGNOSIS — K769 Liver disease, unspecified: Secondary | ICD-10-CM | POA: Diagnosis not present

## 2021-06-18 NOTE — Telephone Encounter (Signed)
Per 4/11 in basket called and spoke to pt and pt confirmed appointment  ?

## 2021-06-19 ENCOUNTER — Other Ambulatory Visit: Payer: Self-pay

## 2021-06-19 ENCOUNTER — Ambulatory Visit
Admission: RE | Admit: 2021-06-19 | Discharge: 2021-06-19 | Disposition: A | Discharge status: Home / Self Care | Payer: BC Managed Care – PPO | Source: Ambulatory Visit | Attending: Radiation Oncology | Admitting: Radiation Oncology

## 2021-06-19 DIAGNOSIS — Z510 Encounter for antineoplastic radiation therapy: Secondary | ICD-10-CM | POA: Diagnosis not present

## 2021-06-19 DIAGNOSIS — K769 Liver disease, unspecified: Secondary | ICD-10-CM | POA: Diagnosis not present

## 2021-06-19 DIAGNOSIS — Z1339 Encounter for screening examination for other mental health and behavioral disorders: Secondary | ICD-10-CM | POA: Diagnosis not present

## 2021-06-19 DIAGNOSIS — C50412 Malignant neoplasm of upper-outer quadrant of left female breast: Secondary | ICD-10-CM | POA: Diagnosis not present

## 2021-06-19 DIAGNOSIS — Z170 Estrogen receptor positive status [ER+]: Secondary | ICD-10-CM | POA: Diagnosis not present

## 2021-06-19 DIAGNOSIS — R7303 Prediabetes: Secondary | ICD-10-CM | POA: Diagnosis not present

## 2021-06-19 DIAGNOSIS — Z1331 Encounter for screening for depression: Secondary | ICD-10-CM | POA: Diagnosis not present

## 2021-06-20 ENCOUNTER — Other Ambulatory Visit: Payer: Self-pay | Admitting: Hematology

## 2021-06-20 ENCOUNTER — Ambulatory Visit
Admission: RE | Admit: 2021-06-20 | Discharge: 2021-06-20 | Disposition: A | Discharge status: Home / Self Care | Payer: BC Managed Care – PPO | Source: Ambulatory Visit | Attending: Radiation Oncology | Admitting: Radiation Oncology

## 2021-06-20 DIAGNOSIS — Z170 Estrogen receptor positive status [ER+]: Secondary | ICD-10-CM

## 2021-06-20 DIAGNOSIS — K769 Liver disease, unspecified: Secondary | ICD-10-CM

## 2021-06-20 DIAGNOSIS — C50412 Malignant neoplasm of upper-outer quadrant of left female breast: Secondary | ICD-10-CM | POA: Diagnosis not present

## 2021-06-20 DIAGNOSIS — Z510 Encounter for antineoplastic radiation therapy: Secondary | ICD-10-CM | POA: Diagnosis not present

## 2021-06-20 MED ORDER — RADIAPLEXRX EX GEL: Freq: Once | CUTANEOUS | Status: AC

## 2021-06-23 ENCOUNTER — Telehealth: Payer: Self-pay

## 2021-06-23 ENCOUNTER — Other Ambulatory Visit: Payer: Self-pay

## 2021-06-23 ENCOUNTER — Ambulatory Visit
Admission: RE | Admit: 2021-06-23 | Discharge: 2021-06-23 | Disposition: A | Discharge status: Home / Self Care | Payer: BC Managed Care – PPO | Source: Ambulatory Visit | Attending: Radiation Oncology | Admitting: Radiation Oncology

## 2021-06-23 DIAGNOSIS — K769 Liver disease, unspecified: Secondary | ICD-10-CM | POA: Diagnosis not present

## 2021-06-23 DIAGNOSIS — Z170 Estrogen receptor positive status [ER+]: Secondary | ICD-10-CM | POA: Diagnosis not present

## 2021-06-23 DIAGNOSIS — C50412 Malignant neoplasm of upper-outer quadrant of left female breast: Secondary | ICD-10-CM | POA: Diagnosis not present

## 2021-06-23 DIAGNOSIS — Z510 Encounter for antineoplastic radiation therapy: Secondary | ICD-10-CM | POA: Diagnosis not present

## 2021-06-23 NOTE — Telephone Encounter (Signed)
[facility] [phone]) to request the images and report from pt's recent CT of Abdomen & Pelvis completed about 2 weeks ago be pushed through [place] in Epic. LVM with pt information and the diagnostic test needed.  Requested if someone from their team could please return my call to confirm.  Awaiting return telephone call. ?

## 2021-06-24 ENCOUNTER — Ambulatory Visit
Admission: RE | Admit: 2021-06-24 | Discharge: 2021-06-24 | Disposition: A | Discharge status: Home / Self Care | Payer: BC Managed Care – PPO | Source: Ambulatory Visit | Attending: Radiation Oncology | Admitting: Radiation Oncology

## 2021-06-24 ENCOUNTER — Other Ambulatory Visit: Payer: Self-pay

## 2021-06-24 DIAGNOSIS — Z9011 Acquired absence of right breast and nipple: Secondary | ICD-10-CM | POA: Diagnosis not present

## 2021-06-24 DIAGNOSIS — Z170 Estrogen receptor positive status [ER+]: Secondary | ICD-10-CM | POA: Diagnosis not present

## 2021-06-24 DIAGNOSIS — C50412 Malignant neoplasm of upper-outer quadrant of left female breast: Secondary | ICD-10-CM | POA: Diagnosis not present

## 2021-06-24 DIAGNOSIS — K769 Liver disease, unspecified: Secondary | ICD-10-CM | POA: Diagnosis not present

## 2021-06-24 DIAGNOSIS — C50912 Malignant neoplasm of unspecified site of left female breast: Secondary | ICD-10-CM | POA: Diagnosis not present

## 2021-06-24 DIAGNOSIS — Z510 Encounter for antineoplastic radiation therapy: Secondary | ICD-10-CM | POA: Diagnosis not present

## 2021-06-24 LAB — RAD ONC ARIA SESSION SUMMARY
Course Elapsed Days: 7
Plan Fractions Treated to Date: 3
Plan Fractions Treated to Date: 6
Plan Prescribed Dose Per Fraction: 1.8 Gy
Plan Prescribed Dose Per Fraction: 1.8 Gy
Plan Total Fractions Prescribed: 14
Plan Total Fractions Prescribed: 28
Plan Total Prescribed Dose: 25.2 Gy
Plan Total Prescribed Dose: 50.4 Gy
Reference Point Dosage Given to Date: 10.8 Gy
Reference Point Dosage Given to Date: 10.8 Gy
Reference Point Session Dosage Given: 1.8 Gy
Reference Point Session Dosage Given: 1.8 Gy
Session Number: 6

## 2021-06-25 ENCOUNTER — Other Ambulatory Visit: Payer: Self-pay

## 2021-06-25 ENCOUNTER — Ambulatory Visit
Admission: RE | Admit: 2021-06-25 | Discharge: 2021-06-25 | Disposition: A | Discharge status: Home / Self Care | Payer: BC Managed Care – PPO | Source: Ambulatory Visit | Attending: Radiation Oncology | Admitting: Radiation Oncology

## 2021-06-25 DIAGNOSIS — Z510 Encounter for antineoplastic radiation therapy: Secondary | ICD-10-CM | POA: Diagnosis not present

## 2021-06-25 DIAGNOSIS — Z170 Estrogen receptor positive status [ER+]: Secondary | ICD-10-CM | POA: Diagnosis not present

## 2021-06-25 DIAGNOSIS — K769 Liver disease, unspecified: Secondary | ICD-10-CM | POA: Diagnosis not present

## 2021-06-25 DIAGNOSIS — C50412 Malignant neoplasm of upper-outer quadrant of left female breast: Secondary | ICD-10-CM | POA: Diagnosis not present

## 2021-06-25 LAB — RAD ONC ARIA SESSION SUMMARY
Course Elapsed Days: 8
Plan Fractions Treated to Date: 4
Plan Fractions Treated to Date: 7
Plan Prescribed Dose Per Fraction: 1.8 Gy
Plan Prescribed Dose Per Fraction: 1.8 Gy
Plan Total Fractions Prescribed: 14
Plan Total Fractions Prescribed: 28
Plan Total Prescribed Dose: 25.2 Gy
Plan Total Prescribed Dose: 50.4 Gy
Reference Point Dosage Given to Date: 12.6 Gy
Reference Point Dosage Given to Date: 12.6 Gy
Reference Point Session Dosage Given: 1.8 Gy
Reference Point Session Dosage Given: 1.8 Gy
Session Number: 7

## 2021-06-26 ENCOUNTER — Ambulatory Visit
Admission: RE | Admit: 2021-06-26 | Discharge: 2021-06-26 | Disposition: A | Discharge status: Home / Self Care | Payer: BC Managed Care – PPO | Source: Ambulatory Visit | Attending: Radiation Oncology | Admitting: Radiation Oncology

## 2021-06-26 ENCOUNTER — Other Ambulatory Visit: Payer: Self-pay

## 2021-06-26 DIAGNOSIS — C50412 Malignant neoplasm of upper-outer quadrant of left female breast: Secondary | ICD-10-CM | POA: Diagnosis not present

## 2021-06-26 DIAGNOSIS — Z170 Estrogen receptor positive status [ER+]: Secondary | ICD-10-CM | POA: Diagnosis not present

## 2021-06-26 DIAGNOSIS — Z510 Encounter for antineoplastic radiation therapy: Secondary | ICD-10-CM | POA: Diagnosis not present

## 2021-06-26 DIAGNOSIS — K769 Liver disease, unspecified: Secondary | ICD-10-CM | POA: Diagnosis not present

## 2021-06-26 LAB — RAD ONC ARIA SESSION SUMMARY
Course Elapsed Days: 9
Plan Fractions Treated to Date: 4
Plan Fractions Treated to Date: 8
Plan Prescribed Dose Per Fraction: 1.8 Gy
Plan Prescribed Dose Per Fraction: 1.8 Gy
Plan Total Fractions Prescribed: 14
Plan Total Fractions Prescribed: 28
Plan Total Prescribed Dose: 25.2 Gy
Plan Total Prescribed Dose: 50.4 Gy
Reference Point Dosage Given to Date: 14.4 Gy
Reference Point Dosage Given to Date: 14.4 Gy
Reference Point Session Dosage Given: 1.8 Gy
Reference Point Session Dosage Given: 1.8 Gy
Session Number: 8

## 2021-06-27 ENCOUNTER — Other Ambulatory Visit: Payer: Self-pay

## 2021-06-27 ENCOUNTER — Ambulatory Visit
Admission: RE | Admit: 2021-06-27 | Discharge: 2021-06-27 | Disposition: A | Discharge status: Home / Self Care | Payer: BC Managed Care – PPO | Source: Ambulatory Visit | Attending: Radiation Oncology | Admitting: Radiation Oncology

## 2021-06-27 DIAGNOSIS — Z170 Estrogen receptor positive status [ER+]: Secondary | ICD-10-CM | POA: Diagnosis not present

## 2021-06-27 DIAGNOSIS — C50412 Malignant neoplasm of upper-outer quadrant of left female breast: Secondary | ICD-10-CM | POA: Diagnosis not present

## 2021-06-27 DIAGNOSIS — K769 Liver disease, unspecified: Secondary | ICD-10-CM | POA: Diagnosis not present

## 2021-06-27 DIAGNOSIS — Z510 Encounter for antineoplastic radiation therapy: Secondary | ICD-10-CM | POA: Diagnosis not present

## 2021-06-27 LAB — RAD ONC ARIA SESSION SUMMARY
Course Elapsed Days: 10
Plan Fractions Treated to Date: 5
Plan Fractions Treated to Date: 9
Plan Prescribed Dose Per Fraction: 1.8 Gy
Plan Prescribed Dose Per Fraction: 1.8 Gy
Plan Total Fractions Prescribed: 14
Plan Total Fractions Prescribed: 28
Plan Total Prescribed Dose: 25.2 Gy
Plan Total Prescribed Dose: 50.4 Gy
Reference Point Dosage Given to Date: 16.2 Gy
Reference Point Dosage Given to Date: 16.2 Gy
Reference Point Session Dosage Given: 1.8 Gy
Reference Point Session Dosage Given: 1.8 Gy
Session Number: 9

## 2021-06-30 ENCOUNTER — Other Ambulatory Visit: Payer: Self-pay

## 2021-06-30 ENCOUNTER — Ambulatory Visit
Admission: RE | Admit: 2021-06-30 | Discharge: 2021-06-30 | Disposition: A | Discharge status: Home / Self Care | Payer: BC Managed Care – PPO | Source: Ambulatory Visit | Attending: Radiation Oncology | Admitting: Radiation Oncology

## 2021-06-30 DIAGNOSIS — Z510 Encounter for antineoplastic radiation therapy: Secondary | ICD-10-CM | POA: Diagnosis not present

## 2021-06-30 DIAGNOSIS — K769 Liver disease, unspecified: Secondary | ICD-10-CM | POA: Diagnosis not present

## 2021-06-30 DIAGNOSIS — Z170 Estrogen receptor positive status [ER+]: Secondary | ICD-10-CM | POA: Diagnosis not present

## 2021-06-30 DIAGNOSIS — C50412 Malignant neoplasm of upper-outer quadrant of left female breast: Secondary | ICD-10-CM | POA: Diagnosis not present

## 2021-06-30 LAB — RAD ONC ARIA SESSION SUMMARY
Course Elapsed Days: 13
Plan Fractions Treated to Date: 10
Plan Fractions Treated to Date: 5
Plan Prescribed Dose Per Fraction: 1.8 Gy
Plan Prescribed Dose Per Fraction: 1.8 Gy
Plan Total Fractions Prescribed: 14
Plan Total Fractions Prescribed: 28
Plan Total Prescribed Dose: 25.2 Gy
Plan Total Prescribed Dose: 50.4 Gy
Reference Point Dosage Given to Date: 18 Gy
Reference Point Dosage Given to Date: 18 Gy
Reference Point Session Dosage Given: 1.8 Gy
Reference Point Session Dosage Given: 1.8 Gy
Session Number: 10

## 2021-07-01 ENCOUNTER — Ambulatory Visit
Admission: RE | Admit: 2021-07-01 | Discharge: 2021-07-01 | Disposition: A | Discharge status: Home / Self Care | Payer: BC Managed Care – PPO | Source: Ambulatory Visit | Attending: Radiation Oncology | Admitting: Radiation Oncology

## 2021-07-01 ENCOUNTER — Other Ambulatory Visit: Payer: Self-pay

## 2021-07-01 ENCOUNTER — Ambulatory Visit (HOSPITAL_COMMUNITY)
Admission: RE | Admit: 2021-07-01 | Discharge: 2021-07-01 | Disposition: A | Discharge status: Home / Self Care | Payer: BC Managed Care – PPO | Source: Ambulatory Visit | Attending: Hematology | Admitting: Hematology

## 2021-07-01 DIAGNOSIS — Z510 Encounter for antineoplastic radiation therapy: Secondary | ICD-10-CM | POA: Diagnosis not present

## 2021-07-01 DIAGNOSIS — R160 Hepatomegaly, not elsewhere classified: Secondary | ICD-10-CM | POA: Diagnosis not present

## 2021-07-01 DIAGNOSIS — K769 Liver disease, unspecified: Secondary | ICD-10-CM | POA: Insufficient documentation

## 2021-07-01 DIAGNOSIS — K5730 Diverticulosis of large intestine without perforation or abscess without bleeding: Secondary | ICD-10-CM | POA: Diagnosis not present

## 2021-07-01 DIAGNOSIS — K760 Fatty (change of) liver, not elsewhere classified: Secondary | ICD-10-CM | POA: Diagnosis not present

## 2021-07-01 DIAGNOSIS — C50412 Malignant neoplasm of upper-outer quadrant of left female breast: Secondary | ICD-10-CM | POA: Diagnosis not present

## 2021-07-01 DIAGNOSIS — Z170 Estrogen receptor positive status [ER+]: Secondary | ICD-10-CM | POA: Diagnosis not present

## 2021-07-01 DIAGNOSIS — K8020 Calculus of gallbladder without cholecystitis without obstruction: Secondary | ICD-10-CM | POA: Diagnosis not present

## 2021-07-01 LAB — RAD ONC ARIA SESSION SUMMARY
Course Elapsed Days: 14
Plan Fractions Treated to Date: 11
Plan Fractions Treated to Date: 6
Plan Prescribed Dose Per Fraction: 1.8 Gy
Plan Prescribed Dose Per Fraction: 1.8 Gy
Plan Total Fractions Prescribed: 14
Plan Total Fractions Prescribed: 28
Plan Total Prescribed Dose: 25.2 Gy
Plan Total Prescribed Dose: 50.4 Gy
Reference Point Dosage Given to Date: 19.8 Gy
Reference Point Dosage Given to Date: 19.8 Gy
Reference Point Session Dosage Given: 1.8 Gy
Reference Point Session Dosage Given: 1.8 Gy
Session Number: 11

## 2021-07-01 IMAGING — MR MR ABDOMEN WO/W CM
19 series · 48 of 48 positions shown · IV contrast (8ML GADAVIST)
Comparison: CT abdomen and pelvis [DATE]

CLINICAL DATA: Liver lesion

EXAM:
MRI ABDOMEN WITHOUT AND WITH CONTRAST
TECHNIQUE: Multiplanar multisequence MR imaging of the abdomen was performed
both before and after the administration of intravenous contrast.
CONTRAST:  8mL GADAVIST GADOBUTROL 1 MMOL/ML IV SOLN

[Series 3: T2 · coronal · 6.0mm · 1.56mm/px · 1 of 35 slices shown]
[im 1/35]
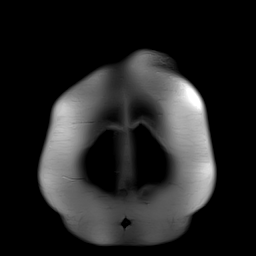

[Series 4: T2 fat-sat · axial · 6.0mm · 1.25mm/px · 1 of 36 slices shown]
[im 1/36]
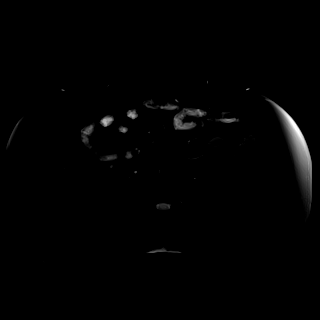

[Series 6: DWI · axial · 6.0mm · 1.49mm/px · z∈[-129,+145]mm · 2 of 78 slices shown (1 of 2)]
[im 1/78]
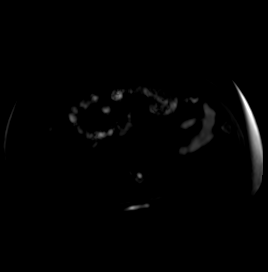
[im 78/78]
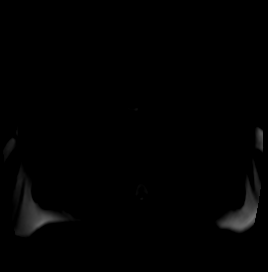

[Series 7: DWI · axial · 6.0mm · 1.49mm/px · 1 of 39 slices shown (2 of 2)]
[im 1/39]
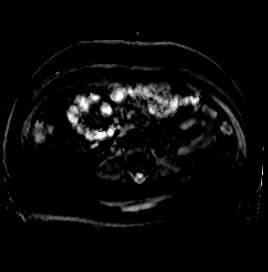

[Series 8: T1 · axial · 3.0mm · 1.25mm/px · z∈[-130,+130]mm · 3 of 88 slices shown (1 of 2)]
[im 1/88]
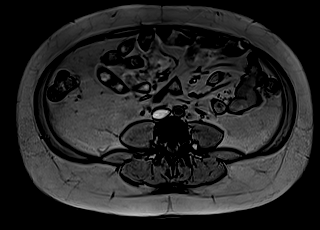
[im 44/88]
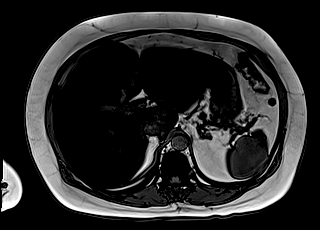
[im 88/88]
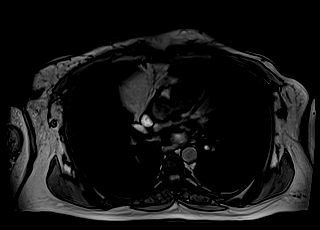

[Series 9: T1 · axial · 3.0mm · 1.25mm/px · z∈[-130,+130]mm · 3 of 88 slices shown (2 of 2)]
[im 1/88]
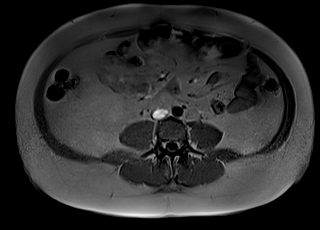
[im 44/88]
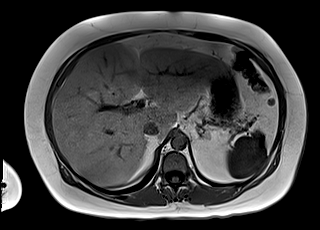
[im 88/88]
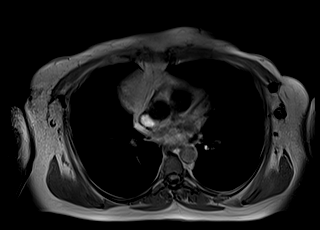

[Series 10: bSSFP · axial · 4.0mm · 0.84mm/px · z∈[-139,+137]mm · 2 of 70 slices shown]
[im 1/70]
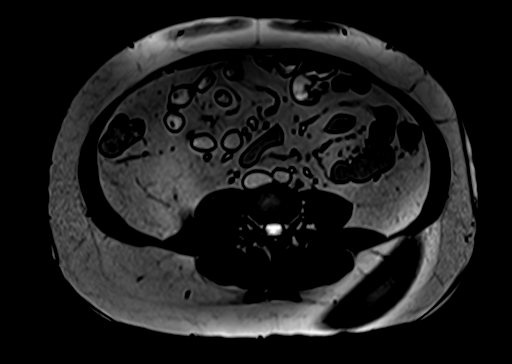
[im 70/70]
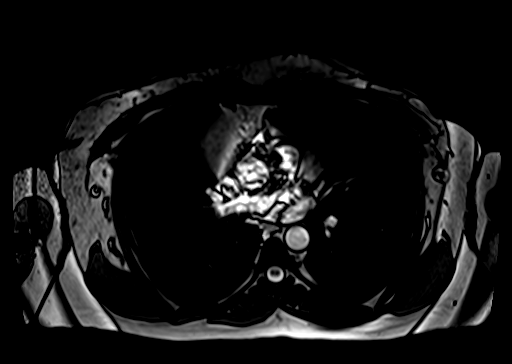

[Series 12: T1 dynamic · axial · 3.0mm · 1.25mm/px · z∈[-140,+121]mm · 3 of 88 slices shown (1 of 12)]
[im 1/88]
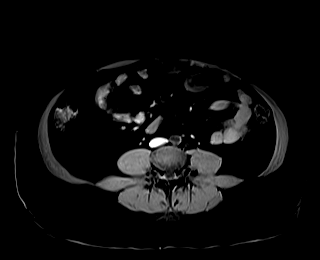
[im 44/88]
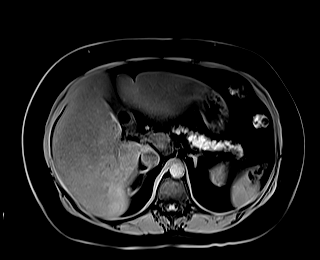
[im 88/88]
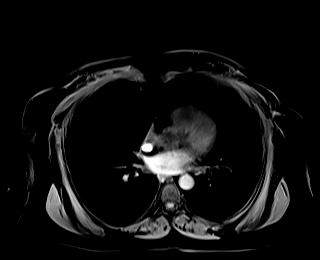

[Series 16: T1 dynamic · axial · 3.0mm · 1.25mm/px · z∈[-140,+121]mm · 3 of 88 slices shown (2 of 12)]
[im 1/88]
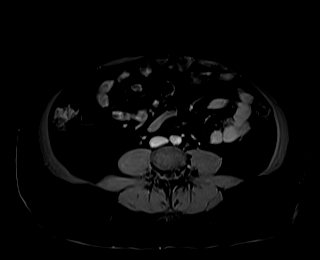
[im 44/88]
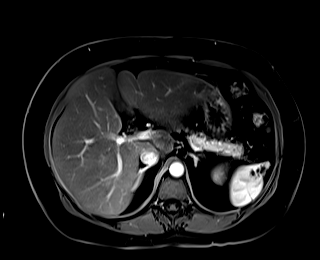
[im 88/88]
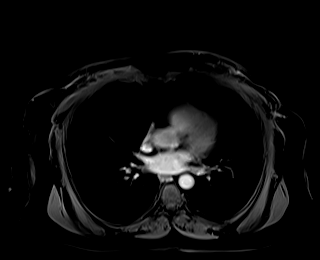

[Series 17: T1 dynamic · axial · 3.0mm · 1.25mm/px · z∈[-140,+121]mm · 3 of 88 slices shown (3 of 12)]
[im 1/88]
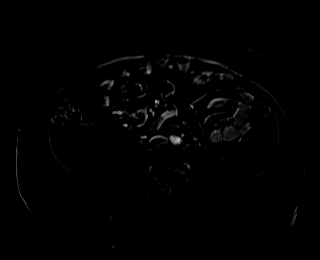
[im 44/88]
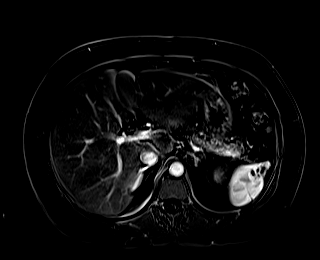
[im 88/88]
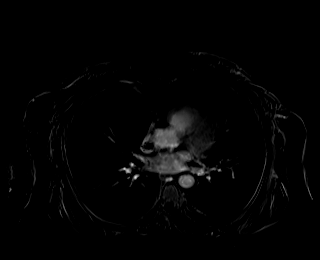

[Series 20: T1 dynamic · axial · 3.0mm · 1.25mm/px · z∈[-140,+121]mm · 3 of 88 slices shown (4 of 12)]
[im 1/88]
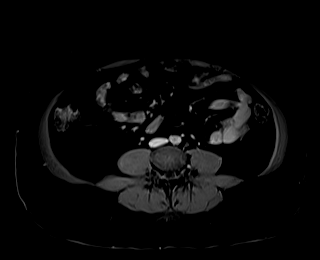
[im 44/88]
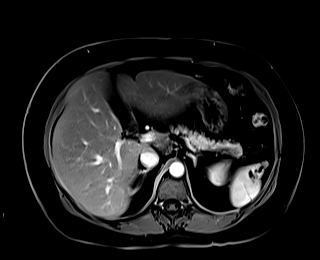
[im 88/88]
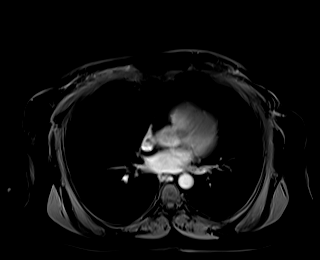

[Series 21: T1 dynamic · axial · 3.0mm · 1.25mm/px · z∈[-140,+121]mm · 3 of 88 slices shown (5 of 12)]
[im 1/88]
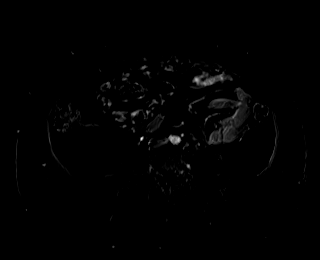
[im 44/88]
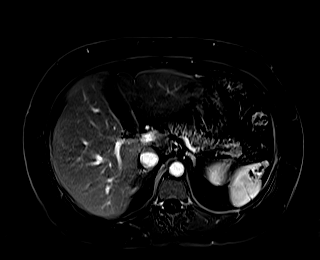
[im 88/88]
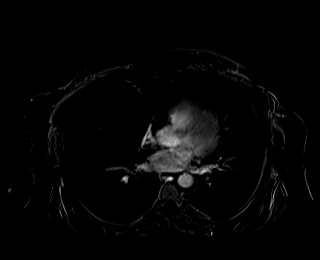

[Series 24: T1 dynamic · axial · 3.0mm · 1.25mm/px · z∈[-140,+121]mm · 3 of 88 slices shown (6 of 12)]
[im 1/88]
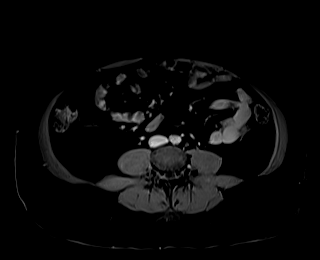
[im 44/88]
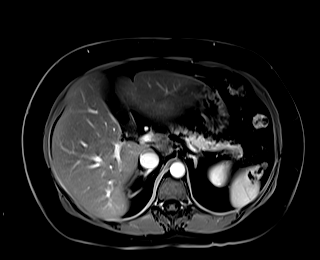
[im 88/88]
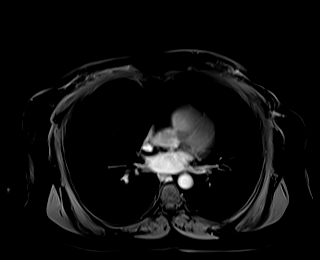

[Series 25: T1 dynamic · axial · 3.0mm · 1.25mm/px · z∈[-140,+121]mm · 3 of 88 slices shown (7 of 12)]
[im 1/88]
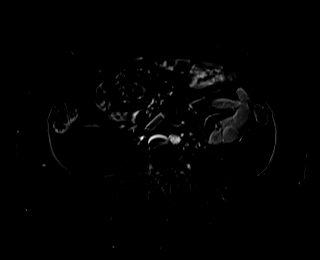
[im 44/88]
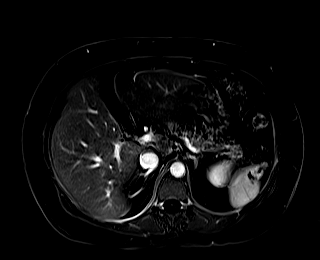
[im 88/88]
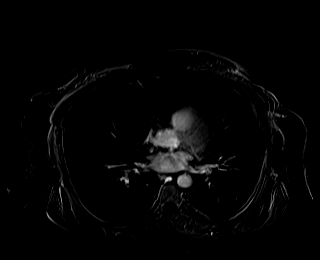

[Series 27: T1 dynamic · coronal · 5.0mm · 1.41mm/px · 2 of 56 slices shown (8 of 12)]
[im 1/56]
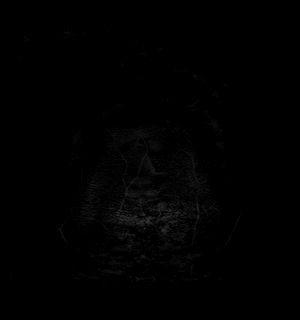
[im 56/56]
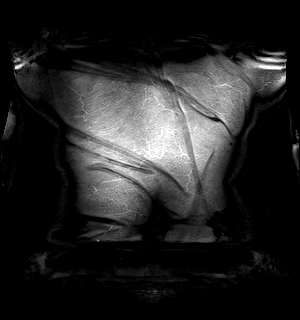

[Series 30: T1 dynamic · axial · 3.0mm · 1.25mm/px · z∈[-140,+121]mm · 3 of 88 slices shown (9 of 12)]
[im 1/88]
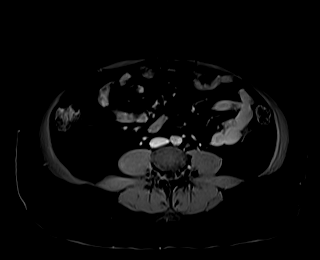
[im 44/88]
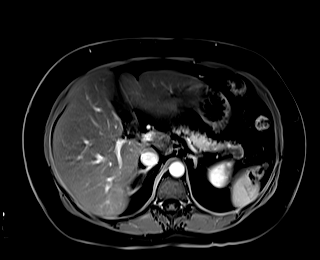
[im 88/88]
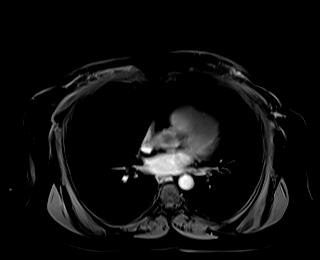

[Series 31: T1 dynamic · axial · 3.0mm · 1.25mm/px · z∈[-140,+121]mm · 3 of 88 slices shown (10 of 12)]
[im 1/88]
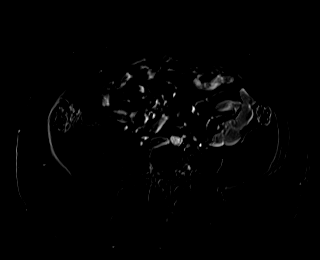
[im 44/88]
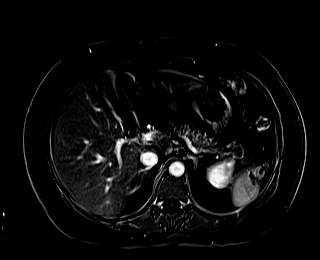
[im 88/88]
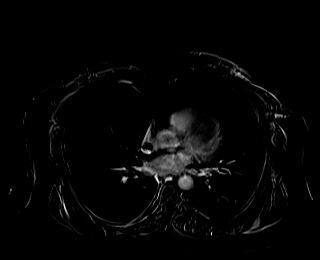

[Series 34: T1 dynamic · axial · 3.0mm · 1.25mm/px · z∈[-140,+121]mm · 3 of 88 slices shown (11 of 12)]
[im 1/88]
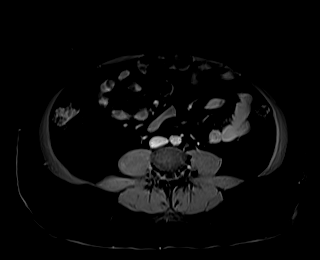
[im 44/88]
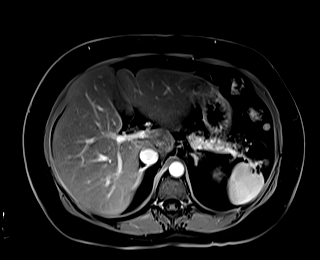
[im 88/88]
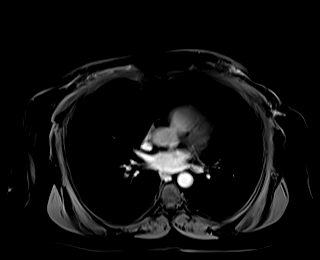

[Series 35: T1 dynamic · axial · 3.0mm · 1.25mm/px · z∈[-140,+121]mm · 3 of 88 slices shown (12 of 12)]
[im 1/88]
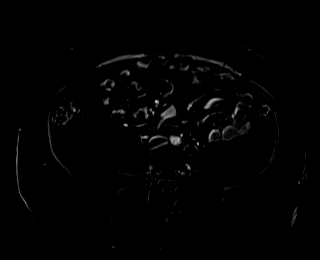
[im 44/88]
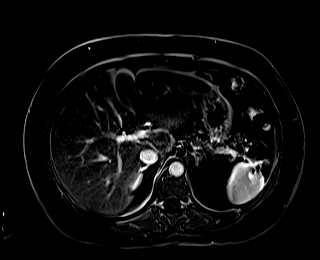
[im 88/88]
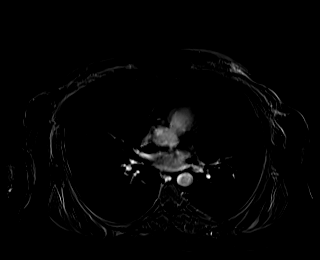

[48 of 48 positions shown; findings below may reference images not displayed]

FINDINGS: Lower chest: No acute findings.

Hepatobiliary: Liver is enlarged measuring 19.2 cm in length. There
is marked hepatic steatosis. There is a 1.9 cm mildly hyperintense
DWI/T2 signal mass in the posterior right hepatic lobe segment 6
which demonstrates early diffuse enhancement with mild enhancement
persisting on all phases. No additional hepatic lesions identified.
A single 1.6 cm gallstone visualized. No gallbladder wall thickening
or surrounding edema. No biliary ductal dilatation.

Pancreas: No mass, inflammatory changes, or other parenchymal
abnormality identified.

Spleen:  Normal size.  Chronic stable lobulation anteroinferiorly.

Adrenals/Urinary Tract: Adrenal glands appear normal. No
hydronephrosis or suspicious renal mass identified bilaterally.

Stomach/Bowel: Colonic diverticulosis. No evidence of bowel
obstruction.

Vascular/Lymphatic: No pathologically enlarged lymph nodes
identified. No abdominal aortic aneurysm demonstrated.

Other:  No ascites

Musculoskeletal: No suspicious bony lesions identified.
IMPRESSION: 1. 1.9 cm enhancing mass in the posterior right hepatic lobe segment
6 which most likely represents hepatic adenoma, or FNH. Consider
follow-up in MRI 3-6 months using Eovist contrast for possible
differentiation.
2. Hepatomegaly and marked hepatic steatosis.
3. Cholelithiasis.
4. Colonic diverticulosis.

## 2021-07-01 MED ORDER — GADOBUTROL 1 MMOL/ML IV SOLN
8.0000 mL | Freq: Once | INTRAVENOUS | Status: AC | PRN
  Administered 2021-07-01: 8 mL via INTRAVENOUS

## 2021-07-02 ENCOUNTER — Other Ambulatory Visit: Payer: Self-pay

## 2021-07-02 ENCOUNTER — Ambulatory Visit
Admission: RE | Admit: 2021-07-02 | Discharge: 2021-07-02 | Disposition: A | Discharge status: Home / Self Care | Payer: BC Managed Care – PPO | Source: Ambulatory Visit | Attending: Radiation Oncology | Admitting: Radiation Oncology

## 2021-07-02 DIAGNOSIS — Z510 Encounter for antineoplastic radiation therapy: Secondary | ICD-10-CM | POA: Diagnosis not present

## 2021-07-02 DIAGNOSIS — Z170 Estrogen receptor positive status [ER+]: Secondary | ICD-10-CM | POA: Diagnosis not present

## 2021-07-02 DIAGNOSIS — K769 Liver disease, unspecified: Secondary | ICD-10-CM | POA: Diagnosis not present

## 2021-07-02 DIAGNOSIS — C50412 Malignant neoplasm of upper-outer quadrant of left female breast: Secondary | ICD-10-CM | POA: Diagnosis not present

## 2021-07-02 LAB — RAD ONC ARIA SESSION SUMMARY
Course Elapsed Days: 15
Plan Fractions Treated to Date: 12
Plan Fractions Treated to Date: 6
Plan Prescribed Dose Per Fraction: 1.8 Gy
Plan Prescribed Dose Per Fraction: 1.8 Gy
Plan Total Fractions Prescribed: 14
Plan Total Fractions Prescribed: 28
Plan Total Prescribed Dose: 25.2 Gy
Plan Total Prescribed Dose: 50.4 Gy
Reference Point Dosage Given to Date: 21.6 Gy
Reference Point Dosage Given to Date: 21.6 Gy
Reference Point Session Dosage Given: 1.8 Gy
Reference Point Session Dosage Given: 1.8 Gy
Session Number: 12

## 2021-07-03 ENCOUNTER — Other Ambulatory Visit: Payer: Self-pay

## 2021-07-03 ENCOUNTER — Ambulatory Visit
Admission: RE | Admit: 2021-07-03 | Discharge: 2021-07-03 | Disposition: A | Discharge status: Home / Self Care | Payer: BC Managed Care – PPO | Source: Ambulatory Visit | Attending: Radiation Oncology | Admitting: Radiation Oncology

## 2021-07-03 DIAGNOSIS — Z510 Encounter for antineoplastic radiation therapy: Secondary | ICD-10-CM | POA: Diagnosis not present

## 2021-07-03 DIAGNOSIS — C50412 Malignant neoplasm of upper-outer quadrant of left female breast: Secondary | ICD-10-CM | POA: Diagnosis not present

## 2021-07-03 DIAGNOSIS — K769 Liver disease, unspecified: Secondary | ICD-10-CM | POA: Diagnosis not present

## 2021-07-03 DIAGNOSIS — Z170 Estrogen receptor positive status [ER+]: Secondary | ICD-10-CM | POA: Diagnosis not present

## 2021-07-03 LAB — RAD ONC ARIA SESSION SUMMARY
Course Elapsed Days: 16
Plan Fractions Treated to Date: 13
Plan Fractions Treated to Date: 7
Plan Prescribed Dose Per Fraction: 1.8 Gy
Plan Prescribed Dose Per Fraction: 1.8 Gy
Plan Total Fractions Prescribed: 14
Plan Total Fractions Prescribed: 28
Plan Total Prescribed Dose: 25.2 Gy
Plan Total Prescribed Dose: 50.4 Gy
Reference Point Dosage Given to Date: 23.4 Gy
Reference Point Dosage Given to Date: 23.4 Gy
Reference Point Session Dosage Given: 1.8 Gy
Reference Point Session Dosage Given: 1.8 Gy
Session Number: 13

## 2021-07-04 ENCOUNTER — Ambulatory Visit
Admission: RE | Admit: 2021-07-04 | Discharge: 2021-07-04 | Disposition: A | Discharge status: Home / Self Care | Payer: BC Managed Care – PPO | Source: Ambulatory Visit | Attending: Radiation Oncology | Admitting: Radiation Oncology

## 2021-07-04 ENCOUNTER — Other Ambulatory Visit: Payer: Self-pay

## 2021-07-04 DIAGNOSIS — C50412 Malignant neoplasm of upper-outer quadrant of left female breast: Secondary | ICD-10-CM | POA: Diagnosis not present

## 2021-07-04 DIAGNOSIS — K769 Liver disease, unspecified: Secondary | ICD-10-CM | POA: Diagnosis not present

## 2021-07-04 DIAGNOSIS — Z170 Estrogen receptor positive status [ER+]: Secondary | ICD-10-CM | POA: Diagnosis not present

## 2021-07-04 DIAGNOSIS — Z510 Encounter for antineoplastic radiation therapy: Secondary | ICD-10-CM | POA: Diagnosis not present

## 2021-07-04 LAB — RAD ONC ARIA SESSION SUMMARY
Course Elapsed Days: 17
Plan Fractions Treated to Date: 14
Plan Fractions Treated to Date: 7
Plan Prescribed Dose Per Fraction: 1.8 Gy
Plan Prescribed Dose Per Fraction: 1.8 Gy
Plan Total Fractions Prescribed: 14
Plan Total Fractions Prescribed: 28
Plan Total Prescribed Dose: 25.2 Gy
Plan Total Prescribed Dose: 50.4 Gy
Reference Point Dosage Given to Date: 25.2 Gy
Reference Point Dosage Given to Date: 25.2 Gy
Reference Point Session Dosage Given: 1.8 Gy
Reference Point Session Dosage Given: 1.8 Gy
Session Number: 14

## 2021-07-07 ENCOUNTER — Other Ambulatory Visit: Payer: Self-pay

## 2021-07-07 ENCOUNTER — Ambulatory Visit
Admission: RE | Admit: 2021-07-07 | Discharge: 2021-07-07 | Disposition: A | Discharge status: Home / Self Care | Payer: BC Managed Care – PPO | Source: Ambulatory Visit | Attending: Radiation Oncology | Admitting: Radiation Oncology

## 2021-07-07 DIAGNOSIS — Z510 Encounter for antineoplastic radiation therapy: Secondary | ICD-10-CM | POA: Diagnosis not present

## 2021-07-07 DIAGNOSIS — Z170 Estrogen receptor positive status [ER+]: Secondary | ICD-10-CM | POA: Diagnosis not present

## 2021-07-07 DIAGNOSIS — C50412 Malignant neoplasm of upper-outer quadrant of left female breast: Secondary | ICD-10-CM | POA: Diagnosis not present

## 2021-07-07 LAB — RAD ONC ARIA SESSION SUMMARY
Course Elapsed Days: 20
Plan Fractions Treated to Date: 15
Plan Fractions Treated to Date: 8
Plan Prescribed Dose Per Fraction: 1.8 Gy
Plan Prescribed Dose Per Fraction: 1.8 Gy
Plan Total Fractions Prescribed: 14
Plan Total Fractions Prescribed: 28
Plan Total Prescribed Dose: 25.2 Gy
Plan Total Prescribed Dose: 50.4 Gy
Reference Point Dosage Given to Date: 27 Gy
Reference Point Dosage Given to Date: 27 Gy
Reference Point Session Dosage Given: 1.8 Gy
Reference Point Session Dosage Given: 1.8 Gy
Session Number: 15

## 2021-07-08 ENCOUNTER — Other Ambulatory Visit: Payer: Self-pay

## 2021-07-08 ENCOUNTER — Ambulatory Visit
Admission: RE | Admit: 2021-07-08 | Discharge: 2021-07-08 | Disposition: A | Discharge status: Home / Self Care | Payer: BC Managed Care – PPO | Source: Ambulatory Visit | Attending: Radiation Oncology | Admitting: Radiation Oncology

## 2021-07-08 DIAGNOSIS — Z170 Estrogen receptor positive status [ER+]: Secondary | ICD-10-CM | POA: Diagnosis not present

## 2021-07-08 DIAGNOSIS — C50412 Malignant neoplasm of upper-outer quadrant of left female breast: Secondary | ICD-10-CM | POA: Diagnosis not present

## 2021-07-08 DIAGNOSIS — Z510 Encounter for antineoplastic radiation therapy: Secondary | ICD-10-CM | POA: Diagnosis not present

## 2021-07-08 LAB — RAD ONC ARIA SESSION SUMMARY
Course Elapsed Days: 21
Plan Fractions Treated to Date: 16
Plan Fractions Treated to Date: 8
Plan Prescribed Dose Per Fraction: 1.8 Gy
Plan Prescribed Dose Per Fraction: 1.8 Gy
Plan Total Fractions Prescribed: 14
Plan Total Fractions Prescribed: 28
Plan Total Prescribed Dose: 25.2 Gy
Plan Total Prescribed Dose: 50.4 Gy
Reference Point Dosage Given to Date: 28.8 Gy
Reference Point Dosage Given to Date: 28.8 Gy
Reference Point Session Dosage Given: 1.8 Gy
Reference Point Session Dosage Given: 1.8 Gy
Session Number: 16

## 2021-07-09 ENCOUNTER — Other Ambulatory Visit: Payer: Self-pay

## 2021-07-09 ENCOUNTER — Ambulatory Visit
Admission: RE | Admit: 2021-07-09 | Discharge: 2021-07-09 | Disposition: A | Discharge status: Home / Self Care | Payer: BC Managed Care – PPO | Source: Ambulatory Visit | Attending: Radiation Oncology | Admitting: Radiation Oncology

## 2021-07-09 DIAGNOSIS — C50412 Malignant neoplasm of upper-outer quadrant of left female breast: Secondary | ICD-10-CM | POA: Diagnosis not present

## 2021-07-09 DIAGNOSIS — Z170 Estrogen receptor positive status [ER+]: Secondary | ICD-10-CM | POA: Diagnosis not present

## 2021-07-09 DIAGNOSIS — Z510 Encounter for antineoplastic radiation therapy: Secondary | ICD-10-CM | POA: Diagnosis not present

## 2021-07-09 LAB — RAD ONC ARIA SESSION SUMMARY
Course Elapsed Days: 22
Plan Fractions Treated to Date: 17
Plan Fractions Treated to Date: 9
Plan Prescribed Dose Per Fraction: 1.8 Gy
Plan Prescribed Dose Per Fraction: 1.8 Gy
Plan Total Fractions Prescribed: 14
Plan Total Fractions Prescribed: 28
Plan Total Prescribed Dose: 25.2 Gy
Plan Total Prescribed Dose: 50.4 Gy
Reference Point Dosage Given to Date: 30.6 Gy
Reference Point Dosage Given to Date: 30.6 Gy
Reference Point Session Dosage Given: 1.8 Gy
Reference Point Session Dosage Given: 1.8 Gy
Session Number: 17

## 2021-07-10 ENCOUNTER — Ambulatory Visit
Admission: RE | Admit: 2021-07-10 | Discharge: 2021-07-10 | Disposition: A | Discharge status: Home / Self Care | Payer: BC Managed Care – PPO | Source: Ambulatory Visit | Attending: Radiation Oncology | Admitting: Radiation Oncology

## 2021-07-10 ENCOUNTER — Other Ambulatory Visit: Payer: Self-pay

## 2021-07-10 DIAGNOSIS — Z170 Estrogen receptor positive status [ER+]: Secondary | ICD-10-CM | POA: Diagnosis not present

## 2021-07-10 DIAGNOSIS — Z510 Encounter for antineoplastic radiation therapy: Secondary | ICD-10-CM | POA: Diagnosis not present

## 2021-07-10 DIAGNOSIS — C50412 Malignant neoplasm of upper-outer quadrant of left female breast: Secondary | ICD-10-CM | POA: Diagnosis not present

## 2021-07-10 LAB — RAD ONC ARIA SESSION SUMMARY
Course Elapsed Days: 23
Plan Fractions Treated to Date: 18
Plan Fractions Treated to Date: 9
Plan Prescribed Dose Per Fraction: 1.8 Gy
Plan Prescribed Dose Per Fraction: 1.8 Gy
Plan Total Fractions Prescribed: 14
Plan Total Fractions Prescribed: 28
Plan Total Prescribed Dose: 25.2 Gy
Plan Total Prescribed Dose: 50.4 Gy
Reference Point Dosage Given to Date: 32.4 Gy
Reference Point Dosage Given to Date: 32.4 Gy
Reference Point Session Dosage Given: 1.8 Gy
Reference Point Session Dosage Given: 1.8 Gy
Session Number: 18

## 2021-07-11 ENCOUNTER — Other Ambulatory Visit: Payer: Self-pay

## 2021-07-11 ENCOUNTER — Ambulatory Visit
Admission: RE | Admit: 2021-07-11 | Discharge: 2021-07-11 | Disposition: A | Discharge status: Home / Self Care | Payer: BC Managed Care – PPO | Source: Ambulatory Visit | Attending: Radiation Oncology | Admitting: Radiation Oncology

## 2021-07-11 DIAGNOSIS — Z510 Encounter for antineoplastic radiation therapy: Secondary | ICD-10-CM | POA: Diagnosis not present

## 2021-07-11 DIAGNOSIS — Z170 Estrogen receptor positive status [ER+]: Secondary | ICD-10-CM | POA: Diagnosis not present

## 2021-07-11 DIAGNOSIS — C50412 Malignant neoplasm of upper-outer quadrant of left female breast: Secondary | ICD-10-CM | POA: Diagnosis not present

## 2021-07-11 LAB — RAD ONC ARIA SESSION SUMMARY
Course Elapsed Days: 24
Plan Fractions Treated to Date: 10
Plan Fractions Treated to Date: 19
Plan Prescribed Dose Per Fraction: 1.8 Gy
Plan Prescribed Dose Per Fraction: 1.8 Gy
Plan Total Fractions Prescribed: 14
Plan Total Fractions Prescribed: 28
Plan Total Prescribed Dose: 25.2 Gy
Plan Total Prescribed Dose: 50.4 Gy
Reference Point Dosage Given to Date: 34.2 Gy
Reference Point Dosage Given to Date: 34.2 Gy
Reference Point Session Dosage Given: 1.8 Gy
Reference Point Session Dosage Given: 1.8 Gy
Session Number: 19

## 2021-07-14 ENCOUNTER — Other Ambulatory Visit: Payer: Self-pay

## 2021-07-14 ENCOUNTER — Ambulatory Visit
Admission: RE | Admit: 2021-07-14 | Discharge: 2021-07-14 | Disposition: A | Discharge status: Home / Self Care | Payer: BC Managed Care – PPO | Source: Ambulatory Visit | Attending: Radiation Oncology | Admitting: Radiation Oncology

## 2021-07-14 DIAGNOSIS — Z510 Encounter for antineoplastic radiation therapy: Secondary | ICD-10-CM | POA: Diagnosis not present

## 2021-07-14 DIAGNOSIS — Z170 Estrogen receptor positive status [ER+]: Secondary | ICD-10-CM | POA: Diagnosis not present

## 2021-07-14 DIAGNOSIS — C50412 Malignant neoplasm of upper-outer quadrant of left female breast: Secondary | ICD-10-CM | POA: Diagnosis not present

## 2021-07-14 LAB — RAD ONC ARIA SESSION SUMMARY
Course Elapsed Days: 27
Plan Fractions Treated to Date: 10
Plan Fractions Treated to Date: 20
Plan Prescribed Dose Per Fraction: 1.8 Gy
Plan Prescribed Dose Per Fraction: 1.8 Gy
Plan Total Fractions Prescribed: 14
Plan Total Fractions Prescribed: 28
Plan Total Prescribed Dose: 25.2 Gy
Plan Total Prescribed Dose: 50.4 Gy
Reference Point Dosage Given to Date: 36 Gy
Reference Point Dosage Given to Date: 36 Gy
Reference Point Session Dosage Given: 1.8 Gy
Reference Point Session Dosage Given: 1.8 Gy
Session Number: 20

## 2021-07-15 ENCOUNTER — Ambulatory Visit
Admission: RE | Admit: 2021-07-15 | Discharge: 2021-07-15 | Disposition: A | Discharge status: Home / Self Care | Payer: BC Managed Care – PPO | Source: Ambulatory Visit | Attending: Radiation Oncology | Admitting: Radiation Oncology

## 2021-07-15 ENCOUNTER — Other Ambulatory Visit: Payer: Self-pay

## 2021-07-15 DIAGNOSIS — Z170 Estrogen receptor positive status [ER+]: Secondary | ICD-10-CM | POA: Diagnosis not present

## 2021-07-15 DIAGNOSIS — C50412 Malignant neoplasm of upper-outer quadrant of left female breast: Secondary | ICD-10-CM | POA: Diagnosis not present

## 2021-07-15 DIAGNOSIS — Z510 Encounter for antineoplastic radiation therapy: Secondary | ICD-10-CM | POA: Diagnosis not present

## 2021-07-15 LAB — RAD ONC ARIA SESSION SUMMARY
Course Elapsed Days: 28
Plan Fractions Treated to Date: 11
Plan Fractions Treated to Date: 21
Plan Prescribed Dose Per Fraction: 1.8 Gy
Plan Prescribed Dose Per Fraction: 1.8 Gy
Plan Total Fractions Prescribed: 14
Plan Total Fractions Prescribed: 28
Plan Total Prescribed Dose: 25.2 Gy
Plan Total Prescribed Dose: 50.4 Gy
Reference Point Dosage Given to Date: 37.8 Gy
Reference Point Dosage Given to Date: 37.8 Gy
Reference Point Session Dosage Given: 1.8 Gy
Reference Point Session Dosage Given: 1.8 Gy
Session Number: 21

## 2021-07-16 ENCOUNTER — Other Ambulatory Visit: Payer: Self-pay

## 2021-07-16 ENCOUNTER — Ambulatory Visit
Admission: RE | Admit: 2021-07-16 | Discharge: 2021-07-16 | Disposition: A | Discharge status: Home / Self Care | Payer: BC Managed Care – PPO | Source: Ambulatory Visit | Attending: Radiation Oncology | Admitting: Radiation Oncology

## 2021-07-16 DIAGNOSIS — Z510 Encounter for antineoplastic radiation therapy: Secondary | ICD-10-CM | POA: Diagnosis not present

## 2021-07-16 DIAGNOSIS — Z170 Estrogen receptor positive status [ER+]: Secondary | ICD-10-CM | POA: Diagnosis not present

## 2021-07-16 DIAGNOSIS — C50412 Malignant neoplasm of upper-outer quadrant of left female breast: Secondary | ICD-10-CM | POA: Diagnosis not present

## 2021-07-16 LAB — RAD ONC ARIA SESSION SUMMARY
Course Elapsed Days: 29
Plan Fractions Treated to Date: 11
Plan Fractions Treated to Date: 22
Plan Prescribed Dose Per Fraction: 1.8 Gy
Plan Prescribed Dose Per Fraction: 1.8 Gy
Plan Total Fractions Prescribed: 14
Plan Total Fractions Prescribed: 28
Plan Total Prescribed Dose: 25.2 Gy
Plan Total Prescribed Dose: 50.4 Gy
Reference Point Dosage Given to Date: 39.6 Gy
Reference Point Dosage Given to Date: 39.6 Gy
Reference Point Session Dosage Given: 1.8 Gy
Reference Point Session Dosage Given: 1.8 Gy
Session Number: 22

## 2021-07-17 ENCOUNTER — Other Ambulatory Visit: Payer: Self-pay

## 2021-07-17 ENCOUNTER — Ambulatory Visit
Admission: RE | Admit: 2021-07-17 | Discharge: 2021-07-17 | Disposition: A | Discharge status: Home / Self Care | Payer: BC Managed Care – PPO | Source: Ambulatory Visit | Attending: Radiation Oncology | Admitting: Radiation Oncology

## 2021-07-17 DIAGNOSIS — Z170 Estrogen receptor positive status [ER+]: Secondary | ICD-10-CM | POA: Diagnosis not present

## 2021-07-17 DIAGNOSIS — C50412 Malignant neoplasm of upper-outer quadrant of left female breast: Secondary | ICD-10-CM | POA: Diagnosis not present

## 2021-07-17 DIAGNOSIS — Z510 Encounter for antineoplastic radiation therapy: Secondary | ICD-10-CM | POA: Diagnosis not present

## 2021-07-17 LAB — RAD ONC ARIA SESSION SUMMARY
Course Elapsed Days: 30
Plan Fractions Treated to Date: 12
Plan Fractions Treated to Date: 23
Plan Prescribed Dose Per Fraction: 1.8 Gy
Plan Prescribed Dose Per Fraction: 1.8 Gy
Plan Total Fractions Prescribed: 14
Plan Total Fractions Prescribed: 28
Plan Total Prescribed Dose: 25.2 Gy
Plan Total Prescribed Dose: 50.4 Gy
Reference Point Dosage Given to Date: 41.4 Gy
Reference Point Dosage Given to Date: 41.4 Gy
Reference Point Session Dosage Given: 1.8 Gy
Reference Point Session Dosage Given: 1.8 Gy
Session Number: 23

## 2021-07-18 ENCOUNTER — Other Ambulatory Visit: Payer: Self-pay

## 2021-07-18 ENCOUNTER — Ambulatory Visit
Admission: RE | Admit: 2021-07-18 | Discharge: 2021-07-18 | Disposition: A | Discharge status: Home / Self Care | Payer: BC Managed Care – PPO | Source: Ambulatory Visit | Attending: Radiation Oncology | Admitting: Radiation Oncology

## 2021-07-18 DIAGNOSIS — Z510 Encounter for antineoplastic radiation therapy: Secondary | ICD-10-CM | POA: Diagnosis not present

## 2021-07-18 DIAGNOSIS — C50412 Malignant neoplasm of upper-outer quadrant of left female breast: Secondary | ICD-10-CM | POA: Diagnosis not present

## 2021-07-18 DIAGNOSIS — Z170 Estrogen receptor positive status [ER+]: Secondary | ICD-10-CM | POA: Diagnosis not present

## 2021-07-18 LAB — RAD ONC ARIA SESSION SUMMARY
Course Elapsed Days: 31
Plan Fractions Treated to Date: 12
Plan Fractions Treated to Date: 24
Plan Prescribed Dose Per Fraction: 1.8 Gy
Plan Prescribed Dose Per Fraction: 1.8 Gy
Plan Total Fractions Prescribed: 14
Plan Total Fractions Prescribed: 28
Plan Total Prescribed Dose: 25.2 Gy
Plan Total Prescribed Dose: 50.4 Gy
Reference Point Dosage Given to Date: 43.2 Gy
Reference Point Dosage Given to Date: 43.2 Gy
Reference Point Session Dosage Given: 1.8 Gy
Reference Point Session Dosage Given: 1.8 Gy
Session Number: 24

## 2021-07-21 ENCOUNTER — Encounter: Payer: Self-pay | Admitting: Hematology

## 2021-07-21 ENCOUNTER — Other Ambulatory Visit: Payer: Self-pay

## 2021-07-21 ENCOUNTER — Ambulatory Visit
Admission: RE | Admit: 2021-07-21 | Discharge: 2021-07-21 | Disposition: A | Discharge status: Home / Self Care | Payer: BC Managed Care – PPO | Source: Ambulatory Visit | Attending: Radiation Oncology | Admitting: Radiation Oncology

## 2021-07-21 ENCOUNTER — Inpatient Hospital Stay (HOSPITAL_BASED_OUTPATIENT_CLINIC_OR_DEPARTMENT_OTHER): Payer: BC Managed Care – PPO | Admitting: Hematology

## 2021-07-21 VITALS — BP 125/64 | HR 78 | Temp 98.7°F | Resp 18 | Ht 64.0 in | Wt 187.6 lb

## 2021-07-21 DIAGNOSIS — N83202 Unspecified ovarian cyst, left side: Secondary | ICD-10-CM | POA: Insufficient documentation

## 2021-07-21 DIAGNOSIS — C50412 Malignant neoplasm of upper-outer quadrant of left female breast: Secondary | ICD-10-CM | POA: Insufficient documentation

## 2021-07-21 DIAGNOSIS — Z79811 Long term (current) use of aromatase inhibitors: Secondary | ICD-10-CM | POA: Insufficient documentation

## 2021-07-21 DIAGNOSIS — Z79899 Other long term (current) drug therapy: Secondary | ICD-10-CM | POA: Insufficient documentation

## 2021-07-21 DIAGNOSIS — G35 Multiple sclerosis: Secondary | ICD-10-CM | POA: Insufficient documentation

## 2021-07-21 DIAGNOSIS — Z90722 Acquired absence of ovaries, bilateral: Secondary | ICD-10-CM | POA: Insufficient documentation

## 2021-07-21 DIAGNOSIS — N6012 Diffuse cystic mastopathy of left breast: Secondary | ICD-10-CM | POA: Insufficient documentation

## 2021-07-21 DIAGNOSIS — N838 Other noninflammatory disorders of ovary, fallopian tube and broad ligament: Secondary | ICD-10-CM | POA: Insufficient documentation

## 2021-07-21 DIAGNOSIS — N83201 Unspecified ovarian cyst, right side: Secondary | ICD-10-CM | POA: Insufficient documentation

## 2021-07-21 DIAGNOSIS — Z808 Family history of malignant neoplasm of other organs or systems: Secondary | ICD-10-CM | POA: Insufficient documentation

## 2021-07-21 DIAGNOSIS — Z87442 Personal history of urinary calculi: Secondary | ICD-10-CM | POA: Insufficient documentation

## 2021-07-21 DIAGNOSIS — N6489 Other specified disorders of breast: Secondary | ICD-10-CM | POA: Insufficient documentation

## 2021-07-21 DIAGNOSIS — Z888 Allergy status to other drugs, medicaments and biological substances status: Secondary | ICD-10-CM | POA: Insufficient documentation

## 2021-07-21 DIAGNOSIS — K769 Liver disease, unspecified: Secondary | ICD-10-CM | POA: Diagnosis not present

## 2021-07-21 DIAGNOSIS — Z510 Encounter for antineoplastic radiation therapy: Secondary | ICD-10-CM | POA: Diagnosis not present

## 2021-07-21 DIAGNOSIS — Z170 Estrogen receptor positive status [ER+]: Secondary | ICD-10-CM

## 2021-07-21 DIAGNOSIS — N6310 Unspecified lump in the right breast, unspecified quadrant: Secondary | ICD-10-CM | POA: Insufficient documentation

## 2021-07-21 LAB — RAD ONC ARIA SESSION SUMMARY
Course Elapsed Days: 34
Plan Fractions Treated to Date: 13
Plan Fractions Treated to Date: 25
Plan Prescribed Dose Per Fraction: 1.8 Gy
Plan Prescribed Dose Per Fraction: 1.8 Gy
Plan Total Fractions Prescribed: 14
Plan Total Fractions Prescribed: 28
Plan Total Prescribed Dose: 25.2 Gy
Plan Total Prescribed Dose: 50.4 Gy
Reference Point Dosage Given to Date: 45 Gy
Reference Point Dosage Given to Date: 45 Gy
Reference Point Session Dosage Given: 1.8 Gy
Reference Point Session Dosage Given: 1.8 Gy
Session Number: 25

## 2021-07-21 MED ORDER — LETROZOLE 2.5 MG PO TABS
2.5000 mg | ORAL_TABLET | Freq: Every day | ORAL | 3 refills | Status: DC
Start: 2021-07-21 — End: 2021-12-18

## 2021-07-21 NOTE — Progress Notes (Signed)
?[place]   ?[phone] [phone]   ?Clinic Follow up Note  ? ?Patient Care Team: ?Sueanne Margarita, DO as PCP - General (Internal Medicine) ?Stark Klein, MD as Consulting Physician (General Surgery) ?[name], MD as Consulting Physician (Hematology) ?Mauro Kaufmann, RN as Registered Nurse ?Rockwell Germany, RN as Registered Nurse ? ?Date of Service:  07/21/2021 ? ?CHIEF COMPLAINT: f/u of left breast cancer ? ?CURRENT THERAPY:  ?Adjuvant post-mastectomy radiation, 06/17/21 - 07/31/21 ? ?ASSESSMENT & PLAN:  ?Diana Weaver is a 46 y.o. post-BSO female with  ? ?1. Malignant neoplasm of upper-outer quadrant of left breast, invasive ductal carcinoma, Stage IB, mpT1c, N1a, cM0, ER+/PR+/HER2-, Grade 2 , MammaPrint low risk ?-found on screening mammogram. Mammaprint on her biopsy showed low risk. I started her on neoadjuvant tamoxifen on 02/21/21 in anticipation of surgical delays.  ?-she opted to proceed with bilateral mastectomies and BSO on 03/27/21. 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 ?-Her MammaPrint showed low risk, no benefit for adjuvant chemotherapy.  ?--Given the strong ER and PR positivity, I do recommend adjuvant aromatase inhibitor to reduce her risk of cancer recurrence. In her young age and post-BSO status, I recommend letrozole. The potential benefit and side effects, which includes but not limited to, hot flash, skin and vaginal dryness, metabolic changes ( increased blood glucose, cholesterol, weight, etc.), slightly in increased risk of cardiovascular disease, cataracts, muscular and joint discomfort, osteopenia and osteoporosis, etc, were discussed with her in great details. We discussed management of hot flashes and mood swings with antidepressant medication, such as Effexor. She is interested, and we'll start after she completes radiation, in a few weeks ?-We also discussed breast cancer surveillance, she will  follow-up with [place] routinely, no need for mammogram. ?  ?2. 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 ?-genetic counseling and testing on 02/05/21, results were negative. ? ?3. 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  ?-f/u MRI recommended in 6 months. ?  ?  ?PLAN:  ?-continue daily radiation through 07/31/21 ?-start letrozole a few weeks after completing radiation ?-survivorship in 3 months ?-f/u in 6 months, with lab and MRI several days before ? ? ?No problem-specific Assessment & Plan notes found for this encounter. ? ? ?SUMMARY OF ONCOLOGIC HISTORY: ?Oncology History Overview Note  ? Cancer Staging  ?Malignant neoplasm of upper-outer quadrant of left breast in female, estrogen receptor positive ([place]) ?Staging form: Breast, AJCC 8th Edition ?- Clinical stage from 01/21/2021: Stage IB (cT1c, cN1, cM0, G2, ER+, PR+, HER2-) - Signed by [name], MD on 02/04/2021 ?- Pathologic stage from 04/21/2021: Stage IA (pT1c, pN1(sn), cM0, G2, ER+, PR+, HER2-) - Signed by [name], PA-C on 04/21/2021  ? ?  ?Malignant neoplasm of upper-outer quadrant of left breast in female, estrogen receptor positive ([place])  ?01/14/2021 Mammogram  ? EXAM: ?DIGITAL DIAGNOSTIC BILATERAL MAMMOGRAM WITH TOMOSYNTHESIS AND CAD; ?ULTRASOUND LEFT BREAST LIMITED ? ?IMPRESSION: ?1. Suspicious left breast mass at the 2:30 position 9 cm from the ?nipple corresponding with the screening mammographic findings. ?Recommendation is for ultrasound-guided biopsy. ?2. Single, morphologically abnormal left axillary lymph node. ?Recommendation is for ultrasound-guided biopsy. ?3. No persistent, suspicious findings in the right breast. ?  ?01/21/2021 Cancer Staging  ? Staging form: Breast, AJCC  8th Edition ?- Clinical stage from 01/21/2021: Stage IB (cT1c,  cN1, cM0, G2, ER+, PR+, HER2-) - Signed by [name], MD on 02/04/2021 ?Stage prefix: Initial diagnosis ?Histologic grading system: 3 grade system ? ?  ?01/21/2021 Pathology Results  ? Diagnosis ?1. Breast, left, needle core biopsy, 2:30 o'clock, ribbon, 9cmfn ?- INVASIVE DUCTAL CARCINOMA ?- DUCTAL CARCINOMA IN SITU ?- SEE COMMENT ?2. Lymph node, needle/core biopsy, left axillary node, tribell ?- METASTATIC CARCINOMA INVOLVING NODAL TISSUE ?Microscopic Comment ?1. Based on the biopsy, the carcinoma appears Nottingham grade 2 of 3 and measures 1.1 cm in greatest linear extent. ? ?1. PROGNOSTIC INDICATORS ?Results: ?The tumor cells are EQUIVOCAL for Her2 (2+). Her2 by FISH will be performed and results reported separately. ?Estrogen Receptor: 100%, POSITIVE, STRONG STAINING INTENSITY ?Progesterone Receptor: 100%, POSITIVE, STRONG STAINING INTENSITY ?Proliferation Marker Ki67: 15% ?1. FLUORESCENCE IN-SITU HYBRIDIZATION ?Results: ?GROUP 5: HER2 **NEGATIVE** ? ?2. PROGNOSTIC INDICATORS ?Results: ?The tumor cells are EQUIVOCAL for Her2 (2+). Her2 by FISH will be performed and results reported separately ?Estrogen Receptor: 100%, POSITIVE, STRONG STAINING INTENSITY ?Progesterone Receptor: 100%, POSITIVE, STRONG STAINING INTENSITY ?2. FLUORESCENCE IN-SITU HYBRIDIZATION ?Results: ?GROUP 5: HER2 **NEGATIVE** ?  ?01/21/2021 Miscellaneous  ? Mammaprint: LOW RISK ?  ?02/04/2021 Initial Diagnosis  ? Malignant neoplasm of upper-outer quadrant of left breast in female, estrogen receptor positive ([place]) ? ?  ?02/04/2021 Imaging  ? EXAM: ?BILATERAL BREAST MRI WITH AND WITHOUT CONTRAST ? ?IMPRESSION: ?1. Known malignancy in the LATERAL portion of the LEFT breast ?measures 3.0 centimeters on MRI. ?2. Single enlarged LEFT axillary lymph node represents the recently biopsied node with metastatic carcinoma. ?3. Two additional suspicious masses in the UPPER central LEFT breast warranting tissue diagnosis. Given the very heterogeneous  appearance of the LEFT breast, with numerous cysts, MR guided core biopsy is indicated for optimal targeting. ?4. RIGHT breast is negative. ?  ?02/13/2021 Pathology Results  ? Diagnosis ?1. Breast, left, needle core biopsy, upper outer quadrant(anterior lesion) ?- FOCAL INTRAVASCULAR TUMOR. ?- DUCTAL PAPILLOMA. ?- COLUMNAR CELL CHANGE WITH CALCIFICATIONS. ?- PSEUDOANGIOMATOUS STROMAL HYPERPLASIA ([place]). ?- SEE MICROSCOPIC DESCRIPTION. ?2. Breast, left, needle core biopsy, upper outer quadrant(posterior lesion) ?- INVASIVE AND IN SITU DUCTAL CARCINOMA, GRADE 2. ?- SEE MICROSCOPIC DESCRIPTION. ?  ?02/17/2021 Genetic Testing  ? Negative hereditary cancer genetic testing: no pathogenic variants detected in [facility] CancerNext-Expanded +RNAinsight Panel.  The report date is 02/17/2021.   ? ?The CancerNext-Expanded gene panel offered by [facility] and includes sequencing, rearrangement, and RNA analysis for the following 77 genes: AIP, ALK, APC, ATM, AXIN2, BAP1, BARD1, BLM, BMPR1A, BRCA1, BRCA2, BRIP1, CDC73, CDH1, CDK4, CDKN1B, CDKN2A, CHEK2, CTNNA1, DICER1, FANCC, FH, FLCN, GALNT12, KIF1B, LZTR1, MAX, MEN1, MET, MLH1, MSH2, MSH3, MSH6, MUTYH, NBN, NF1, NF2, NTHL1, PALB2, PHOX2B, PMS2, POT1, PRKAR1A, PTCH1, PTEN, RAD51C, RAD51D, RB1, RECQL, RET, SDHA, SDHAF2, SDHB, SDHC, SDHD, SMAD4, SMARCA4, SMARCB1, SMARCE1, STK11, SUFU, TMEM127, TP53, TSC1, TSC2, VHL and XRCC2 (sequencing and deletion/duplication); EGFR, EGLN1, HOXB13, KIT, MITF, PDGFRA, POLD1, and POLE (sequencing only); EPCAM and GREM1 (deletion/duplication only).  ?  ?04/21/2021 Cancer Staging  ? Staging form: Breast, AJCC 8th Edition ?- Pathologic stage from 04/21/2021: Stage IA (pT1c, pN1(sn), cM0, G2, ER+, PR+, HER2-) - Signed by [name], PA-C on 04/21/2021 ?Stage prefix: Initial diagnosis ?Method of lymph node assessment: Sentinel lymph node biopsy ?Multigene prognostic tests performed: MammaPrint ?Histologic grading system: 3 grade system ? ?  ?  Definitive Surgery  ? FINAL MICROSCOPIC DIAGNOSIS:  ? ?A. OVARY AND FALLOPIAN TUBE, RIGHT:  ?-  Benign ovary  ?-  Benign fallopian tube with paratubal cyst(s)  ?-  No malignancy identified  ? ?B. OVARY AND FA

## 2021-07-22 ENCOUNTER — Other Ambulatory Visit: Payer: Self-pay

## 2021-07-22 ENCOUNTER — Ambulatory Visit
Admission: RE | Admit: 2021-07-22 | Discharge: 2021-07-22 | Disposition: A | Discharge status: Home / Self Care | Payer: BC Managed Care – PPO | Source: Ambulatory Visit | Attending: Radiation Oncology | Admitting: Radiation Oncology

## 2021-07-22 DIAGNOSIS — Z170 Estrogen receptor positive status [ER+]: Secondary | ICD-10-CM | POA: Diagnosis not present

## 2021-07-22 DIAGNOSIS — C50412 Malignant neoplasm of upper-outer quadrant of left female breast: Secondary | ICD-10-CM | POA: Diagnosis not present

## 2021-07-22 DIAGNOSIS — Z510 Encounter for antineoplastic radiation therapy: Secondary | ICD-10-CM | POA: Diagnosis not present

## 2021-07-22 LAB — RAD ONC ARIA SESSION SUMMARY
Course Elapsed Days: 35
Plan Fractions Treated to Date: 13
Plan Fractions Treated to Date: 26
Plan Prescribed Dose Per Fraction: 1.8 Gy
Plan Prescribed Dose Per Fraction: 1.8 Gy
Plan Total Fractions Prescribed: 14
Plan Total Fractions Prescribed: 28
Plan Total Prescribed Dose: 25.2 Gy
Plan Total Prescribed Dose: 50.4 Gy
Reference Point Dosage Given to Date: 46.8 Gy
Reference Point Dosage Given to Date: 46.8 Gy
Reference Point Session Dosage Given: 1.8 Gy
Reference Point Session Dosage Given: 1.8 Gy
Session Number: 26

## 2021-07-23 ENCOUNTER — Ambulatory Visit
Admission: RE | Admit: 2021-07-23 | Discharge: 2021-07-23 | Disposition: A | Discharge status: Home / Self Care | Payer: BC Managed Care – PPO | Source: Ambulatory Visit | Attending: Radiation Oncology | Admitting: Radiation Oncology

## 2021-07-23 ENCOUNTER — Other Ambulatory Visit: Payer: Self-pay

## 2021-07-23 DIAGNOSIS — Z510 Encounter for antineoplastic radiation therapy: Secondary | ICD-10-CM | POA: Diagnosis not present

## 2021-07-23 DIAGNOSIS — C50412 Malignant neoplasm of upper-outer quadrant of left female breast: Secondary | ICD-10-CM | POA: Diagnosis not present

## 2021-07-23 DIAGNOSIS — Z170 Estrogen receptor positive status [ER+]: Secondary | ICD-10-CM | POA: Diagnosis not present

## 2021-07-23 LAB — RAD ONC ARIA SESSION SUMMARY
Course Elapsed Days: 36
Plan Fractions Treated to Date: 14
Plan Fractions Treated to Date: 27
Plan Prescribed Dose Per Fraction: 1.8 Gy
Plan Prescribed Dose Per Fraction: 1.8 Gy
Plan Total Fractions Prescribed: 14
Plan Total Fractions Prescribed: 28
Plan Total Prescribed Dose: 25.2 Gy
Plan Total Prescribed Dose: 50.4 Gy
Reference Point Dosage Given to Date: 48.6 Gy
Reference Point Dosage Given to Date: 48.6 Gy
Reference Point Session Dosage Given: 1.8 Gy
Reference Point Session Dosage Given: 1.8 Gy
Session Number: 27

## 2021-07-24 ENCOUNTER — Telehealth: Payer: Self-pay | Admitting: Hematology

## 2021-07-24 ENCOUNTER — Ambulatory Visit
Admission: RE | Admit: 2021-07-24 | Discharge: 2021-07-24 | Disposition: A | Discharge status: Home / Self Care | Payer: BC Managed Care – PPO | Source: Ambulatory Visit | Attending: Radiation Oncology | Admitting: Radiation Oncology

## 2021-07-24 ENCOUNTER — Other Ambulatory Visit: Payer: Self-pay

## 2021-07-24 DIAGNOSIS — Z510 Encounter for antineoplastic radiation therapy: Secondary | ICD-10-CM | POA: Diagnosis not present

## 2021-07-24 DIAGNOSIS — C50412 Malignant neoplasm of upper-outer quadrant of left female breast: Secondary | ICD-10-CM | POA: Diagnosis not present

## 2021-07-24 DIAGNOSIS — Z170 Estrogen receptor positive status [ER+]: Secondary | ICD-10-CM | POA: Diagnosis not present

## 2021-07-24 LAB — RAD ONC ARIA SESSION SUMMARY
Course Elapsed Days: 37
Plan Fractions Treated to Date: 14
Plan Fractions Treated to Date: 28
Plan Prescribed Dose Per Fraction: 1.8 Gy
Plan Prescribed Dose Per Fraction: 1.8 Gy
Plan Total Fractions Prescribed: 14
Plan Total Fractions Prescribed: 28
Plan Total Prescribed Dose: 25.2 Gy
Plan Total Prescribed Dose: 50.4 Gy
Reference Point Dosage Given to Date: 50.4 Gy
Reference Point Dosage Given to Date: 50.4 Gy
Reference Point Session Dosage Given: 1.8 Gy
Reference Point Session Dosage Given: 1.8 Gy
Session Number: 28

## 2021-07-24 NOTE — Telephone Encounter (Signed)
Scheduled follow-up appointments per 5/15 los. Patient is aware. ?

## 2021-07-25 ENCOUNTER — Other Ambulatory Visit: Payer: Self-pay

## 2021-07-25 ENCOUNTER — Ambulatory Visit
Admission: RE | Admit: 2021-07-25 | Discharge: 2021-07-25 | Disposition: A | Discharge status: Home / Self Care | Payer: BC Managed Care – PPO | Source: Ambulatory Visit | Attending: Radiation Oncology | Admitting: Radiation Oncology

## 2021-07-25 DIAGNOSIS — C50412 Malignant neoplasm of upper-outer quadrant of left female breast: Secondary | ICD-10-CM

## 2021-07-25 DIAGNOSIS — Z170 Estrogen receptor positive status [ER+]: Secondary | ICD-10-CM | POA: Diagnosis not present

## 2021-07-25 LAB — RAD ONC ARIA SESSION SUMMARY
Course Elapsed Days: 38
Plan Fractions Treated to Date: 1
Plan Prescribed Dose Per Fraction: 2 Gy
Plan Total Fractions Prescribed: 5
Plan Total Prescribed Dose: 10 Gy
Reference Point Dosage Given to Date: 52.4 Gy
Reference Point Session Dosage Given: 2 Gy
Session Number: 29

## 2021-07-25 MED ORDER — RADIAPLEXRX EX GEL: Freq: Once | CUTANEOUS | Status: AC

## 2021-07-28 ENCOUNTER — Other Ambulatory Visit: Payer: Self-pay

## 2021-07-28 ENCOUNTER — Ambulatory Visit
Admission: RE | Admit: 2021-07-28 | Discharge: 2021-07-28 | Disposition: A | Discharge status: Home / Self Care | Payer: BC Managed Care – PPO | Source: Ambulatory Visit | Attending: Radiation Oncology | Admitting: Radiation Oncology

## 2021-07-28 DIAGNOSIS — Z170 Estrogen receptor positive status [ER+]: Secondary | ICD-10-CM | POA: Diagnosis not present

## 2021-07-28 DIAGNOSIS — C50412 Malignant neoplasm of upper-outer quadrant of left female breast: Secondary | ICD-10-CM | POA: Diagnosis not present

## 2021-07-28 DIAGNOSIS — Z510 Encounter for antineoplastic radiation therapy: Secondary | ICD-10-CM | POA: Diagnosis not present

## 2021-07-28 LAB — RAD ONC ARIA SESSION SUMMARY
Course Elapsed Days: 41
Plan Fractions Treated to Date: 2
Plan Prescribed Dose Per Fraction: 2 Gy
Plan Total Fractions Prescribed: 5
Plan Total Prescribed Dose: 10 Gy
Reference Point Dosage Given to Date: 54.4 Gy
Reference Point Session Dosage Given: 2 Gy
Session Number: 30

## 2021-07-29 ENCOUNTER — Ambulatory Visit
Admission: RE | Admit: 2021-07-29 | Discharge: 2021-07-29 | Disposition: A | Discharge status: Home / Self Care | Payer: BC Managed Care – PPO | Source: Ambulatory Visit | Attending: Radiation Oncology | Admitting: Radiation Oncology

## 2021-07-29 ENCOUNTER — Other Ambulatory Visit: Payer: Self-pay

## 2021-07-29 DIAGNOSIS — C50412 Malignant neoplasm of upper-outer quadrant of left female breast: Secondary | ICD-10-CM | POA: Diagnosis not present

## 2021-07-29 DIAGNOSIS — Z170 Estrogen receptor positive status [ER+]: Secondary | ICD-10-CM | POA: Diagnosis not present

## 2021-07-29 LAB — RAD ONC ARIA SESSION SUMMARY
Course Elapsed Days: 42
Plan Fractions Treated to Date: 3
Plan Prescribed Dose Per Fraction: 2 Gy
Plan Total Fractions Prescribed: 5
Plan Total Prescribed Dose: 10 Gy
Reference Point Dosage Given to Date: 56.4 Gy
Reference Point Session Dosage Given: 2 Gy
Session Number: 31

## 2021-07-30 ENCOUNTER — Other Ambulatory Visit: Payer: Self-pay

## 2021-07-30 ENCOUNTER — Ambulatory Visit
Admission: RE | Admit: 2021-07-30 | Discharge: 2021-07-30 | Disposition: A | Discharge status: Home / Self Care | Payer: BC Managed Care – PPO | Source: Ambulatory Visit | Attending: Radiation Oncology | Admitting: Radiation Oncology

## 2021-07-30 DIAGNOSIS — Z170 Estrogen receptor positive status [ER+]: Secondary | ICD-10-CM | POA: Diagnosis not present

## 2021-07-30 DIAGNOSIS — C50412 Malignant neoplasm of upper-outer quadrant of left female breast: Secondary | ICD-10-CM | POA: Diagnosis not present

## 2021-07-30 LAB — RAD ONC ARIA SESSION SUMMARY
Course Elapsed Days: 43
Plan Fractions Treated to Date: 4
Plan Prescribed Dose Per Fraction: 2 Gy
Plan Total Fractions Prescribed: 5
Plan Total Prescribed Dose: 10 Gy
Reference Point Dosage Given to Date: 58.4 Gy
Reference Point Session Dosage Given: 2 Gy
Session Number: 32

## 2021-07-31 ENCOUNTER — Other Ambulatory Visit: Payer: Self-pay | Admitting: *Deleted

## 2021-07-31 ENCOUNTER — Encounter: Payer: Self-pay | Admitting: Radiation Oncology

## 2021-07-31 ENCOUNTER — Ambulatory Visit
Admission: RE | Admit: 2021-07-31 | Discharge: 2021-07-31 | Disposition: A | Discharge status: Home / Self Care | Payer: BC Managed Care – PPO | Source: Ambulatory Visit | Attending: Radiation Oncology | Admitting: Radiation Oncology

## 2021-07-31 ENCOUNTER — Other Ambulatory Visit: Payer: Self-pay

## 2021-07-31 ENCOUNTER — Encounter: Payer: Self-pay | Admitting: *Deleted

## 2021-07-31 DIAGNOSIS — C50412 Malignant neoplasm of upper-outer quadrant of left female breast: Secondary | ICD-10-CM | POA: Diagnosis not present

## 2021-07-31 DIAGNOSIS — Z170 Estrogen receptor positive status [ER+]: Secondary | ICD-10-CM | POA: Diagnosis not present

## 2021-07-31 DIAGNOSIS — Z510 Encounter for antineoplastic radiation therapy: Secondary | ICD-10-CM | POA: Diagnosis not present

## 2021-07-31 LAB — RAD ONC ARIA SESSION SUMMARY
Course Elapsed Days: 44
Plan Fractions Treated to Date: 5
Plan Prescribed Dose Per Fraction: 2 Gy
Plan Total Fractions Prescribed: 5
Plan Total Prescribed Dose: 10 Gy
Reference Point Dosage Given to Date: 60.4 Gy
Reference Point Session Dosage Given: 2 Gy
Session Number: 33

## 2021-07-31 NOTE — Progress Notes (Addendum)
[facility]                                                                                                                                                             Patient Name: Diana Weaver MRN: [id number] DOB: May 12, 1975 Referring Physician: Sueanne Margarita Date of Service: 07/31/2021 [facility], [place]                                                        End Of Treatment Note  Diagnoses: C50.412-Malignant neoplasm of upper-outer quadrant of left female breast  Cancer Staging: Stage IA, pT1cN1M0 grade 2, ER/PR positive invasive ductal carcinoma of the left breast  Intent: Curative  Radiation Treatment Dates: 06/17/2021 through 07/31/2021 Site Technique Total Dose (Gy) Dose per Fx (Gy) Completed Fx Beam Energies  Chest Wall, Left: CW_L 3D 50.4/50.4 1.8 28/28 10X  Chest Wall, Left: CW_L_SCLV 3D 50.4/50.4 1.8 28/28 6X, 10X  Chest Wall, Left: CW_L_Bst Electron 10/10 2 5/5 9E   Narrative: The patient tolerated radiation therapy relatively well. She developed fatigue and anticipated skin changes in the treatment field. A very small area of skin breakdown was seen in the incision line without evidence of infection.   Plan: The patient will receive a call in about one month from the radiation oncology department. She will continue follow up with Dr. Burr Medico as well.  ______________________________________________    Carola Rhine, [facility]

## 2021-08-05 ENCOUNTER — Encounter: Payer: Self-pay | Admitting: Hematology

## 2021-10-10 ENCOUNTER — Encounter: Payer: Self-pay | Admitting: *Deleted

## 2021-10-17 ENCOUNTER — Encounter: Payer: Self-pay | Admitting: Adult Health

## 2021-10-17 ENCOUNTER — Other Ambulatory Visit: Payer: Self-pay

## 2021-10-17 ENCOUNTER — Inpatient Hospital Stay: Payer: BC Managed Care – PPO | Attending: Radiation Oncology | Admitting: Adult Health

## 2021-10-17 VITALS — BP 130/81 | HR 111 | Temp 97.8°F | Resp 18 | Ht 64.0 in | Wt 187.7 lb

## 2021-10-17 DIAGNOSIS — C773 Secondary and unspecified malignant neoplasm of axilla and upper limb lymph nodes: Secondary | ICD-10-CM | POA: Insufficient documentation

## 2021-10-17 DIAGNOSIS — Z8349 Family history of other endocrine, nutritional and metabolic diseases: Secondary | ICD-10-CM | POA: Diagnosis not present

## 2021-10-17 DIAGNOSIS — Z811 Family history of alcohol abuse and dependence: Secondary | ICD-10-CM | POA: Insufficient documentation

## 2021-10-17 DIAGNOSIS — N6489 Other specified disorders of breast: Secondary | ICD-10-CM | POA: Insufficient documentation

## 2021-10-17 DIAGNOSIS — Z8249 Family history of ischemic heart disease and other diseases of the circulatory system: Secondary | ICD-10-CM | POA: Diagnosis not present

## 2021-10-17 DIAGNOSIS — Z170 Estrogen receptor positive status [ER+]: Secondary | ICD-10-CM | POA: Diagnosis not present

## 2021-10-17 DIAGNOSIS — E2839 Other primary ovarian failure: Secondary | ICD-10-CM | POA: Diagnosis not present

## 2021-10-17 DIAGNOSIS — Z87891 Personal history of nicotine dependence: Secondary | ICD-10-CM | POA: Insufficient documentation

## 2021-10-17 DIAGNOSIS — Z808 Family history of malignant neoplasm of other organs or systems: Secondary | ICD-10-CM | POA: Insufficient documentation

## 2021-10-17 DIAGNOSIS — N838 Other noninflammatory disorders of ovary, fallopian tube and broad ligament: Secondary | ICD-10-CM | POA: Diagnosis not present

## 2021-10-17 DIAGNOSIS — Z79899 Other long term (current) drug therapy: Secondary | ICD-10-CM | POA: Diagnosis not present

## 2021-10-17 DIAGNOSIS — C50412 Malignant neoplasm of upper-outer quadrant of left female breast: Secondary | ICD-10-CM | POA: Diagnosis not present

## 2021-10-17 DIAGNOSIS — R232 Flushing: Secondary | ICD-10-CM | POA: Diagnosis not present

## 2021-10-17 DIAGNOSIS — N6310 Unspecified lump in the right breast, unspecified quadrant: Secondary | ICD-10-CM | POA: Insufficient documentation

## 2021-10-17 DIAGNOSIS — Z833 Family history of diabetes mellitus: Secondary | ICD-10-CM | POA: Insufficient documentation

## 2021-10-17 DIAGNOSIS — Z888 Allergy status to other drugs, medicaments and biological substances status: Secondary | ICD-10-CM | POA: Insufficient documentation

## 2021-10-17 DIAGNOSIS — Z836 Family history of other diseases of the respiratory system: Secondary | ICD-10-CM | POA: Diagnosis not present

## 2021-10-17 DIAGNOSIS — Z79811 Long term (current) use of aromatase inhibitors: Secondary | ICD-10-CM | POA: Insufficient documentation

## 2021-10-17 DIAGNOSIS — Z90721 Acquired absence of ovaries, unilateral: Secondary | ICD-10-CM | POA: Diagnosis not present

## 2021-10-17 DIAGNOSIS — Z841 Family history of disorders of kidney and ureter: Secondary | ICD-10-CM | POA: Diagnosis not present

## 2021-10-17 DIAGNOSIS — Z818 Family history of other mental and behavioral disorders: Secondary | ICD-10-CM | POA: Insufficient documentation

## 2021-10-17 DIAGNOSIS — Z87442 Personal history of urinary calculi: Secondary | ICD-10-CM | POA: Diagnosis not present

## 2021-10-17 DIAGNOSIS — Z823 Family history of stroke: Secondary | ICD-10-CM | POA: Insufficient documentation

## 2021-10-17 NOTE — Progress Notes (Signed)
SURVIVORSHIP VISIT:    BRIEF ONCOLOGIC HISTORY:  Oncology History Overview Note   Cancer Staging  Malignant neoplasm of upper-outer quadrant of left breast in female, estrogen receptor positive ([place]) Staging form: Breast, AJCC 8th Edition - Clinical stage from 01/21/2021: Stage IB (cT1c, cN1, cM0, G2, ER+, PR+, HER2-) - Signed by [name], MD on 02/04/2021 - Pathologic stage from 04/21/2021: Stage IA (pT1c, pN1(sn), cM0, G2, ER+, PR+, HER2-) - Signed by [name], PA-C on 04/21/2021     Malignant neoplasm of upper-outer quadrant of left breast in female, estrogen receptor positive ([place])  01/14/2021 Mammogram   EXAM: DIGITAL DIAGNOSTIC BILATERAL MAMMOGRAM WITH TOMOSYNTHESIS AND CAD; ULTRASOUND LEFT BREAST LIMITED  IMPRESSION: 1. Suspicious left breast mass at the 2:30 position 9 cm from the nipple corresponding with the screening mammographic findings. Recommendation is for ultrasound-guided biopsy. 2. Single, morphologically abnormal left axillary lymph node. Recommendation is for ultrasound-guided biopsy. 3. No persistent, suspicious findings in the right breast.   01/21/2021 Cancer Staging   Staging form: Breast, AJCC 8th Edition - Clinical stage from 01/21/2021: Stage IB (cT1c, cN1, cM0, G2, ER+, PR+, HER2-) - Signed by [name], MD on 02/04/2021 Stage prefix: Initial diagnosis Histologic grading system: 3 grade system   01/21/2021 Pathology Results   Diagnosis 1. Breast, left, needle core biopsy, 2:30 o'clock, ribbon, 9cmfn - INVASIVE DUCTAL CARCINOMA - DUCTAL CARCINOMA IN SITU - SEE COMMENT 2. Lymph node, needle/core biopsy, left axillary node, tribell - METASTATIC CARCINOMA INVOLVING NODAL TISSUE Microscopic Comment 1. Based on the biopsy, the carcinoma appears Nottingham grade 2 of 3 and measures 1.1 cm in greatest linear extent.  1. PROGNOSTIC INDICATORS Results: The tumor cells are EQUIVOCAL for Her2 (2+). Her2 by FISH will be performed and results  reported separately. Estrogen Receptor: 100%, POSITIVE, STRONG STAINING INTENSITY Progesterone Receptor: 100%, POSITIVE, STRONG STAINING INTENSITY Proliferation Marker Ki67: 15% 1. FLUORESCENCE IN-SITU HYBRIDIZATION Results: GROUP 5: HER2 **NEGATIVE**  2. PROGNOSTIC INDICATORS Results: The tumor cells are EQUIVOCAL for Her2 (2+). Her2 by [place] will be performed and results reported separately Estrogen Receptor: 100%, POSITIVE, STRONG STAINING INTENSITY Progesterone Receptor: 100%, POSITIVE, STRONG STAINING INTENSITY 2. FLUORESCENCE IN-SITU HYBRIDIZATION Results: GROUP 5: HER2 **NEGATIVE**   01/21/2021 Miscellaneous   Mammaprint: LOW RISK   02/04/2021 Initial Diagnosis   Malignant neoplasm of upper-outer quadrant of left breast in female, estrogen receptor positive ([place])   02/04/2021 Imaging   EXAM: BILATERAL BREAST MRI WITH AND WITHOUT CONTRAST  IMPRESSION: 1. Known malignancy in the LATERAL portion of the LEFT breast measures 3.0 centimeters on MRI. 2. Single enlarged LEFT axillary lymph node represents the recently biopsied node with metastatic carcinoma. 3. Two additional suspicious masses in the UPPER central LEFT breast warranting tissue diagnosis. Given the very heterogeneous appearance of the LEFT breast, with numerous cysts, MR guided core biopsy is indicated for optimal targeting. 4. RIGHT breast is negative.   02/13/2021 Pathology Results   Diagnosis 1. Breast, left, needle core biopsy, upper outer quadrant(anterior lesion) - FOCAL INTRAVASCULAR TUMOR. - DUCTAL PAPILLOMA. - COLUMNAR CELL CHANGE WITH CALCIFICATIONS. - PSEUDOANGIOMATOUS STROMAL HYPERPLASIA ([place]). - SEE MICROSCOPIC DESCRIPTION. 2. Breast, left, needle core biopsy, upper outer quadrant(posterior lesion) - INVASIVE AND IN SITU DUCTAL CARCINOMA, GRADE 2. - SEE MICROSCOPIC DESCRIPTION.   02/17/2021 Genetic Testing   Negative hereditary cancer genetic testing: no pathogenic variants detected in [facility]  CancerNext-Expanded +RNAinsight Panel.  The report date is 02/17/2021.    The CancerNext-Expanded gene panel offered by [facility] and includes sequencing,  rearrangement, and RNA analysis for the following 77 genes: AIP, ALK, APC, ATM, AXIN2, BAP1, BARD1, BLM, BMPR1A, BRCA1, BRCA2, BRIP1, CDC73, CDH1, CDK4, CDKN1B, CDKN2A, CHEK2, CTNNA1, DICER1, FANCC, FH, FLCN, GALNT12, KIF1B, LZTR1, MAX, MEN1, MET, MLH1, MSH2, MSH3, MSH6, MUTYH, NBN, NF1, NF2, NTHL1, PALB2, PHOX2B, PMS2, POT1, PRKAR1A, PTCH1, PTEN, RAD51C, RAD51D, RB1, RECQL, RET, SDHA, SDHAF2, SDHB, SDHC, SDHD, SMAD4, SMARCA4, SMARCB1, SMARCE1, STK11, SUFU, TMEM127, TP53, TSC1, TSC2, VHL and XRCC2 (sequencing and deletion/duplication); EGFR, EGLN1, HOXB13, KIT, MITF, PDGFRA, POLD1, and POLE (sequencing only); EPCAM and GREM1 (deletion/duplication only).    04/21/2021 Cancer Staging   Staging form: Breast, AJCC 8th Edition - Pathologic stage from 04/21/2021: Stage IA (pT1c, pN1(sn), cM0, G2, ER+, PR+, HER2-) - Signed by [name], PA-C on 04/21/2021 Stage prefix: Initial diagnosis Method of lymph node assessment: Sentinel lymph node biopsy Multigene prognostic tests performed: MammaPrint Histologic grading system: 3 grade system    Definitive Surgery   FINAL MICROSCOPIC DIAGNOSIS:   A. OVARY AND FALLOPIAN TUBE, RIGHT:  -  Benign ovary  -  Benign fallopian tube with paratubal cyst(s)  -  No malignancy identified   B. OVARY AND FALLOPIAN TUBE, LEFT:  -  Endometriosis involving fallopian tube  -  Benign ovary with a simple serous cyst  -  No malignancy identified   C. LYMPH NODE, LEFT AXILLARY #2, SENTINEL, EXCISION:  -  Metastatic carcinoma involving one lymph node (1/1)  -  No extracapsular extension identified   D. BREAST, RIGHT, MASTECTOMY:  -  Fibroadenomatoid nodule  -  Usual ductal hyperplasia, columnar cell and fibrocystic changes with calcifications  -  No malignancy identified   E. BREAST, LEFT, MASTECTOMY:   -  Invasive ductal carcinoma, Nottingham grade 2 of 3, 1.8 cm  -  Ductal carcinoma in-situ, intermediate grade  -  Calcifications associated with carcinoma  -  Margins uninvolved by carcinoma  -  Previous biopsy site changes present (x3)  -  See oncology table and comment below   F. LYMPH NODE, LEFT AXILLARY #1, SENTINEL, EXCISION:  -  No carcinoma identified in one lymph node (0/1)   G. LYMPH NODE, LEFT AXILLARY #3, SENTINEL, EXCISION:  -  No carcinoma identified in one lymph node (0/1)   H. LYMPH NODE, LEFT AXILLARY #4, SENTINEL, EXCISION:  -  No carcinoma identified in one lymph node (0/1)   I. LYMPH NODE, LEFT AXILLARY #5, SENTINEL, EXCISION:  -  No carcinoma identified in one lymph node (0/1)    06/17/2021 - 07/31/2021 Radiation Therapy   Site Technique Total Dose (Gy) Dose per Fx (Gy) Completed Fx Beam Energies  Chest Wall, Left: CW_L 3D 50.4/50.4 1.8 28/28 10X  Chest Wall, Left: CW_L_SCLV 3D 50.4/50.4 1.8 28/28 6X, 10X  Chest Wall, Left: CW_L_Bst Electron 10/10 2 5/5 9E     08/2021 -  Anti-estrogen oral therapy   Letrozole     INTERVAL HISTORY:  Ms. Strahan to review her survivorship care plan detailing her treatment course for breast cancer, as well as monitoring long-term side effects of that treatment, education regarding health maintenance, screening, and overall wellness and health promotion.     Overall, Ms. Kuipers reports feeling quite well.  She is taking letrozole daily and tolerates it moderately well.  She does have some hot flashes and notes that the gabapentin that she takes for her breast pain also seems to help with those.  She is also having dryness in her mouth and wants to know if I have  any suggestions for that.  REVIEW OF SYSTEMS:  Review of Systems  Constitutional:  Negative for appetite change, chills, fatigue, fever and unexpected weight change.  HENT:   Negative for hearing loss, lump/mass and trouble swallowing.   Eyes:  Negative for eye  problems and icterus.  Respiratory:  Negative for chest tightness, cough and shortness of breath.   Cardiovascular:  Negative for chest pain, leg swelling and palpitations.  Gastrointestinal:  Negative for abdominal distention, abdominal pain, constipation, diarrhea, nausea and vomiting.  Endocrine: Positive for hot flashes.  Genitourinary:  Negative for difficulty urinating.   Musculoskeletal:  Negative for arthralgias.  Skin:  Negative for itching and rash.  Neurological:  Negative for dizziness, extremity weakness, headaches and numbness.  Hematological:  Negative for adenopathy. Does not bruise/bleed easily.  Psychiatric/Behavioral:  Negative for depression. The patient is not nervous/anxious.   Breast: Denies any new nodularity, masses, tenderness, nipple changes, or nipple discharge.      ONCOLOGY TREATMENT TEAM:  1. Surgeon:  Dr. Barry Dienes at [facility] 2. Medical Oncologist: Dr. Morey Hummingbird 3. Radiation Oncologist: Dr. 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     LAPAROSCOPY N/A 03/27/2021   Procedure: DIAGNOSTIC LAPAROSCOPY ;  Surgeon: Everlene Farrier, MD;  Location: [place];  Service: Gynecology;  Laterality: N/A;   LAPAROTOMY N/A 03/27/2021   Procedure: MINI LAPAROTOMY;  Surgeon: Everlene Farrier, MD;  Location: [place];  Service: Gynecology;  Laterality: N/A;   MASTECTOMY W/ SENTINEL NODE BIOPSY Left 03/27/2021   Procedure: LEFT MASTECTOMY WITH SENTINEL LYMPH NODE BIOPSY;  Surgeon: Stark Klein, MD;  Location: [place];  Service: General;  Laterality: Left;   RADIOACTIVE SEED GUIDED AXILLARY SENTINEL LYMPH NODE Left 03/27/2021   Procedure: RADIOACTIVE SEED  GUIDED LEFT AXILLARY SENTINEL LYMPH NODE BIOPSY;  Surgeon: Stark Klein, MD;  Location: [place];  Service: General;  Laterality: Left;   SALPINGOOPHORECTOMY Bilateral 03/27/2021   Procedure: OPEN BILATERAL SALPINGO OOPHORECTOMY;  Surgeon: Everlene Farrier, MD;  Location: [place];  Service: Gynecology;  Laterality: Bilateral;   TOTAL MASTECTOMY Right 03/27/2021   Procedure: RIGHT TOTAL MASTECTOMY;  Surgeon: Stark Klein, MD;  Location: [place];  Service: General;  Laterality: Right;     ALLERGIES:  Allergies  Allergen Reactions   Betadine [Povidone-Iodine] Anaphylaxis    Specifically betadine scrub   Latex Rash   Pistachio Nut (Diagnostic) Hives     CURRENT MEDICATIONS:  Outpatient Encounter Medications as of 10/17/2021  Medication Sig   CALCIUM-VITAMIN D PO Take by mouth daily.  Pt takes once in the evening   gabapentin (NEURONTIN) 300 MG capsule Take 1 capsule (300 mg total) by mouth at bedtime.   letrozole (FEMARA) 2.5 MG tablet Take 1 tablet (2.5 mg total) by mouth daily.   metFORMIN (GLUCOPHAGE) 500 MG tablet Take 500 mg by mouth 2 (two) times daily with a meal.   [DISCONTINUED] ibuprofen (ADVIL) 600 MG tablet Take 1 tablet (600 mg total) by mouth every 6 (six) hours as needed for headache, moderate pain or mild pain. (Patient not taking: Reported on 10/17/2021)   [DISCONTINUED] methocarbamol (ROBAXIN) 500 MG tablet Take 1 tablet (500 mg total)  by mouth every 6 (six) hours as needed for muscle spasms. (Patient not taking: Reported on 10/17/2021)   [DISCONTINUED] oxyCODONE (OXY IR/ROXICODONE) 5 MG immediate release tablet Take 1-3 tablets (5-15 mg total) by mouth every 4 (four) hours as needed for moderate pain, severe pain or breakthrough pain. (Patient not taking: Reported on 10/17/2021)   [DISCONTINUED] senna (SENOKOT) 8.6 MG TABS tablet Take 1 tablet (8.6 mg total) by mouth 2 (two) times daily. (Patient not taking: Reported on 10/17/2021)   [DISCONTINUED] tamoxifen (NOLVADEX) 20 MG tablet Take  1 tablet (20 mg total) by mouth daily. Stop taking 1 wk prior to surgery date (Patient not taking: Reported on 10/17/2021)   No facility-administered encounter medications on file as of 10/17/2021.     ONCOLOGIC FAMILY HISTORY:  Family History  Problem Relation Age of Onset   Hypertension Mother    Hyperlipidemia Mother    Melanoma Mother 70       forehead; surgery only   Depression Father    Hyperlipidemia Father    Hypertension Father    Alcohol abuse Father    Early death Father    Asthma Sister    Depression Sister    Diabetes Sister    Kidney disease Sister    Obesity Sister    Alcohol abuse Maternal Grandfather    Stroke Maternal Grandfather    Cancer Maternal Grandfather        oral cancer; d. early 77s   Hodgkin's lymphoma Paternal Grandfather        d. <60?   Throat cancer Cousin        maternal female cousin; dx 18s     GENETIC COUNSELING/TESTING: Negative  SOCIAL HISTORY:  Social History   Socioeconomic History   Marital status: Married    Spouse name: Not on file   Number of children: 1   Years of education: Not on file   Highest education level: Not on file  Occupational History   Occupation: IT trainer  Tobacco Use   Smoking status: Former    Packs/day: 0.25    Years: 10.00    Total pack years: 2.50    Types: Cigarettes    Quit date: 03/09/2009    Years since quitting: 12.6   Smokeless tobacco: Never  Vaping Use   Vaping Use: Never used  Substance and Sexual Activity   Alcohol use: Yes    Comment: 2-4 drinks per year   Drug use: Never   Sexual activity: Yes    Birth control/protection: Condom  Other Topics Concern   Not on file  Social History Narrative   Not on file   Social Determinants of Health   Financial Resource Strain: Not on file  Food Insecurity: Not on file  Transportation Needs: Not on file  Physical Activity: Not on file  Stress: Not on file  Social Connections: Not on file  Intimate Partner Violence: Not At  Risk (02/18/2021)   Humiliation, Afraid, Rape, and Kick questionnaire    Fear of Current or Ex-Partner: No    Emotionally Abused: No    Physically Abused: No    Sexually Abused: No     OBSERVATIONS/OBJECTIVE:  BP 130/81 (BP Location: Left Arm, Patient Position: Sitting)   Pulse (!) 111   Temp 97.8 F (36.6 C) (Temporal)   Resp 18   Ht $R'5\' 4"'tc$  (1.626 m)   Wt 187 lb 11.2 oz (85.1 kg)   SpO2 97%   BMI 32.22 kg/m  GENERAL: Patient is a  well appearing female in no acute distress HEENT:  Sclerae anicteric.  Oropharynx clear and moist. No ulcerations or evidence of oropharyngeal candidiasis. Neck is supple.  NODES:  No cervical, supraclavicular, or axillary lymphadenopathy palpated.  BREAST EXAM:  Deferred. LUNGS:  Clear to auscultation bilaterally.  No wheezes or rhonchi. HEART:  Regular rate and rhythm. No murmur appreciated. ABDOMEN:  Soft, nontender.  Positive, normoactive bowel sounds. No organomegaly palpated. MSK:  No focal spinal tenderness to palpation. Full range of motion bilaterally in the upper extremities. EXTREMITIES:  No peripheral edema.   SKIN:  Clear with no obvious rashes or skin changes. No nail dyscrasia. NEURO:  Nonfocal. Well oriented.  Appropriate affect.  LABORATORY DATA:  None for this visit.  DIAGNOSTIC IMAGING:  None for this visit.      ASSESSMENT AND PLAN:  Ms.. Prisco is a pleasant 46 y.o. female with Stage IA left breast invasive ductal carcinoma, ER+/PR+/HER2-, diagnosed in 01/2021, treated with lumpectomy, adjuvant radiation therapy, and anti-estrogen therapy with Letrozole.  She presents to the Survivorship Clinic for our initial meeting and routine follow-up post-completion of treatment for breast cancer.    1. Stage IA left breast cancer:  Ms. Brunsman is continuing to recover from definitive treatment for breast cancer. She will follow-up with her medical oncologist, Dr. Burr Medico with history and physical exam per surveillance protocol.  She  will continue her anti-estrogen therapy with Letrozole. Thus far, she is tolerating the Letrozole well, with minimal side effects. I recommended that she try lozenges for her hot flashes.  Today, a comprehensive survivorship care plan and treatment summary was reviewed with the patient today detailing her breast cancer diagnosis, treatment course, potential late/long-term effects of treatment, appropriate follow-up care with recommendations for the future, and patient education resources.  A copy of this summary, along with a letter will be sent to the patient's primary care provider via mail/fax/In Basket message after today's visit.    2. Bone health:  Given Ms. Geeslin's age/history of breast cancer and her current treatment regimen including anti-estrogen therapy with letrozole, she is at risk for bone demineralization.  I placed orders for bone density testing for her to undergo at [facility]  She was given education on specific activities to promote bone health.  3. Cancer screening:  Due to Ms. Jupin's history and her age, she should receive screening for skin cancers, colon cancer, and gynecologic cancers.  The information and recommendations are listed on the patient's comprehensive care plan/treatment summary and were reviewed in detail with the patient.    4. Health maintenance and wellness promotion: Ms. Petitjean was encouraged to consume 5-7 servings of fruits and vegetables per day. We reviewed the "Nutrition Rainbow" handout.  She was also encouraged to engage in moderate to vigorous exercise for 30 minutes per day most days of the week. We discussed the [employer] fitness program, which is designed for cancer survivors to help them become more physically fit after cancer treatments.  She was instructed to limit her alcohol consumption and continue to abstain from tobacco use.     5. Support services/counseling: It is not uncommon for this period of the patient's cancer  care trajectory to be one of many emotions and stressors.  We discussed how this can be increasingly difficult during the times of quarantine and social distancing due to the COVID-19 pandemic.   She was given information regarding our available services and encouraged to contact me with any questions or for help enrolling  in any of our support group/programs.    Follow up instructions:    -Return to cancer center 12/2021  -She is welcome to return back to the Survivorship Clinic at any time; no additional follow-up needed at this time.  -Consider referral back to survivorship as a long-term survivor for continued surveillance  The patient was provided an opportunity to ask questions and all were answered. The patient agreed with the plan and demonstrated an understanding of the instructions.   Total encounter time:40 minutes*in face-to-face visit time, chart review, lab review, care coordination, order entry, and documentation of the encounter time.    Wilber Bihari, NP 10/17/21 9:31 AM Medical Oncology and Hematology [facility] [place], [place] [address] Tel. [phone]    Fax. [phone]  *Total Encounter Time as defined by the Centers for Medicare and Medicaid Services includes, in addition to the face-to-face time of a patient visit (documented in the note above) non-face-to-face time: obtaining and reviewing outside history, ordering and reviewing medications, tests or procedures, care coordination (communications with other health care professionals or caregivers) and documentation in the medical record.

## 2021-10-30 ENCOUNTER — Ambulatory Visit (HOSPITAL_BASED_OUTPATIENT_CLINIC_OR_DEPARTMENT_OTHER)
Admission: RE | Admit: 2021-10-30 | Discharge: 2021-10-30 | Disposition: A | Discharge status: Home / Self Care | Payer: BC Managed Care – PPO | Source: Ambulatory Visit | Attending: Adult Health | Admitting: Adult Health

## 2021-10-30 DIAGNOSIS — E2839 Other primary ovarian failure: Secondary | ICD-10-CM | POA: Diagnosis not present

## 2021-10-30 DIAGNOSIS — Z780 Asymptomatic menopausal state: Secondary | ICD-10-CM | POA: Diagnosis not present

## 2021-11-03 ENCOUNTER — Encounter: Payer: Self-pay | Admitting: Adult Health

## 2021-11-03 ENCOUNTER — Telehealth: Payer: Self-pay | Admitting: *Deleted

## 2021-11-03 DIAGNOSIS — Z170 Estrogen receptor positive status [ER+]: Secondary | ICD-10-CM | POA: Diagnosis not present

## 2021-11-03 DIAGNOSIS — C50412 Malignant neoplasm of upper-outer quadrant of left female breast: Secondary | ICD-10-CM | POA: Diagnosis not present

## 2021-11-03 NOTE — Telephone Encounter (Signed)
RN placed call to pt regarding recent bone density results. Pt verbalized understanding.

## 2021-11-03 NOTE — Telephone Encounter (Signed)
-----   Message from Gardenia Phlegm, NP sent at 11/02/2021 10:28 PM EDT ----- Regarding: FW: Bone density normal, please notify patient of good results ----- Message ----- From: Interface, Rad Results In Sent: 10/30/2021   9:58 AM EDT To: Gardenia Phlegm, NP

## 2021-11-04 DIAGNOSIS — C50412 Malignant neoplasm of upper-outer quadrant of left female breast: Secondary | ICD-10-CM | POA: Diagnosis not present

## 2021-11-04 DIAGNOSIS — Z170 Estrogen receptor positive status [ER+]: Secondary | ICD-10-CM | POA: Diagnosis not present

## 2021-12-18 ENCOUNTER — Other Ambulatory Visit: Payer: Self-pay | Admitting: Hematology

## 2021-12-24 ENCOUNTER — Encounter: Payer: Self-pay | Admitting: Hematology

## 2021-12-24 ENCOUNTER — Other Ambulatory Visit: Payer: Self-pay

## 2021-12-24 MED ORDER — LETROZOLE 2.5 MG PO TABS
2.5000 mg | ORAL_TABLET | Freq: Every day | ORAL | 3 refills | Status: DC
Start: 2021-12-24 — End: 2022-05-05

## 2021-12-25 DIAGNOSIS — Z01419 Encounter for gynecological examination (general) (routine) without abnormal findings: Secondary | ICD-10-CM | POA: Diagnosis not present

## 2021-12-25 DIAGNOSIS — Z124 Encounter for screening for malignant neoplasm of cervix: Secondary | ICD-10-CM | POA: Diagnosis not present

## 2021-12-25 DIAGNOSIS — Z6833 Body mass index (BMI) 33.0-33.9, adult: Secondary | ICD-10-CM | POA: Diagnosis not present

## 2022-01-01 ENCOUNTER — Inpatient Hospital Stay: Payer: BC Managed Care – PPO | Attending: Radiation Oncology

## 2022-01-01 DIAGNOSIS — Z79899 Other long term (current) drug therapy: Secondary | ICD-10-CM | POA: Insufficient documentation

## 2022-01-01 DIAGNOSIS — R932 Abnormal findings on diagnostic imaging of liver and biliary tract: Secondary | ICD-10-CM | POA: Diagnosis not present

## 2022-01-01 DIAGNOSIS — Z9013 Acquired absence of bilateral breasts and nipples: Secondary | ICD-10-CM | POA: Insufficient documentation

## 2022-01-01 DIAGNOSIS — Z9221 Personal history of antineoplastic chemotherapy: Secondary | ICD-10-CM | POA: Diagnosis not present

## 2022-01-01 DIAGNOSIS — C50412 Malignant neoplasm of upper-outer quadrant of left female breast: Secondary | ICD-10-CM | POA: Insufficient documentation

## 2022-01-01 DIAGNOSIS — K769 Liver disease, unspecified: Secondary | ICD-10-CM | POA: Insufficient documentation

## 2022-01-01 DIAGNOSIS — Z923 Personal history of irradiation: Secondary | ICD-10-CM | POA: Diagnosis not present

## 2022-01-01 DIAGNOSIS — Z79811 Long term (current) use of aromatase inhibitors: Secondary | ICD-10-CM | POA: Diagnosis not present

## 2022-01-01 DIAGNOSIS — Z170 Estrogen receptor positive status [ER+]: Secondary | ICD-10-CM | POA: Diagnosis not present

## 2022-01-01 DIAGNOSIS — R232 Flushing: Secondary | ICD-10-CM | POA: Insufficient documentation

## 2022-01-01 LAB — CBC WITH DIFFERENTIAL/PLATELET
Abs Immature Granulocytes: 0.06 10*3/uL (ref 0.00–0.07)
Basophils Absolute: 0.1 10*3/uL (ref 0.0–0.1)
Basophils Relative: 1 %
Eosinophils Absolute: 0.2 10*3/uL (ref 0.0–0.5)
Eosinophils Relative: 3 %
HCT: 43.7 % (ref 36.0–46.0)
Hemoglobin: 14.3 g/dL (ref 12.0–15.0)
Immature Granulocytes: 1 %
Lymphocytes Relative: 27 %
Lymphs Abs: 2 10*3/uL (ref 0.7–4.0)
MCH: 28.1 pg (ref 26.0–34.0)
MCHC: 32.7 g/dL (ref 30.0–36.0)
MCV: 85.9 fL (ref 80.0–100.0)
Monocytes Absolute: 0.6 10*3/uL (ref 0.1–1.0)
Monocytes Relative: 9 %
Neutro Abs: 4.2 10*3/uL (ref 1.7–7.7)
Neutrophils Relative %: 59 %
Platelets: 280 10*3/uL (ref 150–400)
RBC: 5.09 MIL/uL (ref 3.87–5.11)
RDW: 13.4 % (ref 11.5–15.5)
WBC: 7.1 10*3/uL (ref 4.0–10.5)
nRBC: 0 % (ref 0.0–0.2)

## 2022-01-01 LAB — COMPREHENSIVE METABOLIC PANEL
ALT: 37 U/L (ref 0–44)
AST: 23 U/L (ref 15–41)
Albumin: 4.2 g/dL (ref 3.5–5.0)
Alkaline Phosphatase: 87 U/L (ref 38–126)
Anion gap: 7 (ref 5–15)
BUN: 14 mg/dL (ref 6–20)
CO2: 29 mmol/L (ref 22–32)
Calcium: 10.1 mg/dL (ref 8.9–10.3)
Chloride: 103 mmol/L (ref 98–111)
Creatinine, Ser: 0.82 mg/dL (ref 0.44–1.00)
GFR, Estimated: >60 mL/min (ref >60)
Glucose, Bld: 147 mg/dL — ABNORMAL HIGH (ref 70–99)
Potassium: 4.6 mmol/L (ref 3.5–5.1)
Sodium: 139 mmol/L (ref 135–145)
Total Bilirubin: 0.3 mg/dL (ref 0.3–1.2)
Total Protein: 7.5 g/dL (ref 6.5–8.1)

## 2022-01-05 ENCOUNTER — Other Ambulatory Visit: Payer: Self-pay

## 2022-01-05 ENCOUNTER — Encounter: Payer: Self-pay | Admitting: Hematology

## 2022-01-05 ENCOUNTER — Inpatient Hospital Stay (HOSPITAL_BASED_OUTPATIENT_CLINIC_OR_DEPARTMENT_OTHER): Payer: BC Managed Care – PPO | Admitting: Hematology

## 2022-01-05 VITALS — BP 120/67 | HR 78 | Temp 98.0°F | Resp 18 | Ht 64.0 in | Wt 193.2 lb

## 2022-01-05 DIAGNOSIS — Z170 Estrogen receptor positive status [ER+]: Secondary | ICD-10-CM

## 2022-01-05 DIAGNOSIS — Z9013 Acquired absence of bilateral breasts and nipples: Secondary | ICD-10-CM | POA: Diagnosis not present

## 2022-01-05 DIAGNOSIS — C50412 Malignant neoplasm of upper-outer quadrant of left female breast: Secondary | ICD-10-CM | POA: Diagnosis not present

## 2022-01-05 DIAGNOSIS — K769 Liver disease, unspecified: Secondary | ICD-10-CM

## 2022-01-05 DIAGNOSIS — Z79811 Long term (current) use of aromatase inhibitors: Secondary | ICD-10-CM | POA: Diagnosis not present

## 2022-01-05 DIAGNOSIS — R932 Abnormal findings on diagnostic imaging of liver and biliary tract: Secondary | ICD-10-CM | POA: Diagnosis not present

## 2022-01-05 DIAGNOSIS — Z9221 Personal history of antineoplastic chemotherapy: Secondary | ICD-10-CM | POA: Diagnosis not present

## 2022-01-05 DIAGNOSIS — Z923 Personal history of irradiation: Secondary | ICD-10-CM | POA: Diagnosis not present

## 2022-01-05 DIAGNOSIS — Z79899 Other long term (current) drug therapy: Secondary | ICD-10-CM | POA: Diagnosis not present

## 2022-01-05 DIAGNOSIS — R232 Flushing: Secondary | ICD-10-CM | POA: Diagnosis not present

## 2022-01-05 NOTE — Progress Notes (Addendum)
[place]   [phone] [phone]) [phone]   Clinic Follow up Note   Patient Care Team: Sueanne Margarita, DO as PCP - General (Internal Medicine) Stark Klein, MD as Consulting Physician (General Surgery) [name], MD as Consulting Physician (Hematology) Kyung Rudd, MD as Consulting Physician (Radiation Oncology) Marcos Eke, MD as Referring Physician (Plastic Surgery)  Date of Service:  01/05/2022  CHIEF COMPLAINT: f/u of left breast cancer  CURRENT THERAPY:  Letrozole, starting 08/2021  ASSESSMENT & PLAN:  Diana Weaver is a 46 y.o. post-BSO female with   1. Malignant neoplasm of upper-outer quadrant of left breast, invasive ductal carcinoma, Stage IB, mpT1c, N1a, cM0, ER+/PR+/HER2-, Grade 2, MammaPrint low risk -found on screening mammogram. Mammaprint on her biopsy showed low risk. She started neoadjuvant tamoxifen on 02/21/21 in anticipation of surgical delays.  -she opted to proceed with bilateral mastectomies and BSO on 03/27/21. 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 -s/p radiation under Dr. Lisbeth Renshaw 06/17/21 - 07/31/21 -she started letrozole in 08/2021. She reports hot flashes, managed with gabapentin TID. -she is clinically doing very well. Labs from 10/26 reviewed, overall WNL except BG 147. Physical exam was unremarkable. There is no clinical concern for recurrence. -continue surveillance, no mammogram s/p b/l mastectomy. -she is scheduled for reconstruction with DIEP flap on 01/26/22 with Dr. Dennison Bulla.   2. Bone Health -DEXA on 10/30/21 was normal, with lowest T-score -0.8 at [facility] spine  3. Liver lesion -incidental findings on breast MRI, liver MRI on 06/20/2021 showed 1.9 cm enhancing mass in the posterior right hepatic lobe, likely adenoma or FNH.  Follow-up MRI in 3 to 6 months was recommended -Patient will call radiology to schedule the repeat MRI in next few weeks.   PLAN:  -continue  letrozole (OK to continue through her surgery) -Bilateral reconstruction in late November -lab and f/u with NP Mendel Ryder in 6 months   No problem-specific Assessment & Plan notes found for this encounter.   SUMMARY OF ONCOLOGIC HISTORY: Oncology History Overview Note   Cancer Staging  Malignant neoplasm of upper-outer quadrant of left breast in female, estrogen receptor positive ([place]) Staging form: Breast, AJCC 8th Edition - Clinical stage from 01/21/2021: Stage IB (cT1c, cN1, cM0, G2, ER+, PR+, HER2-) - Signed by [name], MD on 02/04/2021 - Pathologic stage from 04/21/2021: Stage IA (pT1c, pN1(sn), cM0, G2, ER+, PR+, HER2-) - Signed by [name], PA-C on 04/21/2021     Malignant neoplasm of upper-outer quadrant of left breast in female, estrogen receptor positive ([name])  01/14/2021 Mammogram   EXAM: DIGITAL DIAGNOSTIC BILATERAL MAMMOGRAM WITH TOMOSYNTHESIS AND CAD; ULTRASOUND LEFT BREAST LIMITED  IMPRESSION: 1. Suspicious left breast mass at the 2:30 position 9 cm from the nipple corresponding with the screening mammographic findings. Recommendation is for ultrasound-guided biopsy. 2. Single, morphologically abnormal left axillary lymph node. Recommendation is for ultrasound-guided biopsy. 3. No persistent, suspicious findings in the right breast.   01/21/2021 Cancer Staging   Staging form: Breast, AJCC 8th Edition - Clinical stage from 01/21/2021: Stage IB (cT1c, cN1, cM0, G2, ER+, PR+, HER2-) - Signed by [name], MD on 02/04/2021 Stage prefix: Initial diagnosis Histologic grading system: 3 grade system   01/21/2021 Pathology Results   Diagnosis 1. Breast, left, needle core biopsy, 2:30 o'clock, ribbon, 9cmfn - INVASIVE DUCTAL CARCINOMA - DUCTAL CARCINOMA IN SITU - SEE COMMENT 2. Lymph node, needle/core biopsy, left axillary node, tribell - METASTATIC CARCINOMA INVOLVING NODAL  TISSUE Microscopic Comment 1. Based on the biopsy, the carcinoma appears  Nottingham grade 2 of 3 and measures 1.1 cm in greatest linear extent.  1. PROGNOSTIC INDICATORS Results: The tumor cells are EQUIVOCAL for Her2 (2+). Her2 by FISH will be performed and results reported separately. Estrogen Receptor: 100%, POSITIVE, STRONG STAINING INTENSITY Progesterone Receptor: 100%, POSITIVE, STRONG STAINING INTENSITY Proliferation Marker Ki67: 15% 1. FLUORESCENCE IN-SITU HYBRIDIZATION Results: GROUP 5: HER2 **NEGATIVE**  2. PROGNOSTIC INDICATORS Results: The tumor cells are EQUIVOCAL for Her2 (2+). Her2 by [place] will be performed and results reported separately Estrogen Receptor: 100%, POSITIVE, STRONG STAINING INTENSITY Progesterone Receptor: 100%, POSITIVE, STRONG STAINING INTENSITY 2. FLUORESCENCE IN-SITU HYBRIDIZATION Results: GROUP 5: HER2 **NEGATIVE**   01/21/2021 Miscellaneous   Mammaprint: LOW RISK   02/04/2021 Initial Diagnosis   Malignant neoplasm of upper-outer quadrant of left breast in female, estrogen receptor positive ([name])   02/04/2021 Imaging   EXAM: BILATERAL BREAST MRI WITH AND WITHOUT CONTRAST  IMPRESSION: 1. Known malignancy in the LATERAL portion of the LEFT breast measures 3.0 centimeters on MRI. 2. Single enlarged LEFT axillary lymph node represents the recently biopsied node with metastatic carcinoma. 3. Two additional suspicious masses in the UPPER central LEFT breast warranting tissue diagnosis. Given the very heterogeneous appearance of the LEFT breast, with numerous cysts, MR guided core biopsy is indicated for optimal targeting. 4. RIGHT breast is negative.   02/13/2021 Pathology Results   Diagnosis 1. Breast, left, needle core biopsy, upper outer quadrant(anterior lesion) - FOCAL INTRAVASCULAR TUMOR. - DUCTAL PAPILLOMA. - COLUMNAR CELL CHANGE WITH CALCIFICATIONS. - PSEUDOANGIOMATOUS STROMAL HYPERPLASIA ([place]). - SEE MICROSCOPIC DESCRIPTION. 2. Breast, left, needle core biopsy, upper outer quadrant(posterior lesion) -  INVASIVE AND IN SITU DUCTAL CARCINOMA, GRADE 2. - SEE MICROSCOPIC DESCRIPTION.   02/17/2021 Genetic Testing   Negative hereditary cancer genetic testing: no pathogenic variants detected in [facility] CancerNext-Expanded +RNAinsight Panel.  The report date is 02/17/2021.    The CancerNext-Expanded gene panel offered by [facility] and includes sequencing, rearrangement, and RNA analysis for the following 77 genes: AIP, ALK, APC, ATM, AXIN2, BAP1, BARD1, BLM, BMPR1A, BRCA1, BRCA2, BRIP1, CDC73, CDH1, CDK4, CDKN1B, CDKN2A, CHEK2, CTNNA1, DICER1, FANCC, FH, FLCN, GALNT12, KIF1B, LZTR1, MAX, MEN1, MET, MLH1, MSH2, MSH3, MSH6, MUTYH, NBN, NF1, NF2, NTHL1, PALB2, PHOX2B, PMS2, POT1, PRKAR1A, PTCH1, PTEN, RAD51C, RAD51D, RB1, RECQL, RET, SDHA, SDHAF2, SDHB, SDHC, SDHD, SMAD4, SMARCA4, SMARCB1, SMARCE1, STK11, SUFU, TMEM127, TP53, TSC1, TSC2, VHL and XRCC2 (sequencing and deletion/duplication); EGFR, EGLN1, HOXB13, KIT, MITF, PDGFRA, POLD1, and POLE (sequencing only); EPCAM and GREM1 (deletion/duplication only).    04/21/2021 Cancer Staging   Staging form: Breast, AJCC 8th Edition - Pathologic stage from 04/21/2021: Stage IA (pT1c, pN1(sn), cM0, G2, ER+, PR+, HER2-) - Signed by [name], PA-C on 04/21/2021 Stage prefix: Initial diagnosis Method of lymph node assessment: Sentinel lymph node biopsy Multigene prognostic tests performed: MammaPrint Histologic grading system: 3 grade system    Definitive Surgery   FINAL MICROSCOPIC DIAGNOSIS:   A. OVARY AND FALLOPIAN TUBE, RIGHT:  -  Benign ovary  -  Benign fallopian tube with paratubal cyst(s)  -  No malignancy identified   B. OVARY AND FALLOPIAN TUBE, LEFT:  -  Endometriosis involving fallopian tube  -  Benign ovary with a simple serous cyst  -  No malignancy identified   C. LYMPH NODE, LEFT AXILLARY #2, SENTINEL, EXCISION:  -  Metastatic carcinoma involving one lymph node (1/1)  -  No extracapsular extension identified   D. BREAST,  RIGHT, MASTECTOMY:  -  Fibroadenomatoid nodule  -  Usual ductal hyperplasia, columnar cell and fibrocystic changes with calcifications  -  No malignancy identified   E. BREAST, LEFT, MASTECTOMY:  -  Invasive ductal carcinoma, Nottingham grade 2 of 3, 1.8 cm  -  Ductal carcinoma in-situ, intermediate grade  -  Calcifications associated with carcinoma  -  Margins uninvolved by carcinoma  -  Previous biopsy site changes present (x3)  -  See oncology table and comment below   F. LYMPH NODE, LEFT AXILLARY #1, SENTINEL, EXCISION:  -  No carcinoma identified in one lymph node (0/1)   G. LYMPH NODE, LEFT AXILLARY #3, SENTINEL, EXCISION:  -  No carcinoma identified in one lymph node (0/1)   H. LYMPH NODE, LEFT AXILLARY #4, SENTINEL, EXCISION:  -  No carcinoma identified in one lymph node (0/1)   I. LYMPH NODE, LEFT AXILLARY #5, SENTINEL, EXCISION:  -  No carcinoma identified in one lymph node (0/1)    06/17/2021 - 07/31/2021 Radiation Therapy   Site Technique Total Dose (Gy) Dose per Fx (Gy) Completed Fx Beam Energies  Chest Wall, Left: CW_L 3D 50.4/50.4 1.8 28/28 10X  Chest Wall, Left: CW_L_SCLV 3D 50.4/50.4 1.8 28/28 6X, 10X  Chest Wall, Left: CW_L_Bst Electron 10/10 2 5/5 9E     08/2021 -  Anti-estrogen oral therapy   Letrozole      INTERVAL HISTORY:  SHABREKA COULON is here for a follow up of breast cancer. She was last seen by me on 07/21/21 with survivorship in the interim. She presents to the clinic alone. She reports she is doing well overall. She reports hot flashes from letrozole, but these are well managed with gabapentin 362m TID. She denies fatigue from gabapentin.   All other systems were reviewed with the patient and are negative.  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     LAPAROSCOPY N/A 03/27/2021   Procedure: DIAGNOSTIC LAPAROSCOPY ;  Surgeon: TEverlene Farrier MD;  [facility]  Service: Gynecology;  Laterality: N/A;   LAPAROTOMY N/A 03/27/2021   Procedure: MINI LAPAROTOMY;  Surgeon: TEverlene Farrier MD;  [facility]  Service: Gynecology;  Laterality: N/A;   MASTECTOMY W/ SENTINEL NODE BIOPSY Left 03/27/2021   Procedure: LEFT MASTECTOMY WITH SENTINEL LYMPH NODE BIOPSY;  Surgeon: BStark Klein MD;  [facility]  Service: General;  Laterality: Left;   RADIOACTIVE SEED GUIDED AXILLARY SENTINEL LYMPH NODE Left 03/27/2021   Procedure: RADIOACTIVE SEED GUIDED LEFT AXILLARY SENTINEL LYMPH NODE BIOPSY;  Surgeon: BStark Klein MD;  [facility]  Service: General;  Laterality: Left;   SALPINGOOPHORECTOMY Bilateral 03/27/2021   Procedure: OPEN BILATERAL SALPINGO OOPHORECTOMY;  Surgeon: TEverlene Farrier MD;  [facility]  Service: Gynecology;  Laterality: Bilateral;   TOTAL MASTECTOMY Right 03/27/2021   Procedure: RIGHT TOTAL MASTECTOMY;  Surgeon: BStark Klein MD;  [facility]  Service: General;  Laterality: Right;    I have reviewed the social history and family history with the patient and they are unchanged from previous note.  ALLERGIES:  is allergic to betadine [povidone-iodine], latex, and pistachio nut (diagnostic).  MEDICATIONS:  Current Outpatient Medications  Medication Sig Dispense Refill   CALCIUM-VITAMIN D PO Take by mouth daily.  Pt takes once in the evening     gabapentin (NEURONTIN) 300 MG capsule Take 1 capsule (300 mg total) by mouth at bedtime. 30 capsule 1   letrozole (FEMARA) 2.5 MG tablet Take 1 tablet (2.5 mg total) by mouth daily. 30 tablet 3   metFORMIN (GLUCOPHAGE) 500 MG tablet Take 500 mg by mouth 2 (two) times daily with a meal.     No current facility-administered medications for  this visit.    PHYSICAL EXAMINATION: ECOG PERFORMANCE STATUS: 0 - Asymptomatic  Vitals:   01/05/22 0817  BP: 120/67  Pulse: 78  Resp: 18  Temp: 98 F (36.7 C)  SpO2: 99%   Wt Readings from Last 3 Encounters:  01/05/22 193 lb 3.2 oz (87.6 kg)  10/17/21 187 lb 11.2 oz (85.1 kg)  07/21/21 187 lb 9.6 oz (85.1 kg)     GENERAL:alert, no distress and comfortable SKIN: skin color, texture, turgor are normal, no rashes or significant lesions EYES: normal, Conjunctiva are pink and non-injected, sclera clear  NECK: supple, thyroid normal size, non-tender, without nodularity LYMPH:  no palpable lymphadenopathy in the cervical, axillary LUNGS: clear to auscultation and percussion with normal breathing effort HEART: regular rate & rhythm and no murmurs and no lower extremity edema ABDOMEN:abdomen soft, non-tender and normal bowel sounds Musculoskeletal:no cyanosis of digits and no clubbing  NEURO: alert & oriented x 3 with fluent speech, no focal motor/sensory deficits BREAST: No palpable mass, nodules or adenopathy bilaterally. Breast exam benign.   LABORATORY DATA:  I have reviewed the data as listed    Latest Ref Rng & Units 01/01/2022    9:22 AM 03/28/2021    1:40 AM 03/24/2021    9:29 AM  CBC  WBC 4.0 - 10.5 K/uL 7.1  14.1  11.1   Hemoglobin 12.0 - 15.0 g/dL 14.3  9.7  13.7   Hematocrit 36.0 - 46.0 % 43.7  30.1  44.1   Platelets 150 - 400 K/uL 280  238  324         Latest Ref Rng & Units 01/01/2022    9:22 AM 03/28/2021    1:40 AM 03/24/2021    9:29 AM  CMP  Glucose 70 - 99 mg/dL 147  169  122   BUN 6 - 20 mg/dL _0 Creatinine 0.44 - 1.00 mg/dL 0.82  0.82  0.81   Sodium 135 - 145 mmol/L 139  135  139   Potassium 3.5 - 5.1 mmol/L 4.6  4.1  4.9   Chloride 98 - 111 mmol/L 103  102  104   CO2 22 - 32 mmol/L _1 Calcium 8.9 - 10.3 mg/dL 10.1  8.2  9.4   Total Protein 6.5 - 8.1 g/dL 7.5     Total Bilirubin 0.3 - 1.2 mg/dL 0.3     Alkaline Phos 38 - 126  U/L 87     AST 15 - 41 U/L 23     ALT 0 - 44 U/L 37         RADIOGRAPHIC STUDIES: I have personally reviewed the radiological images as listed and agreed with the findings in the report. No results found.    No orders of the defined types were placed in this encounter.  All questions were answered. The patient knows to call the clinic with any problems, questions  or concerns. No barriers to learning was detected. The total time spent in the appointment was 20 minutes.     [name], MD 01/05/2022   I, Wilburn Mylar, am acting as scribe for [name], MD.   I have reviewed the above documentation for accuracy and completeness, and I agree with the above.

## 2022-01-14 ENCOUNTER — Ambulatory Visit (HOSPITAL_COMMUNITY)
Admission: RE | Admit: 2022-01-14 | Discharge: 2022-01-14 | Disposition: A | Discharge status: Home / Self Care | Payer: BC Managed Care – PPO | Source: Ambulatory Visit | Attending: Hematology | Admitting: Hematology

## 2022-01-14 DIAGNOSIS — K769 Liver disease, unspecified: Secondary | ICD-10-CM | POA: Insufficient documentation

## 2022-01-14 DIAGNOSIS — K8020 Calculus of gallbladder without cholecystitis without obstruction: Secondary | ICD-10-CM | POA: Diagnosis not present

## 2022-01-14 DIAGNOSIS — K760 Fatty (change of) liver, not elsewhere classified: Secondary | ICD-10-CM | POA: Diagnosis not present

## 2022-01-14 DIAGNOSIS — K5730 Diverticulosis of large intestine without perforation or abscess without bleeding: Secondary | ICD-10-CM | POA: Diagnosis not present

## 2022-01-14 MED ORDER — GADOBUTROL 1 MMOL/ML IV SOLN
8.0000 mL | Freq: Once | INTRAVENOUS | Status: AC | PRN
  Administered 2022-01-14: 8 mL via INTRAVENOUS

## 2022-01-16 ENCOUNTER — Encounter: Payer: Self-pay | Admitting: Hematology

## 2022-01-16 ENCOUNTER — Inpatient Hospital Stay: Payer: BC Managed Care – PPO | Attending: Radiation Oncology | Admitting: Hematology

## 2022-01-16 DIAGNOSIS — C50412 Malignant neoplasm of upper-outer quadrant of left female breast: Secondary | ICD-10-CM

## 2022-01-16 DIAGNOSIS — Z170 Estrogen receptor positive status [ER+]: Secondary | ICD-10-CM

## 2022-01-16 NOTE — Progress Notes (Signed)
[place]   [phone] [phone]   Clinic Follow up Note   Patient Care Team: Sueanne Margarita, DO as PCP - General (Internal Medicine) Stark Klein, MD as Consulting Physician (General Surgery) [name], MD as Consulting Physician (Hematology) Kyung Rudd, MD as Consulting Physician (Radiation Oncology) Marcos Eke, MD as Referring Physician (Plastic Surgery)  Date of Service:  01/16/2022  I connected with Diana Weaver on 01/16/2022 at 11:20 AM EST by telephone visit and verified that I am speaking with the correct person using two identifiers.  I discussed the limitations, risks, security and privacy concerns of performing an evaluation and management service by telephone and the availability of in person appointments. I also discussed with the patient that there may be a patient responsible charge related to this service. The patient expressed understanding and agreed to proceed.   Other persons participating in the visit and their role in the encounter:  none  Patient's location:  home Provider's location:  my office  CHIEF COMPLAINT: review recent scan results, f/u of left breast cancer  CURRENT THERAPY:  Letrozole, starting 08/2021   ASSESSMENT & PLAN:  Diana Weaver is a 46 y.o. female with   1. Liver lesion, likely benign  -incidental findings on breast MRI, liver MRI on 06/20/21 showed 1.9 cm mass in the posterior right hepatic lobe, likely adenoma or FNH.  -repeat MRI 01/14/22 showed stable 1.9 cm right liver mass, likely benign  -I reviewed the results with her today. The radiologist again recommends f/u in 6-12 months. Given stability, I am comfortable repeating in 12 months. She is agreeable. We will order closer to then.  2. Malignant neoplasm of upper-outer quadrant of left breast, invasive ductal carcinoma, Stage IB, mpT1c, N1a, cM0, ER+/PR+/HER2-, Grade 2, MammaPrint low risk -diagnosed 01/21/21 by  screening mammogram. S/p bilateral mastectomies and BSO on 03/27/21 and radiation under Dr. Lisbeth Renshaw 06/17/21 - 07/31/21 -on letrozole since 08/2021. Hot flashes managed with gabapentin TID. -she is scheduled for reconstruction with DIEP flap on 01/26/22 with Dr. Dennison Bulla.   3. Bone Health -DEXA on 10/30/21 was normal, with lowest T-score -0.8 at [facility] spine     PLAN:  -continue letrozole (OK to continue through her surgery) -Bilateral reconstruction 01/26/22 -lab and f/u with NP Mendel Ryder 07/06/22, will order liver MRI on next visit    No problem-specific Assessment & Plan notes found for this encounter.   SUMMARY OF ONCOLOGIC HISTORY: Oncology History Overview Note   Cancer Staging  Malignant neoplasm of upper-outer quadrant of left breast in female, estrogen receptor positive ([place]) Staging form: Breast, AJCC 8th Edition - Clinical stage from 01/21/2021: Stage IB (cT1c, cN1, cM0, G2, ER+, PR+, HER2-) - Signed by [name], MD on 02/04/2021 - Pathologic stage from 04/21/2021: Stage IA (pT1c, pN1(sn), cM0, G2, ER+, PR+, HER2-) - Signed by [name], PA-C on 04/21/2021     Malignant neoplasm of upper-outer quadrant of left breast in female, estrogen receptor positive ([place])  01/14/2021 Mammogram   EXAM: DIGITAL DIAGNOSTIC BILATERAL MAMMOGRAM WITH TOMOSYNTHESIS AND CAD; ULTRASOUND LEFT BREAST LIMITED  IMPRESSION: 1. Suspicious left breast mass at the 2:30 position 9 cm from the nipple corresponding with the screening mammographic findings. Recommendation is for ultrasound-guided biopsy. 2. Single, morphologically abnormal left axillary lymph node. Recommendation is for ultrasound-guided biopsy. 3. No persistent, suspicious findings in the right breast.   01/21/2021 Cancer Staging   Staging form: Breast, AJCC 8th Edition - Clinical stage from 01/21/2021: Stage  IB (cT1c, cN1, cM0, G2, ER+, PR+, HER2-) - Signed by [name], MD on 02/04/2021 Stage prefix: Initial diagnosis Histologic  grading system: 3 grade system   01/21/2021 Pathology Results   Diagnosis 1. Breast, left, needle core biopsy, 2:30 o'clock, ribbon, 9cmfn - INVASIVE DUCTAL CARCINOMA - DUCTAL CARCINOMA IN SITU - SEE COMMENT 2. Lymph node, needle/core biopsy, left axillary node, tribell - METASTATIC CARCINOMA INVOLVING NODAL TISSUE Microscopic Comment 1. Based on the biopsy, the carcinoma appears Nottingham grade 2 of 3 and measures 1.1 cm in greatest linear extent.  1. PROGNOSTIC INDICATORS Results: The tumor cells are EQUIVOCAL for Her2 (2+). Her2 by FISH will be performed and results reported separately. Estrogen Receptor: 100%, POSITIVE, STRONG STAINING INTENSITY Progesterone Receptor: 100%, POSITIVE, STRONG STAINING INTENSITY Proliferation Marker Ki67: 15% 1. FLUORESCENCE IN-SITU HYBRIDIZATION Results: GROUP 5: HER2 **NEGATIVE**  2. PROGNOSTIC INDICATORS Results: The tumor cells are EQUIVOCAL for Her2 (2+). Her2 by [place] will be performed and results reported separately Estrogen Receptor: 100%, POSITIVE, STRONG STAINING INTENSITY Progesterone Receptor: 100%, POSITIVE, STRONG STAINING INTENSITY 2. FLUORESCENCE IN-SITU HYBRIDIZATION Results: GROUP 5: HER2 **NEGATIVE**   01/21/2021 Miscellaneous   Mammaprint: LOW RISK   02/04/2021 Initial Diagnosis   Malignant neoplasm of upper-outer quadrant of left breast in female, estrogen receptor positive ([place])   02/04/2021 Imaging   EXAM: BILATERAL BREAST MRI WITH AND WITHOUT CONTRAST  IMPRESSION: 1. Known malignancy in the LATERAL portion of the LEFT breast measures 3.0 centimeters on MRI. 2. Single enlarged LEFT axillary lymph node represents the recently biopsied node with metastatic carcinoma. 3. Two additional suspicious masses in the UPPER central LEFT breast warranting tissue diagnosis. Given the very heterogeneous appearance of the LEFT breast, with numerous cysts, MR guided core biopsy is indicated for optimal targeting. 4. RIGHT breast  is negative.   02/13/2021 Pathology Results   Diagnosis 1. Breast, left, needle core biopsy, upper outer quadrant(anterior lesion) - FOCAL INTRAVASCULAR TUMOR. - DUCTAL PAPILLOMA. - COLUMNAR CELL CHANGE WITH CALCIFICATIONS. - PSEUDOANGIOMATOUS STROMAL HYPERPLASIA ([place]). - SEE MICROSCOPIC DESCRIPTION. 2. Breast, left, needle core biopsy, upper outer quadrant(posterior lesion) - INVASIVE AND IN SITU DUCTAL CARCINOMA, GRADE 2. - SEE MICROSCOPIC DESCRIPTION.   02/17/2021 Genetic Testing   Negative hereditary cancer genetic testing: no pathogenic variants detected in [facility] CancerNext-Expanded +RNAinsight Panel.  The report date is 02/17/2021.    The CancerNext-Expanded gene panel offered by [facility] and includes sequencing, rearrangement, and RNA analysis for the following 77 genes: AIP, ALK, APC, ATM, AXIN2, BAP1, BARD1, BLM, BMPR1A, BRCA1, BRCA2, BRIP1, CDC73, CDH1, CDK4, CDKN1B, CDKN2A, CHEK2, CTNNA1, DICER1, FANCC, FH, FLCN, GALNT12, KIF1B, LZTR1, MAX, MEN1, MET, MLH1, MSH2, MSH3, MSH6, MUTYH, NBN, NF1, NF2, NTHL1, PALB2, PHOX2B, PMS2, POT1, PRKAR1A, PTCH1, PTEN, RAD51C, RAD51D, RB1, RECQL, RET, SDHA, SDHAF2, SDHB, SDHC, SDHD, SMAD4, SMARCA4, SMARCB1, SMARCE1, STK11, SUFU, TMEM127, TP53, TSC1, TSC2, VHL and XRCC2 (sequencing and deletion/duplication); EGFR, EGLN1, HOXB13, KIT, MITF, PDGFRA, POLD1, and POLE (sequencing only); EPCAM and GREM1 (deletion/duplication only).    04/21/2021 Cancer Staging   Staging form: Breast, AJCC 8th Edition - Pathologic stage from 04/21/2021: Stage IA (pT1c, pN1(sn), cM0, G2, ER+, PR+, HER2-) - Signed by [name], PA-C on 04/21/2021 Stage prefix: Initial diagnosis Method of lymph node assessment: Sentinel lymph node biopsy Multigene prognostic tests performed: MammaPrint Histologic grading system: 3 grade system    Definitive Surgery   FINAL MICROSCOPIC DIAGNOSIS:   A. OVARY AND FALLOPIAN TUBE, RIGHT:  -  Benign ovary  -  Benign fallopian  tube  with paratubal cyst(s)  -  No malignancy identified   B. OVARY AND FALLOPIAN TUBE, LEFT:  -  Endometriosis involving fallopian tube  -  Benign ovary with a simple serous cyst  -  No malignancy identified   C. LYMPH NODE, LEFT AXILLARY #2, SENTINEL, EXCISION:  -  Metastatic carcinoma involving one lymph node (1/1)  -  No extracapsular extension identified   D. BREAST, RIGHT, MASTECTOMY:  -  Fibroadenomatoid nodule  -  Usual ductal hyperplasia, columnar cell and fibrocystic changes with calcifications  -  No malignancy identified   E. BREAST, LEFT, MASTECTOMY:  -  Invasive ductal carcinoma, Nottingham grade 2 of 3, 1.8 cm  -  Ductal carcinoma in-situ, intermediate grade  -  Calcifications associated with carcinoma  -  Margins uninvolved by carcinoma  -  Previous biopsy site changes present (x3)  -  See oncology table and comment below   F. LYMPH NODE, LEFT AXILLARY #1, SENTINEL, EXCISION:  -  No carcinoma identified in one lymph node (0/1)   G. LYMPH NODE, LEFT AXILLARY #3, SENTINEL, EXCISION:  -  No carcinoma identified in one lymph node (0/1)   H. LYMPH NODE, LEFT AXILLARY #4, SENTINEL, EXCISION:  -  No carcinoma identified in one lymph node (0/1)   I. LYMPH NODE, LEFT AXILLARY #5, SENTINEL, EXCISION:  -  No carcinoma identified in one lymph node (0/1)    06/17/2021 - 07/31/2021 Radiation Therapy   Site Technique Total Dose (Gy) Dose per Fx (Gy) Completed Fx Beam Energies  Chest Wall, Left: CW_L 3D 50.4/50.4 1.8 28/28 10X  Chest Wall, Left: CW_L_SCLV 3D 50.4/50.4 1.8 28/28 6X, 10X  Chest Wall, Left: CW_L_Bst Electron 10/10 2 5/5 9E     08/2021 -  Anti-estrogen oral therapy   Letrozole      INTERVAL HISTORY:  ADIANNA DARWIN was contacted for a follow up of recent scan. She was last seen by me on 01/05/22. She reports she is doing well, denies any stomach pains.    All other systems were reviewed with the patient and are negative.  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     LAPAROSCOPY N/A 03/27/2021   Procedure: DIAGNOSTIC LAPAROSCOPY ;  Surgeon: Everlene Farrier, MD;  Location: [place];  Service: Gynecology;  Laterality: N/A;   LAPAROTOMY N/A 03/27/2021   Procedure: MINI LAPAROTOMY;  Surgeon: Everlene Farrier, MD;  Location: [place];  Service: Gynecology;  Laterality: N/A;   MASTECTOMY W/ SENTINEL NODE BIOPSY Left 03/27/2021   Procedure: LEFT MASTECTOMY WITH SENTINEL LYMPH NODE BIOPSY;  Surgeon: Stark Klein, MD;  Location: [place];  Service: General;  Laterality: Left;   RADIOACTIVE SEED GUIDED AXILLARY SENTINEL LYMPH NODE Left 03/27/2021   Procedure: RADIOACTIVE SEED GUIDED LEFT AXILLARY SENTINEL LYMPH NODE BIOPSY;  Surgeon: Stark Klein, MD;  Location: [place];  Service: General;  Laterality: Left;   SALPINGOOPHORECTOMY Bilateral 03/27/2021   Procedure: OPEN BILATERAL SALPINGO OOPHORECTOMY;  Surgeon: Everlene Farrier, MD;  Location: [place];  Service: Gynecology;  Laterality: Bilateral;   TOTAL MASTECTOMY Right 03/27/2021   Procedure: RIGHT TOTAL MASTECTOMY;  Surgeon: Stark Klein, MD;  Location: [place];  Service: General;  Laterality: Right;    I have reviewed the social history and family history with the patient and they are unchanged from previous note.  ALLERGIES:  is allergic to betadine [povidone-iodine], latex, and pistachio nut (diagnostic).  MEDICATIONS:  Current Outpatient Medications  Medication Sig Dispense Refill   CALCIUM-VITAMIN D PO Take by mouth daily.  Pt takes once in the evening     gabapentin (NEURONTIN) 300 MG capsule Take 1 capsule (300 mg total) by mouth at bedtime. 30 capsule 1   letrozole (FEMARA) 2.5 MG  tablet Take 1 tablet (2.5 mg total) by mouth daily. 30 tablet 3   metFORMIN (GLUCOPHAGE) 500 MG tablet Take 500 mg by mouth 2 (two) times daily with a meal.     No current facility-administered medications for this visit.    PHYSICAL EXAMINATION: ECOG PERFORMANCE STATUS: 0 - Asymptomatic  There were no vitals filed for this visit. Wt Readings from Last 3 Encounters:  01/05/22 193 lb 3.2 oz (87.6 kg)  10/17/21 187 lb 11.2 oz (85.1 kg)  07/21/21 187 lb 9.6 oz (85.1 kg)     No vitals taken today, Exam not performed today  LABORATORY DATA:  I have reviewed the data as listed    Latest Ref Rng & Units 01/01/2022    9:22 AM 03/28/2021    1:40 AM 03/24/2021    9:29 AM  CBC  WBC 4.0 - 10.5 K/uL 7.1  14.1  11.1   Hemoglobin 12.0 - 15.0 g/dL 14.3  9.7  13.7   Hematocrit 36.0 - 46.0 % 43.7  30.1  44.1   Platelets 150 - 400 K/uL 280  238  324         Latest Ref Rng & Units 01/01/2022    9:22 AM 03/28/2021    1:40 AM 03/24/2021    9:29 AM  CMP  Glucose 70 - 99 mg/dL 147  169  122   BUN 6 - 20 mg/dL _0 Creatinine 0.44 - 1.00 mg/dL 0.82  0.82  0.81   Sodium 135 - 145 mmol/L 139  135  139   Potassium 3.5 - 5.1 mmol/L 4.6  4.1  4.9   Chloride 98 - 111 mmol/L 103  102  104   CO2 22 - 32 mmol/L _1 Calcium 8.9 - 10.3 mg/dL 10.1  8.2  9.4   Total Protein 6.5 - 8.1 g/dL 7.5     Total Bilirubin 0.3 - 1.2 mg/dL 0.3     Alkaline Phos 38 - 126 U/L 87     AST 15 - 41 U/L 23     ALT 0 - 44 U/L 37         RADIOGRAPHIC STUDIES: I have personally reviewed the radiological images as listed and agreed with the findings in the report. No results found.    No orders of the defined types were placed in this encounter.  All questions were answered. The patient knows to call the clinic with any problems, questions or concerns. No barriers to learning was detected. The total time spent in the appointment was 15 minutes.     [name], MD 01/16/2022   I, Wilburn Mylar, am acting as scribe for [name], MD.   I have reviewed the above documentation for accuracy and completeness, and I agree with the above.

## 2022-02-18 DIAGNOSIS — Y832 Surgical operation with anastomosis, bypass or graft as the cause of abnormal reaction of the patient, or of later complication, without mention of misadventure at the time of the procedure: Secondary | ICD-10-CM | POA: Diagnosis not present

## 2022-02-18 DIAGNOSIS — R000 Tachycardia, unspecified: Secondary | ICD-10-CM | POA: Diagnosis not present

## 2022-02-18 DIAGNOSIS — I97621 Postprocedural hematoma of a circulatory system organ or structure following other procedure: Secondary | ICD-10-CM | POA: Diagnosis not present

## 2022-02-18 DIAGNOSIS — K449 Diaphragmatic hernia without obstruction or gangrene: Secondary | ICD-10-CM | POA: Diagnosis not present

## 2022-02-18 DIAGNOSIS — C50412 Malignant neoplasm of upper-outer quadrant of left female breast: Secondary | ICD-10-CM | POA: Diagnosis not present

## 2022-02-18 DIAGNOSIS — Z9013 Acquired absence of bilateral breasts and nipples: Secondary | ICD-10-CM | POA: Diagnosis not present

## 2022-02-18 DIAGNOSIS — Z853 Personal history of malignant neoplasm of breast: Secondary | ICD-10-CM | POA: Diagnosis not present

## 2022-02-18 DIAGNOSIS — Z170 Estrogen receptor positive status [ER+]: Secondary | ICD-10-CM | POA: Diagnosis not present

## 2022-02-18 DIAGNOSIS — Z421 Encounter for breast reconstruction following mastectomy: Secondary | ICD-10-CM | POA: Diagnosis not present

## 2022-02-18 DIAGNOSIS — Y92230 Patient room in hospital as the place of occurrence of the external cause: Secondary | ICD-10-CM | POA: Diagnosis not present

## 2022-02-18 DIAGNOSIS — N6489 Other specified disorders of breast: Secondary | ICD-10-CM | POA: Diagnosis not present

## 2022-02-24 ENCOUNTER — Telehealth: Payer: Self-pay | Admitting: *Deleted

## 2022-02-24 ENCOUNTER — Encounter: Payer: Self-pay | Admitting: *Deleted

## 2022-02-24 NOTE — Patient Outreach (Signed)
  Care Coordination [facility] Note Transition Care Management Follow-up Telephone Call Date of discharge and from where: Sunday, 02/22/22, [facility]; (L) breast CA, plastic/ reconstructive surgery How have you been since you were released from the hospital? "I am doing great.... 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  Not having any problems managing the JP drains and I go back to the surgeon at [facility] on Friday.  My family is right here if I need anything, but I am able to do everything on my own.  I am not even having to use the pain medication very much-- tylenol is actually helping me a lot.  My appetite is good and there were no changes to my regular medication.  I do have the muscle relaxer and the narcotic pain medicine, just in case I need them, but I am not having to take them much at all." Any questions or concerns? No  Items Reviewed: Did the pt receive and understand the discharge instructions provided? Yes  Medications obtained and verified? Yes  Other? No  Any new allergies since your discharge? No  Dietary orders reviewed? Yes Do you have support at home? Yes  family providing assistance with care needs as indicated; patient reports she is completely independent in self-care  Home Care and Equipment/Supplies: Were home health services ordered? no If so, what is the name of the agency? N/A  Has the agency set up a time to come to the patient's home? not applicable Were any new equipment or medical supplies ordered?  No What is the name of the medical supply agency? N/A Were you able to get the supplies/equipment? not applicable Do you have any questions related to the use of the equipment or supplies? No  Functional Questionnaire: (I = Independent and D = Dependent) ADLs: I  family providing assistance with care needs as indicated  Bathing/Dressing- I  Meal Prep- I  Eating- I  Maintaining  continence- I  Transferring/Ambulation- I  Managing Meds- I  Follow up appointments reviewed:  PCP Hospital f/u appt confirmed? No  Scheduled to see - on - @ [facility] f/u appt confirmed? Yes  Scheduled to see surgeon at [place] [facility] on Friday 02/27/22 @ 10:00 am Are transportation arrangements needed? No  If their condition worsens, is the pt aware to call PCP or go to the Emergency Dept.? Yes Was the patient provided with contact information for the PCP's office or ED? No- patient declined; reports already has contact information for all care providers Was to pt encouraged to call back with questions or concerns? Yes  SDOH assessments and interventions completed:   Yes SDOH Interventions Today    Flowsheet Row Most Recent Value  SDOH Interventions   Food Insecurity Interventions Intervention Not Indicated  Transportation Interventions Intervention Not Indicated  [husband providing transportation after recent surgery,  normally drives self]      Care Coordination Interventions:  Reviewed/ reinforced post-op care at home of JP drains    Encounter Outcome:  Pt. Visit Completed    Oneta Rack, RN, BSN, CCRN Alumnus RN CM Care Coordination/ Transition of [place] Management [phone]: direct office

## 2022-03-02 DIAGNOSIS — T85898A Other specified complication of other internal prosthetic devices, implants and grafts, initial encounter: Secondary | ICD-10-CM | POA: Diagnosis not present

## 2022-03-02 DIAGNOSIS — K8080 Other cholelithiasis without obstruction: Secondary | ICD-10-CM | POA: Diagnosis not present

## 2022-03-02 DIAGNOSIS — N641 Fat necrosis of breast: Secondary | ICD-10-CM | POA: Diagnosis not present

## 2022-03-02 DIAGNOSIS — J9811 Atelectasis: Secondary | ICD-10-CM | POA: Diagnosis not present

## 2022-03-02 DIAGNOSIS — Z888 Allergy status to other drugs, medicaments and biological substances status: Secondary | ICD-10-CM | POA: Diagnosis not present

## 2022-03-02 DIAGNOSIS — X58XXXA Exposure to other specified factors, initial encounter: Secondary | ICD-10-CM | POA: Diagnosis not present

## 2022-03-02 DIAGNOSIS — Z7984 Long term (current) use of oral hypoglycemic drugs: Secondary | ICD-10-CM | POA: Diagnosis not present

## 2022-03-02 DIAGNOSIS — Y838 Other surgical procedures as the cause of abnormal reaction of the patient, or of later complication, without mention of misadventure at the time of the procedure: Secondary | ICD-10-CM | POA: Diagnosis not present

## 2022-03-02 DIAGNOSIS — R162 Hepatomegaly with splenomegaly, not elsewhere classified: Secondary | ICD-10-CM | POA: Diagnosis not present

## 2022-03-02 DIAGNOSIS — Z91018 Allergy to other foods: Secondary | ICD-10-CM | POA: Diagnosis not present

## 2022-03-02 DIAGNOSIS — Z79811 Long term (current) use of aromatase inhibitors: Secondary | ICD-10-CM | POA: Diagnosis not present

## 2022-03-02 DIAGNOSIS — Z9013 Acquired absence of bilateral breasts and nipples: Secondary | ICD-10-CM | POA: Diagnosis not present

## 2022-03-02 DIAGNOSIS — T8579XA Infection and inflammatory reaction due to other internal prosthetic devices, implants and grafts, initial encounter: Secondary | ICD-10-CM | POA: Diagnosis not present

## 2022-03-02 DIAGNOSIS — D259 Leiomyoma of uterus, unspecified: Secondary | ICD-10-CM | POA: Diagnosis not present

## 2022-03-02 DIAGNOSIS — T8141XA Infection following a procedure, superficial incisional surgical site, initial encounter: Secondary | ICD-10-CM | POA: Diagnosis not present

## 2022-03-02 DIAGNOSIS — Y999 Unspecified external cause status: Secondary | ICD-10-CM | POA: Diagnosis not present

## 2022-03-02 DIAGNOSIS — N611 Abscess of the breast and nipple: Secondary | ICD-10-CM | POA: Diagnosis not present

## 2022-03-02 DIAGNOSIS — Z853 Personal history of malignant neoplasm of breast: Secondary | ICD-10-CM | POA: Diagnosis not present

## 2022-03-02 DIAGNOSIS — Z808 Family history of malignant neoplasm of other organs or systems: Secondary | ICD-10-CM | POA: Diagnosis not present

## 2022-03-02 DIAGNOSIS — L03311 Cellulitis of abdominal wall: Secondary | ICD-10-CM | POA: Diagnosis not present

## 2022-03-02 DIAGNOSIS — M96843 Postprocedural seroma of a musculoskeletal structure following other procedure: Secondary | ICD-10-CM | POA: Diagnosis not present

## 2022-03-02 DIAGNOSIS — Z91040 Latex allergy status: Secondary | ICD-10-CM | POA: Diagnosis not present

## 2022-03-02 DIAGNOSIS — T8140XA Infection following a procedure, unspecified, initial encounter: Secondary | ICD-10-CM | POA: Diagnosis not present

## 2022-03-02 DIAGNOSIS — Z87891 Personal history of nicotine dependence: Secondary | ICD-10-CM | POA: Diagnosis not present

## 2022-03-02 DIAGNOSIS — K5730 Diverticulosis of large intestine without perforation or abscess without bleeding: Secondary | ICD-10-CM | POA: Diagnosis not present

## 2022-03-02 DIAGNOSIS — L7632 Postprocedural hematoma of skin and subcutaneous tissue following other procedure: Secondary | ICD-10-CM | POA: Diagnosis not present

## 2022-03-03 DIAGNOSIS — L7632 Postprocedural hematoma of skin and subcutaneous tissue following other procedure: Secondary | ICD-10-CM | POA: Diagnosis not present

## 2022-03-03 DIAGNOSIS — N641 Fat necrosis of breast: Secondary | ICD-10-CM | POA: Diagnosis not present

## 2022-03-13 ENCOUNTER — Telehealth: Payer: Self-pay

## 2022-03-13 NOTE — Telephone Encounter (Signed)
Faxed pathology report and last office note to [facility] as per the request.

## 2022-03-20 DIAGNOSIS — R7303 Prediabetes: Secondary | ICD-10-CM | POA: Diagnosis not present

## 2022-03-20 DIAGNOSIS — R7989 Other specified abnormal findings of blood chemistry: Secondary | ICD-10-CM | POA: Diagnosis not present

## 2022-03-20 DIAGNOSIS — E785 Hyperlipidemia, unspecified: Secondary | ICD-10-CM | POA: Diagnosis not present

## 2022-03-23 DIAGNOSIS — D509 Iron deficiency anemia, unspecified: Secondary | ICD-10-CM | POA: Diagnosis not present

## 2022-03-23 DIAGNOSIS — D649 Anemia, unspecified: Secondary | ICD-10-CM | POA: Diagnosis not present

## 2022-03-26 DIAGNOSIS — R82998 Other abnormal findings in urine: Secondary | ICD-10-CM | POA: Diagnosis not present

## 2022-03-26 DIAGNOSIS — E1169 Type 2 diabetes mellitus with other specified complication: Secondary | ICD-10-CM | POA: Diagnosis not present

## 2022-03-26 DIAGNOSIS — Z0000 Encounter for general adult medical examination without abnormal findings: Secondary | ICD-10-CM | POA: Diagnosis not present

## 2022-05-05 ENCOUNTER — Other Ambulatory Visit: Payer: Self-pay | Admitting: Hematology

## 2022-05-15 DIAGNOSIS — C50412 Malignant neoplasm of upper-outer quadrant of left female breast: Secondary | ICD-10-CM | POA: Diagnosis not present

## 2022-06-23 DIAGNOSIS — Z01818 Encounter for other preprocedural examination: Secondary | ICD-10-CM | POA: Diagnosis not present

## 2022-06-23 DIAGNOSIS — Z419 Encounter for procedure for purposes other than remedying health state, unspecified: Secondary | ICD-10-CM | POA: Diagnosis not present

## 2022-07-06 ENCOUNTER — Encounter: Payer: Self-pay | Admitting: Adult Health

## 2022-07-06 ENCOUNTER — Inpatient Hospital Stay (HOSPITAL_BASED_OUTPATIENT_CLINIC_OR_DEPARTMENT_OTHER): Payer: Federal, State, Local not specified - PPO | Admitting: Adult Health

## 2022-07-06 ENCOUNTER — Other Ambulatory Visit: Payer: Self-pay

## 2022-07-06 ENCOUNTER — Inpatient Hospital Stay: Payer: Federal, State, Local not specified - PPO | Attending: Adult Health

## 2022-07-06 ENCOUNTER — Other Ambulatory Visit: Payer: Self-pay | Admitting: Adult Health

## 2022-07-06 VITALS — BP 140/79 | HR 76 | Temp 98.1°F | Resp 18 | Wt 187.3 lb

## 2022-07-06 DIAGNOSIS — D509 Iron deficiency anemia, unspecified: Secondary | ICD-10-CM | POA: Insufficient documentation

## 2022-07-06 DIAGNOSIS — Z811 Family history of alcohol abuse and dependence: Secondary | ICD-10-CM | POA: Diagnosis not present

## 2022-07-06 DIAGNOSIS — Z87891 Personal history of nicotine dependence: Secondary | ICD-10-CM | POA: Insufficient documentation

## 2022-07-06 DIAGNOSIS — Z818 Family history of other mental and behavioral disorders: Secondary | ICD-10-CM | POA: Diagnosis not present

## 2022-07-06 DIAGNOSIS — Z170 Estrogen receptor positive status [ER+]: Secondary | ICD-10-CM | POA: Diagnosis not present

## 2022-07-06 DIAGNOSIS — Z7984 Long term (current) use of oral hypoglycemic drugs: Secondary | ICD-10-CM | POA: Insufficient documentation

## 2022-07-06 DIAGNOSIS — Z833 Family history of diabetes mellitus: Secondary | ICD-10-CM | POA: Diagnosis not present

## 2022-07-06 DIAGNOSIS — Z79811 Long term (current) use of aromatase inhibitors: Secondary | ICD-10-CM | POA: Diagnosis not present

## 2022-07-06 DIAGNOSIS — K769 Liver disease, unspecified: Secondary | ICD-10-CM | POA: Diagnosis not present

## 2022-07-06 DIAGNOSIS — Z8249 Family history of ischemic heart disease and other diseases of the circulatory system: Secondary | ICD-10-CM | POA: Diagnosis not present

## 2022-07-06 DIAGNOSIS — E119 Type 2 diabetes mellitus without complications: Secondary | ICD-10-CM | POA: Insufficient documentation

## 2022-07-06 DIAGNOSIS — Z825 Family history of asthma and other chronic lower respiratory diseases: Secondary | ICD-10-CM | POA: Insufficient documentation

## 2022-07-06 DIAGNOSIS — Z823 Family history of stroke: Secondary | ICD-10-CM | POA: Diagnosis not present

## 2022-07-06 DIAGNOSIS — K760 Fatty (change of) liver, not elsewhere classified: Secondary | ICD-10-CM | POA: Insufficient documentation

## 2022-07-06 DIAGNOSIS — Z8349 Family history of other endocrine, nutritional and metabolic diseases: Secondary | ICD-10-CM | POA: Insufficient documentation

## 2022-07-06 DIAGNOSIS — Z90721 Acquired absence of ovaries, unilateral: Secondary | ICD-10-CM | POA: Insufficient documentation

## 2022-07-06 DIAGNOSIS — C50412 Malignant neoplasm of upper-outer quadrant of left female breast: Secondary | ICD-10-CM | POA: Insufficient documentation

## 2022-07-06 DIAGNOSIS — Z807 Family history of other malignant neoplasms of lymphoid, hematopoietic and related tissues: Secondary | ICD-10-CM | POA: Diagnosis not present

## 2022-07-06 DIAGNOSIS — R5383 Other fatigue: Secondary | ICD-10-CM | POA: Diagnosis not present

## 2022-07-06 DIAGNOSIS — Z79899 Other long term (current) drug therapy: Secondary | ICD-10-CM | POA: Insufficient documentation

## 2022-07-06 DIAGNOSIS — Z87442 Personal history of urinary calculi: Secondary | ICD-10-CM | POA: Diagnosis not present

## 2022-07-06 DIAGNOSIS — K8020 Calculus of gallbladder without cholecystitis without obstruction: Secondary | ICD-10-CM | POA: Insufficient documentation

## 2022-07-06 DIAGNOSIS — Z841 Family history of disorders of kidney and ureter: Secondary | ICD-10-CM | POA: Insufficient documentation

## 2022-07-06 DIAGNOSIS — Z808 Family history of malignant neoplasm of other organs or systems: Secondary | ICD-10-CM | POA: Diagnosis not present

## 2022-07-06 DIAGNOSIS — D500 Iron deficiency anemia secondary to blood loss (chronic): Secondary | ICD-10-CM

## 2022-07-06 LAB — VITAMIN B12: Vitamin B-12: 186 pg/mL (ref 180–914)

## 2022-07-06 LAB — CBC WITH DIFFERENTIAL (CANCER CENTER ONLY)
Abs Immature Granulocytes: 0.04 10*3/uL (ref 0.00–0.07)
Basophils Absolute: 0 10*3/uL (ref 0.0–0.1)
Basophils Relative: 1 %
Eosinophils Absolute: 0.2 10*3/uL (ref 0.0–0.5)
Eosinophils Relative: 3 %
HCT: 38.7 % (ref 36.0–46.0)
Hemoglobin: 11.4 g/dL — ABNORMAL LOW (ref 12.0–15.0)
Immature Granulocytes: 1 %
Lymphocytes Relative: 25 %
Lymphs Abs: 1.9 10*3/uL (ref 0.7–4.0)
MCH: 21.9 pg — ABNORMAL LOW (ref 26.0–34.0)
MCHC: 29.5 g/dL — ABNORMAL LOW (ref 30.0–36.0)
MCV: 74.4 fL — ABNORMAL LOW (ref 80.0–100.0)
Monocytes Absolute: 0.5 10*3/uL (ref 0.1–1.0)
Monocytes Relative: 7 %
Neutro Abs: 4.8 10*3/uL (ref 1.7–7.7)
Neutrophils Relative %: 63 %
Platelet Count: 356 10*3/uL (ref 150–400)
RBC: 5.2 MIL/uL — ABNORMAL HIGH (ref 3.87–5.11)
RDW: 19.9 % — ABNORMAL HIGH (ref 11.5–15.5)
WBC Count: 7.5 10*3/uL (ref 4.0–10.5)
nRBC: 0 % (ref 0.0–0.2)

## 2022-07-06 LAB — CMP (CANCER CENTER ONLY)
ALT: 36 U/L (ref 0–44)
AST: 25 U/L (ref 15–41)
Albumin: 4.4 g/dL (ref 3.5–5.0)
Alkaline Phosphatase: 102 U/L (ref 38–126)
Anion gap: 9 (ref 5–15)
BUN: 13 mg/dL (ref 6–20)
CO2: 28 mmol/L (ref 22–32)
Calcium: 10.2 mg/dL (ref 8.9–10.3)
Chloride: 104 mmol/L (ref 98–111)
Creatinine: 0.77 mg/dL (ref 0.44–1.00)
GFR, Estimated: >60 mL/min (ref >60)
Glucose, Bld: 155 mg/dL — ABNORMAL HIGH (ref 70–99)
Potassium: 4.3 mmol/L (ref 3.5–5.1)
Sodium: 141 mmol/L (ref 135–145)
Total Bilirubin: 0.3 mg/dL (ref 0.3–1.2)
Total Protein: 7.5 g/dL (ref 6.5–8.1)

## 2022-07-06 LAB — FERRITIN: Ferritin: 8 ng/mL — ABNORMAL LOW (ref 11–307)

## 2022-07-06 LAB — IRON AND IRON BINDING CAPACITY (CC-WL,HP ONLY)
Iron: 38 ug/dL (ref 28–170)
Saturation Ratios: 7 % — ABNORMAL LOW (ref 10.4–31.8)
TIBC: 519 ug/dL — ABNORMAL HIGH (ref 250–450)
UIBC: 481 ug/dL — ABNORMAL HIGH (ref 148–442)

## 2022-07-06 NOTE — Progress Notes (Signed)
[facility] Cancer Follow up:    Diana Ferretti, DO [address] [place] [address]   DIAGNOSIS:  Cancer Staging  Malignant neoplasm of upper-outer quadrant of left breast in female, estrogen receptor positive (HCC) Staging form: Breast, AJCC 8th Edition - Clinical stage from 01/21/2021: Stage IB (cT1c, cN1, cM0, G2, ER+, PR+, HER2-) - Signed by Malachy Mood, MD on 02/04/2021 Stage prefix: Initial diagnosis Histologic grading system: 3 grade system - Pathologic stage from 04/21/2021: Stage IA (pT1c, pN1(sn), cM0, G2, ER+, PR+, HER2-) - Signed by Ronny Bacon, PA-C on 04/21/2021 Stage prefix: Initial diagnosis Method of lymph node assessment: Sentinel lymph node biopsy Multigene prognostic tests performed: MammaPrint Histologic grading system: 3 grade system   SUMMARY OF ONCOLOGIC HISTORY: Oncology History Overview Note   Cancer Staging  Malignant neoplasm of upper-outer quadrant of left breast in female, estrogen receptor positive (HCC) Staging form: Breast, AJCC 8th Edition - Clinical stage from 01/21/2021: Stage IB (cT1c, cN1, cM0, G2, ER+, PR+, HER2-) - Signed by Malachy Mood, MD on 02/04/2021 - Pathologic stage from 04/21/2021: Stage IA (pT1c, pN1(sn), cM0, G2, ER+, PR+, HER2-) - Signed by Ronny Bacon, PA-C on 04/21/2021     Malignant neoplasm of upper-outer quadrant of left breast in female, estrogen receptor positive (HCC)  01/14/2021 Mammogram   EXAM: DIGITAL DIAGNOSTIC BILATERAL MAMMOGRAM WITH TOMOSYNTHESIS AND CAD; ULTRASOUND LEFT BREAST LIMITED  IMPRESSION: 1. Suspicious left breast mass at the 2:30 position 9 cm from the nipple corresponding with the screening mammographic findings. Recommendation is for ultrasound-guided biopsy. 2. Single, morphologically abnormal left axillary lymph node. Recommendation is for ultrasound-guided biopsy. 3. No persistent, suspicious findings in the right breast.   01/21/2021 Cancer Staging   Staging  form: Breast, AJCC 8th Edition - Clinical stage from 01/21/2021: Stage IB (cT1c, cN1, cM0, G2, ER+, PR+, HER2-) - Signed by Malachy Mood, MD on 02/04/2021 Stage prefix: Initial diagnosis Histologic grading system: 3 grade system   01/21/2021 Pathology Results   Diagnosis 1. Breast, left, needle core biopsy, 2:30 o'clock, ribbon, 9cmfn - INVASIVE DUCTAL CARCINOMA - DUCTAL CARCINOMA IN SITU - SEE COMMENT 2. Lymph node, needle/core biopsy, left axillary node, tribell - METASTATIC CARCINOMA INVOLVING NODAL TISSUE Microscopic Comment 1. Based on the biopsy, the carcinoma appears Nottingham grade 2 of 3 and measures 1.1 cm in greatest linear extent.  1. PROGNOSTIC INDICATORS Results: The tumor cells are EQUIVOCAL for Her2 (2+). Her2 by FISH will be performed and results reported separately. Estrogen Receptor: 100%, POSITIVE, STRONG STAINING INTENSITY Progesterone Receptor: 100%, POSITIVE, STRONG STAINING INTENSITY Proliferation Marker Ki67: 15% 1. FLUORESCENCE IN-SITU HYBRIDIZATION Results: GROUP 5: HER2 **NEGATIVE**  2. PROGNOSTIC INDICATORS Results: The tumor cells are EQUIVOCAL for Her2 (2+). Her2 by FISH will be performed and results reported separately Estrogen Receptor: 100%, POSITIVE, STRONG STAINING INTENSITY Progesterone Receptor: 100%, POSITIVE, STRONG STAINING INTENSITY 2. FLUORESCENCE IN-SITU HYBRIDIZATION Results: GROUP 5: HER2 **NEGATIVE**   01/21/2021 Miscellaneous   Mammaprint: LOW RISK   02/04/2021 Initial Diagnosis   Malignant neoplasm of upper-outer quadrant of left breast in female, estrogen receptor positive (HCC)   02/04/2021 Imaging   EXAM: BILATERAL BREAST MRI WITH AND WITHOUT CONTRAST  IMPRESSION: 1. Known malignancy in the LATERAL portion of the LEFT breast measures 3.0 centimeters on MRI. 2. Single enlarged LEFT axillary lymph node represents the recently biopsied node with metastatic carcinoma. 3. Two additional suspicious masses in the UPPER  central LEFT breast warranting tissue diagnosis. Given the very heterogeneous appearance of the LEFT  breast, with numerous cysts, MR guided core biopsy is indicated for optimal targeting. 4. RIGHT breast is negative.   02/13/2021 Pathology Results   Diagnosis 1. Breast, left, needle core biopsy, upper outer quadrant(anterior lesion) - FOCAL INTRAVASCULAR TUMOR. - DUCTAL PAPILLOMA. - COLUMNAR CELL CHANGE WITH CALCIFICATIONS. - PSEUDOANGIOMATOUS STROMAL HYPERPLASIA (PASH). - SEE MICROSCOPIC DESCRIPTION. 2. Breast, left, needle core biopsy, upper outer quadrant(posterior lesion) - INVASIVE AND IN SITU DUCTAL CARCINOMA, GRADE 2. - SEE MICROSCOPIC DESCRIPTION.   02/17/2021 Genetic Testing   Negative hereditary cancer genetic testing: no pathogenic variants detected in [facility] CancerNext-Expanded +RNAinsight Panel.  The report date is 02/17/2021.    The CancerNext-Expanded gene panel offered by [facility] and includes sequencing, rearrangement, and RNA analysis for the following 77 genes: AIP, ALK, APC, ATM, AXIN2, BAP1, BARD1, BLM, BMPR1A, BRCA1, BRCA2, BRIP1, CDC73, CDH1, CDK4, CDKN1B, CDKN2A, CHEK2, CTNNA1, DICER1, FANCC, FH, FLCN, GALNT12, KIF1B, LZTR1, MAX, MEN1, MET, MLH1, MSH2, MSH3, MSH6, MUTYH, NBN, NF1, NF2, NTHL1, PALB2, PHOX2B, PMS2, POT1, PRKAR1A, PTCH1, PTEN, RAD51C, RAD51D, RB1, RECQL, RET, SDHA, SDHAF2, SDHB, SDHC, SDHD, SMAD4, SMARCA4, SMARCB1, SMARCE1, STK11, SUFU, TMEM127, TP53, TSC1, TSC2, VHL and XRCC2 (sequencing and deletion/duplication); EGFR, EGLN1, HOXB13, KIT, MITF, PDGFRA, POLD1, and POLE (sequencing only); EPCAM and GREM1 (deletion/duplication only).    04/21/2021 Cancer Staging   Staging form: Breast, AJCC 8th Edition - Pathologic stage from 04/21/2021: Stage IA (pT1c, pN1(sn), cM0, G2, ER+, PR+, HER2-) - Signed by Ronny Bacon, PA-C on 04/21/2021 Stage prefix: Initial diagnosis Method of lymph node assessment: Sentinel lymph node biopsy Multigene prognostic  tests performed: MammaPrint Histologic grading system: 3 grade system    Definitive Surgery   FINAL MICROSCOPIC DIAGNOSIS:   A. OVARY AND FALLOPIAN TUBE, RIGHT:  -  Benign ovary  -  Benign fallopian tube with paratubal cyst(s)  -  No malignancy identified   B. OVARY AND FALLOPIAN TUBE, LEFT:  -  Endometriosis involving fallopian tube  -  Benign ovary with a simple serous cyst  -  No malignancy identified   C. LYMPH NODE, LEFT AXILLARY #2, SENTINEL, EXCISION:  -  Metastatic carcinoma involving one lymph node (1/1)  -  No extracapsular extension identified   D. BREAST, RIGHT, MASTECTOMY:  -  Fibroadenomatoid nodule  -  Usual ductal hyperplasia, columnar cell and fibrocystic changes with calcifications  -  No malignancy identified   E. BREAST, LEFT, MASTECTOMY:  -  Invasive ductal carcinoma, Nottingham grade 2 of 3, 1.8 cm  -  Ductal carcinoma in-situ, intermediate grade  -  Calcifications associated with carcinoma  -  Margins uninvolved by carcinoma  -  Previous biopsy site changes present (x3)  -  See oncology table and comment below   F. LYMPH NODE, LEFT AXILLARY #1, SENTINEL, EXCISION:  -  No carcinoma identified in one lymph node (0/1)   G. LYMPH NODE, LEFT AXILLARY #3, SENTINEL, EXCISION:  -  No carcinoma identified in one lymph node (0/1)   H. LYMPH NODE, LEFT AXILLARY #4, SENTINEL, EXCISION:  -  No carcinoma identified in one lymph node (0/1)   I. LYMPH NODE, LEFT AXILLARY #5, SENTINEL, EXCISION:  -  No carcinoma identified in one lymph node (0/1)    06/17/2021 - 07/31/2021 Radiation Therapy   Site Technique Total Dose (Gy) Dose per Fx (Gy) Completed Fx Beam Energies  Chest Wall, Left: CW_L 3D 50.4/50.4 1.8 28/28 10X  Chest Wall, Left: CW_L_SCLV 3D 50.4/50.4 1.8 28/28 6X, 10X  Chest Wall, Left: CW_L_Bst Electron 10/10  2 5/5 9E     08/2021 -  Anti-estrogen oral therapy   Letrozole     CURRENT THERAPY: Letrozole  INTERVAL HISTORY: Diana Weaver 47 y.o.  female returns for follow-up of her history of breast cancer.  She continues on letrozole daily with good tolerance.  Her hemoglobin today is 11.4.  She tells me that over Christmas when she underwent reconstruction she had many complications from an infection which led to increased blood loss and her hemoglobin is actually improved from what it was before.  She has been eating iron rich foods since that time and her hemoglobin is better.  She is fatigued slightly has upcoming reconstruction next week.  Also notes that she has been diagnosed with type 2 diabetes and began metformin twice daily.  In addition to all this she lost her job in February due to layoffs.  She notes that the past several months have been stressful for her and she is taking it 1 day at a time.  He underwent MRI of the liver in November 2023 that demonstrated a stable 1.9 cm hypervascular lesion in the posterior right liver with indeterminate imaging features.  Repeat MRI was recommended in 6 to 12 months to ensure stability.  Hepatic steatosis and cholelithiasis was also noted on this exam.  She also underwent bone density testing on October 30, 2021 that was normal.  Patient Active Problem List   Diagnosis Date Noted   Diabetes mellitus type 2, uncomplicated (HCC) 07/06/2022   Genetic testing 02/18/2021   Family history of melanoma 02/05/2021   Malignant neoplasm of upper-outer quadrant of left breast in female, estrogen receptor positive (HCC) 02/04/2021   Obesity (BMI 30-39.9) 05/10/2018   Premature contraction 05/10/2018    is allergic to betadine [povidone-iodine], latex, and pistachio nut (diagnostic).  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     LAPAROSCOPY N/A 03/27/2021   Procedure: DIAGNOSTIC LAPAROSCOPY ;  Surgeon: Harold Hedge, MD;  Location[facility] OR;  Service: Gynecology;  Laterality: N/A;   LAPAROTOMY N/A 03/27/2021   Procedure: MINI LAPAROTOMY;  Surgeon: Harold Hedge, MD;  Location: [facility] OR;  Service: Gynecology;  Laterality: N/A;   MASTECTOMY W/ SENTINEL NODE BIOPSY Left 03/27/2021   Procedure: LEFT MASTECTOMY WITH SENTINEL LYMPH NODE BIOPSY;  Surgeon: Almond Lint, MD;  Location: [facility] OR;  Service: General;  Laterality: Left;   RADIOACTIVE SEED GUIDED AXILLARY SENTINEL LYMPH NODE Left 03/27/2021   Procedure: RADIOACTIVE SEED GUIDED LEFT AXILLARY SENTINEL LYMPH NODE BIOPSY;  Surgeon: Almond Lint, MD;  Location: [facility] OR;  Service: General;  Laterality: Left;   SALPINGOOPHORECTOMY Bilateral 03/27/2021   Procedure: OPEN BILATERAL SALPINGO OOPHORECTOMY;  Surgeon: Harold Hedge, MD;  Location: [facility] OR;  Service: Gynecology;  Laterality: Bilateral;   TOTAL MASTECTOMY Right 03/27/2021   Procedure: RIGHT TOTAL MASTECTOMY;  Surgeon: Almond Lint, MD;  Location: [facility] OR;  Service: General;  Laterality: Right;    SOCIAL HISTORY: Social History   Socioeconomic History   Marital status: Married    Spouse name: Not on file   Number of children: 1   Years of education: Not on file   Highest education level: Not  on file  Occupational History   Occupation: Building services engineer  Tobacco Use   Smoking status: Former    Packs/day: 0.25    Years: 10.00    Additional pack years: 0.00    Total pack years: 2.50    Types: Cigarettes    Quit date: 03/09/2009    Years since quitting: 13.3   Smokeless tobacco: Never  Vaping Use   Vaping Use: Never used  Substance and Sexual Activity   Alcohol use: Yes    Comment: 2-4 drinks per year   Drug use: Never   Sexual activity: Yes    Birth control/protection: Condom  Other Topics Concern   Not on file  Social History Narrative   Not on file   Social  Determinants of Health   Financial Resource Strain: Not on file  Food Insecurity: No Food Insecurity (02/24/2022)   Hunger Vital Sign    Worried About Running Out of Food in the Last Year: Never true    Ran Out of Food in the Last Year: Never true  Transportation Needs: No Transportation Needs (02/24/2022)   PRAPARE - Administrator, Civil Service (Medical): No    Lack of Transportation (Non-Medical): No  Physical Activity: Not on file  Stress: Not on file  Social Connections: Not on file  Intimate Partner Violence: Not At Risk (02/18/2021)   Humiliation, Afraid, Rape, and Kick questionnaire    Fear of Current or Ex-Partner: No    Emotionally Abused: No    Physically Abused: No    Sexually Abused: No    FAMILY HISTORY: Family History  Problem Relation Age of Onset   Hypertension Mother    Hyperlipidemia Mother    Melanoma Mother 13       forehead; surgery only   Depression Father    Hyperlipidemia Father    Hypertension Father    Alcohol abuse Father    Early death Father    Asthma Sister    Depression Sister    Diabetes Sister    Kidney disease Sister    Obesity Sister    Alcohol abuse Maternal Grandfather    Stroke Maternal Grandfather    Cancer Maternal Grandfather        oral cancer; d. early 55s   Hodgkin's lymphoma Paternal Grandfather        d. <60?   Throat cancer Cousin        maternal female cousin; dx 38s    Review of Systems  Constitutional:  Positive for fatigue. Negative for appetite change, chills, fever and unexpected weight change.  HENT:   Negative for hearing loss, lump/mass and trouble swallowing.   Eyes:  Negative for eye problems and icterus.  Respiratory:  Negative for chest tightness, cough and shortness of breath.   Cardiovascular:  Negative for chest pain, leg swelling and palpitations.  Gastrointestinal:  Negative for abdominal distention, abdominal pain, constipation, diarrhea, nausea and vomiting.  Endocrine: Negative for  hot flashes.  Genitourinary:  Negative for difficulty urinating.   Musculoskeletal:  Negative for arthralgias.  Skin:  Negative for itching and rash.  Neurological:  Negative for dizziness, extremity weakness, headaches and numbness.  Hematological:  Negative for adenopathy. Does not bruise/bleed easily.  Psychiatric/Behavioral:  Negative for depression. The patient is not nervous/anxious.       PHYSICAL EXAMINATION   Onc Performance Status - 07/06/22 0911       ECOG Perf Status   ECOG Perf Status Fully active, able to carry  on all pre-disease performance without restriction      KPS SCALE   KPS % SCORE Normal, no compliants, no evidence of disease             Vitals:   07/06/22 0907  BP: (!) 140/79  Pulse: 76  Resp: 18  Temp: 98.1 F (36.7 C)  SpO2: 99%    Physical Exam Constitutional:      General: She is not in acute distress.    Appearance: Normal appearance. She is not toxic-appearing.  HENT:     Head: Normocephalic and atraumatic.  Eyes:     General: No scleral icterus. Cardiovascular:     Rate and Rhythm: Normal rate and regular rhythm.     Pulses: Normal pulses.     Heart sounds: Normal heart sounds.  Pulmonary:     Effort: Pulmonary effort is normal.     Breath sounds: Normal breath sounds.  Chest:     Comments: Right breast status postmastectomy no sign of local recurrence left breast status postmastectomy and reconstruction and radiation, no sign of local recurrence. Abdominal:     General: Abdomen is flat. Bowel sounds are normal. There is no distension.     Palpations: Abdomen is soft.     Tenderness: There is no abdominal tenderness.  Musculoskeletal:        General: No swelling.     Cervical back: Neck supple.  Lymphadenopathy:     Cervical: No cervical adenopathy.  Skin:    General: Skin is warm and dry.     Findings: No rash.  Neurological:     General: No focal deficit present.     Mental Status: She is alert.  Psychiatric:         Mood and Affect: Mood normal.        Behavior: Behavior normal.     LABORATORY DATA:  CBC    Component Value Date/Time   WBC 7.5 07/06/2022 0847   WBC 7.1 01/01/2022 0922   RBC 5.20 (H) 07/06/2022 0847   HGB 11.4 (L) 07/06/2022 0847   HCT 38.7 07/06/2022 0847   PLT 356 07/06/2022 0847   MCV 74.4 (L) 07/06/2022 0847   MCH 21.9 (L) 07/06/2022 0847   MCHC 29.5 (L) 07/06/2022 0847   RDW 19.9 (H) 07/06/2022 0847   LYMPHSABS 1.9 07/06/2022 0847   MONOABS 0.5 07/06/2022 0847   EOSABS 0.2 07/06/2022 0847   BASOSABS 0.0 07/06/2022 0847    CMP     Component Value Date/Time   NA 141 07/06/2022 0847   K 4.3 07/06/2022 0847   CL 104 07/06/2022 0847   CO2 28 07/06/2022 0847   GLUCOSE 155 (H) 07/06/2022 0847   BUN 13 07/06/2022 0847   CREATININE 0.77 07/06/2022 0847   CALCIUM 10.2 07/06/2022 0847   PROT 7.5 07/06/2022 0847   ALBUMIN 4.4 07/06/2022 0847   AST 25 07/06/2022 0847   ALT 36 07/06/2022 0847   ALKPHOS 102 07/06/2022 0847   BILITOT 0.3 07/06/2022 0847   GFRNONAA >60 07/06/2022 0847         ASSESSMENT and THERAPY PLAN:   Malignant neoplasm of upper-outer quadrant of left breast in female, estrogen receptor positive (HCC) Diana Weaver is a 47 year old woman with history of 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  She will continue on letrozole daily as she is tolerating this well.  There is no role for breast imaging since she is status post bilateral mastectomy. Reconstruction difficulty and anemia: I have requested iron studies and B12 to further evaluate her anemia.  We will give her IV iron if indicated to optimize her hemoglobin and iron levels since she is going to have surgery in the  next few weeks. Liver lesion on MRI: This continues to be indeterminate but stable.  She would prefer to repeat liver MRI in 12 months which will be due in November.   Bone Health: Calcium, vitamin d, and weight bearing exercises recommended.  Repeat bone density testing due in 10/2023.    Return to clinic in 6 months with labs prior. We will f/u with her about iron if needed.       All questions were answered. The patient knows to call the clinic with any problems, questions or concerns. We can certainly see the patient much sooner if necessary.  Total encounter time:20 minutes*in face-to-face visit time, chart review, lab review, care coordination, order entry, and documentation of the encounter time.  Lillard Anes, NP 07/06/22 10:07 AM Medical Oncology and Hematology [facility] [address] [place], [place] [address] Tel. [phone]    Fax. [phone]  *Total Encounter Time as defined by the Centers for Medicare and Medicaid Services includes, in addition to the face-to-face time of a patient visit (documented in the note above) non-face-to-face time: obtaining and reviewing outside history, ordering and reviewing medications, tests or procedures, care coordination (communications with other health care professionals or caregivers) and documentation in the medical record.

## 2022-07-06 NOTE — Assessment & Plan Note (Signed)
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  She will continue on letrozole daily as she is tolerating this well.  There is no role for breast imaging since she is status post bilateral mastectomy. Reconstruction difficulty and anemia: I have requested iron studies and B12 to further evaluate her anemia.  We will give her IV iron if indicated to optimize her hemoglobin and iron levels since she is going to have surgery in the next few weeks. Liver lesion on MRI: This continues to be indeterminate but stable.  She would prefer to repeat liver MRI in 12 months which will be due in November.   Bone Health: Calcium, vitamin d, and weight bearing exercises recommended.  Repeat bone density testing due in 10/2023.    Return to clinic in 6 months with labs prior. We will f/u with her about iron if needed.

## 2022-07-06 NOTE — Progress Notes (Signed)
Ferritin 8, iron saturation 7%, has had several surgeries in the past few months--will administer IV iron with venofer 400mg  x2 (if possible for her to receive both before her surgery). Also with b12 deficiency, IM injections ordered.   Lillard Anes, NP 07/06/22 5:23 PM Medical Oncology and Hematology [facility] [address] [place], [place] [address] Tel. [phone]    Fax. [phone]

## 2022-07-07 ENCOUNTER — Telehealth: Payer: Self-pay | Admitting: *Deleted

## 2022-07-07 NOTE — Telephone Encounter (Signed)
Notified of message below. Does not have anyone who can give her the injection. Surgery is 1 week from Thursday, 5/9

## 2022-07-07 NOTE — Telephone Encounter (Signed)
-----   Message from Loa Socks, NP sent at 07/06/2022  5:16 PM EDT ----- Patient has b12 deficiency and iron deficiency. I am ordering IV iron. Please call patient and let her know she can get shot and iron x 1 prior to her surgery. Please ask her when her upcoming surgery is. Also--ask her if there is anyone who can give her IM injection for her b12? She may feel better faster if she gets a shot weekly x 4 then every 4 weeks.   Thanks, LC ----- Message ----- From: Leory Plowman, [facility] [place] Sent: 07/06/2022  10:54 AM EDT To: Loa Socks, NP

## 2022-07-09 ENCOUNTER — Encounter: Payer: Self-pay | Admitting: Adult Health

## 2022-07-09 ENCOUNTER — Telehealth: Payer: Self-pay | Admitting: Adult Health

## 2022-07-09 NOTE — Telephone Encounter (Signed)
Scheduled appointments per 4/29 los. Patient is aware of the made appointments. 

## 2022-07-10 ENCOUNTER — Telehealth: Payer: Self-pay | Admitting: *Deleted

## 2022-07-10 NOTE — Telephone Encounter (Signed)
Called to confirm IV iron for Monday 5/6 at 8 am. Pt verbalized understanding.

## 2022-07-13 ENCOUNTER — Other Ambulatory Visit: Payer: Self-pay

## 2022-07-13 ENCOUNTER — Inpatient Hospital Stay: Payer: Federal, State, Local not specified - PPO | Attending: Adult Health

## 2022-07-13 VITALS — BP 124/79 | HR 87 | Temp 98.2°F | Resp 18

## 2022-07-13 DIAGNOSIS — Z79899 Other long term (current) drug therapy: Secondary | ICD-10-CM | POA: Diagnosis not present

## 2022-07-13 DIAGNOSIS — Z170 Estrogen receptor positive status [ER+]: Secondary | ICD-10-CM | POA: Diagnosis not present

## 2022-07-13 DIAGNOSIS — K769 Liver disease, unspecified: Secondary | ICD-10-CM | POA: Insufficient documentation

## 2022-07-13 DIAGNOSIS — C50412 Malignant neoplasm of upper-outer quadrant of left female breast: Secondary | ICD-10-CM | POA: Diagnosis not present

## 2022-07-13 DIAGNOSIS — D500 Iron deficiency anemia secondary to blood loss (chronic): Secondary | ICD-10-CM

## 2022-07-13 DIAGNOSIS — E119 Type 2 diabetes mellitus without complications: Secondary | ICD-10-CM | POA: Insufficient documentation

## 2022-07-13 MED ORDER — SODIUM CHLORIDE 0.9 % IV SOLN: Freq: Once | INTRAVENOUS | Status: AC

## 2022-07-13 MED ORDER — SODIUM CHLORIDE 0.9 % IV SOLN
400.0000 mg | Freq: Once | INTRAVENOUS | Status: AC
  Administered 2022-07-13: 400 mg via INTRAVENOUS
  Filled 2022-07-13: qty 20

## 2022-07-13 MED ORDER — CYANOCOBALAMIN 1000 MCG/ML IJ SOLN
1000.0000 ug | Freq: Once | INTRAMUSCULAR | Status: AC
  Administered 2022-07-13: 1000 ug via INTRAMUSCULAR
  Filled 2022-07-13: qty 1

## 2022-07-16 DIAGNOSIS — Y849 Medical procedure, unspecified as the cause of abnormal reaction of the patient, or of later complication, without mention of misadventure at the time of the procedure: Secondary | ICD-10-CM | POA: Diagnosis not present

## 2022-07-16 DIAGNOSIS — Z421 Encounter for breast reconstruction following mastectomy: Secondary | ICD-10-CM | POA: Diagnosis not present

## 2022-07-16 DIAGNOSIS — R7309 Other abnormal glucose: Secondary | ICD-10-CM | POA: Diagnosis not present

## 2022-07-16 DIAGNOSIS — N6489 Other specified disorders of breast: Secondary | ICD-10-CM | POA: Diagnosis not present

## 2022-07-16 DIAGNOSIS — L905 Scar conditions and fibrosis of skin: Secondary | ICD-10-CM | POA: Diagnosis not present

## 2022-07-16 DIAGNOSIS — Z853 Personal history of malignant neoplasm of breast: Secondary | ICD-10-CM | POA: Diagnosis not present

## 2022-07-16 DIAGNOSIS — L910 Hypertrophic scar: Secondary | ICD-10-CM | POA: Diagnosis not present

## 2022-07-16 DIAGNOSIS — Z9013 Acquired absence of bilateral breasts and nipples: Secondary | ICD-10-CM | POA: Diagnosis not present

## 2022-07-16 DIAGNOSIS — L7682 Other postprocedural complications of skin and subcutaneous tissue: Secondary | ICD-10-CM | POA: Diagnosis not present

## 2022-07-17 DIAGNOSIS — Z853 Personal history of malignant neoplasm of breast: Secondary | ICD-10-CM | POA: Diagnosis not present

## 2022-07-17 DIAGNOSIS — L910 Hypertrophic scar: Secondary | ICD-10-CM | POA: Diagnosis not present

## 2022-07-17 DIAGNOSIS — Z9013 Acquired absence of bilateral breasts and nipples: Secondary | ICD-10-CM | POA: Diagnosis not present

## 2022-07-17 DIAGNOSIS — L905 Scar conditions and fibrosis of skin: Secondary | ICD-10-CM | POA: Diagnosis not present

## 2022-07-17 DIAGNOSIS — N6489 Other specified disorders of breast: Secondary | ICD-10-CM | POA: Diagnosis not present

## 2022-07-17 DIAGNOSIS — R7309 Other abnormal glucose: Secondary | ICD-10-CM | POA: Diagnosis not present

## 2022-07-17 DIAGNOSIS — L7682 Other postprocedural complications of skin and subcutaneous tissue: Secondary | ICD-10-CM | POA: Diagnosis not present

## 2022-07-17 DIAGNOSIS — Y849 Medical procedure, unspecified as the cause of abnormal reaction of the patient, or of later complication, without mention of misadventure at the time of the procedure: Secondary | ICD-10-CM | POA: Diagnosis not present

## 2022-07-18 DIAGNOSIS — R7309 Other abnormal glucose: Secondary | ICD-10-CM | POA: Diagnosis not present

## 2022-07-18 DIAGNOSIS — L910 Hypertrophic scar: Secondary | ICD-10-CM | POA: Diagnosis not present

## 2022-07-18 DIAGNOSIS — Z853 Personal history of malignant neoplasm of breast: Secondary | ICD-10-CM | POA: Diagnosis not present

## 2022-07-18 DIAGNOSIS — N6489 Other specified disorders of breast: Secondary | ICD-10-CM | POA: Diagnosis not present

## 2022-07-18 DIAGNOSIS — Z5189 Encounter for other specified aftercare: Secondary | ICD-10-CM | POA: Diagnosis not present

## 2022-07-18 DIAGNOSIS — L7682 Other postprocedural complications of skin and subcutaneous tissue: Secondary | ICD-10-CM | POA: Diagnosis not present

## 2022-07-18 DIAGNOSIS — L905 Scar conditions and fibrosis of skin: Secondary | ICD-10-CM | POA: Diagnosis not present

## 2022-07-18 DIAGNOSIS — Z9013 Acquired absence of bilateral breasts and nipples: Secondary | ICD-10-CM | POA: Diagnosis not present

## 2022-07-18 DIAGNOSIS — Y849 Medical procedure, unspecified as the cause of abnormal reaction of the patient, or of later complication, without mention of misadventure at the time of the procedure: Secondary | ICD-10-CM | POA: Diagnosis not present

## 2022-07-28 DIAGNOSIS — Z09 Encounter for follow-up examination after completed treatment for conditions other than malignant neoplasm: Secondary | ICD-10-CM | POA: Diagnosis not present

## 2022-07-31 DIAGNOSIS — Z09 Encounter for follow-up examination after completed treatment for conditions other than malignant neoplasm: Secondary | ICD-10-CM | POA: Diagnosis not present

## 2022-08-07 DIAGNOSIS — Z09 Encounter for follow-up examination after completed treatment for conditions other than malignant neoplasm: Secondary | ICD-10-CM | POA: Diagnosis not present

## 2022-09-04 ENCOUNTER — Other Ambulatory Visit: Payer: Self-pay | Admitting: Hematology

## 2022-09-08 DIAGNOSIS — C50912 Malignant neoplasm of unspecified site of left female breast: Secondary | ICD-10-CM | POA: Diagnosis not present

## 2022-09-08 DIAGNOSIS — Z9011 Acquired absence of right breast and nipple: Secondary | ICD-10-CM | POA: Diagnosis not present

## 2022-09-11 ENCOUNTER — Telehealth: Payer: Self-pay

## 2022-09-11 DIAGNOSIS — C50412 Malignant neoplasm of upper-outer quadrant of left female breast: Secondary | ICD-10-CM | POA: Diagnosis not present

## 2022-09-11 NOTE — Telephone Encounter (Signed)
Faxed signed orders for mastectomy products back to [facility] as per Dr. Mosetta Putt. Received fax confirmation of receipt. Placed originals in the to be scanned file.

## 2022-09-17 DIAGNOSIS — C50912 Malignant neoplasm of unspecified site of left female breast: Secondary | ICD-10-CM | POA: Diagnosis not present

## 2022-10-02 DIAGNOSIS — E1169 Type 2 diabetes mellitus with other specified complication: Secondary | ICD-10-CM | POA: Diagnosis not present

## 2022-12-15 DIAGNOSIS — Z01818 Encounter for other preprocedural examination: Secondary | ICD-10-CM | POA: Diagnosis not present

## 2022-12-25 ENCOUNTER — Ambulatory Visit (HOSPITAL_COMMUNITY)
Admission: RE | Admit: 2022-12-25 | Discharge: 2022-12-25 | Disposition: A | Discharge status: Home / Self Care | Payer: Federal, State, Local not specified - PPO | Source: Ambulatory Visit | Attending: Adult Health | Admitting: Adult Health

## 2022-12-25 ENCOUNTER — Encounter: Payer: Self-pay | Admitting: Adult Health

## 2022-12-25 DIAGNOSIS — K760 Fatty (change of) liver, not elsewhere classified: Secondary | ICD-10-CM | POA: Diagnosis not present

## 2022-12-25 DIAGNOSIS — C50412 Malignant neoplasm of upper-outer quadrant of left female breast: Secondary | ICD-10-CM | POA: Insufficient documentation

## 2022-12-25 DIAGNOSIS — K769 Liver disease, unspecified: Secondary | ICD-10-CM | POA: Diagnosis present

## 2022-12-25 DIAGNOSIS — Z170 Estrogen receptor positive status [ER+]: Secondary | ICD-10-CM | POA: Diagnosis present

## 2022-12-25 DIAGNOSIS — K8020 Calculus of gallbladder without cholecystitis without obstruction: Secondary | ICD-10-CM | POA: Diagnosis not present

## 2022-12-25 DIAGNOSIS — R160 Hepatomegaly, not elsewhere classified: Secondary | ICD-10-CM | POA: Diagnosis not present

## 2022-12-25 MED ORDER — GADOBUTROL 1 MMOL/ML IV SOLN
8.0000 mL | Freq: Once | INTRAVENOUS | Status: AC | PRN
  Administered 2022-12-25: 8 mL via INTRAVENOUS

## 2023-01-04 DIAGNOSIS — Z421 Encounter for breast reconstruction following mastectomy: Secondary | ICD-10-CM | POA: Diagnosis not present

## 2023-01-04 DIAGNOSIS — Z91018 Allergy to other foods: Secondary | ICD-10-CM | POA: Diagnosis not present

## 2023-01-04 DIAGNOSIS — Z91040 Latex allergy status: Secondary | ICD-10-CM | POA: Diagnosis not present

## 2023-01-04 DIAGNOSIS — Z9013 Acquired absence of bilateral breasts and nipples: Secondary | ICD-10-CM | POA: Diagnosis not present

## 2023-01-04 DIAGNOSIS — Z888 Allergy status to other drugs, medicaments and biological substances status: Secondary | ICD-10-CM | POA: Diagnosis not present

## 2023-01-04 DIAGNOSIS — N651 Disproportion of reconstructed breast: Secondary | ICD-10-CM | POA: Diagnosis not present

## 2023-01-04 DIAGNOSIS — Z87891 Personal history of nicotine dependence: Secondary | ICD-10-CM | POA: Diagnosis not present

## 2023-01-04 DIAGNOSIS — L905 Scar conditions and fibrosis of skin: Secondary | ICD-10-CM | POA: Diagnosis not present

## 2023-01-06 ENCOUNTER — Ambulatory Visit: Payer: Federal, State, Local not specified - PPO | Admitting: Adult Health

## 2023-01-06 ENCOUNTER — Other Ambulatory Visit: Payer: Federal, State, Local not specified - PPO

## 2023-01-12 DIAGNOSIS — Z09 Encounter for follow-up examination after completed treatment for conditions other than malignant neoplasm: Secondary | ICD-10-CM | POA: Diagnosis not present

## 2023-01-13 ENCOUNTER — Other Ambulatory Visit: Payer: Self-pay

## 2023-01-13 DIAGNOSIS — D500 Iron deficiency anemia secondary to blood loss (chronic): Secondary | ICD-10-CM

## 2023-01-13 DIAGNOSIS — C50412 Malignant neoplasm of upper-outer quadrant of left female breast: Secondary | ICD-10-CM

## 2023-01-14 ENCOUNTER — Inpatient Hospital Stay: Payer: Commercial Managed Care - PPO | Attending: Adult Health | Admitting: Adult Health

## 2023-01-14 ENCOUNTER — Inpatient Hospital Stay: Payer: Commercial Managed Care - PPO

## 2023-01-14 ENCOUNTER — Encounter: Payer: Self-pay | Admitting: Adult Health

## 2023-01-14 VITALS — BP 134/79 | HR 80 | Temp 97.3°F | Resp 16 | Wt 187.6 lb

## 2023-01-14 DIAGNOSIS — Z87891 Personal history of nicotine dependence: Secondary | ICD-10-CM | POA: Diagnosis not present

## 2023-01-14 DIAGNOSIS — N6310 Unspecified lump in the right breast, unspecified quadrant: Secondary | ICD-10-CM | POA: Insufficient documentation

## 2023-01-14 DIAGNOSIS — Z8349 Family history of other endocrine, nutritional and metabolic diseases: Secondary | ICD-10-CM | POA: Diagnosis not present

## 2023-01-14 DIAGNOSIS — Z1721 Progesterone receptor positive status: Secondary | ICD-10-CM | POA: Diagnosis not present

## 2023-01-14 DIAGNOSIS — K760 Fatty (change of) liver, not elsewhere classified: Secondary | ICD-10-CM | POA: Insufficient documentation

## 2023-01-14 DIAGNOSIS — E119 Type 2 diabetes mellitus without complications: Secondary | ICD-10-CM | POA: Diagnosis not present

## 2023-01-14 DIAGNOSIS — Z823 Family history of stroke: Secondary | ICD-10-CM | POA: Diagnosis not present

## 2023-01-14 DIAGNOSIS — Z83438 Family history of other disorder of lipoprotein metabolism and other lipidemia: Secondary | ICD-10-CM | POA: Insufficient documentation

## 2023-01-14 DIAGNOSIS — C50412 Malignant neoplasm of upper-outer quadrant of left female breast: Secondary | ICD-10-CM | POA: Insufficient documentation

## 2023-01-14 DIAGNOSIS — Z841 Family history of disorders of kidney and ureter: Secondary | ICD-10-CM | POA: Insufficient documentation

## 2023-01-14 DIAGNOSIS — R160 Hepatomegaly, not elsewhere classified: Secondary | ICD-10-CM | POA: Insufficient documentation

## 2023-01-14 DIAGNOSIS — Z79811 Long term (current) use of aromatase inhibitors: Secondary | ICD-10-CM | POA: Insufficient documentation

## 2023-01-14 DIAGNOSIS — Z833 Family history of diabetes mellitus: Secondary | ICD-10-CM | POA: Insufficient documentation

## 2023-01-14 DIAGNOSIS — Z8249 Family history of ischemic heart disease and other diseases of the circulatory system: Secondary | ICD-10-CM | POA: Diagnosis not present

## 2023-01-14 DIAGNOSIS — Z87442 Personal history of urinary calculi: Secondary | ICD-10-CM | POA: Insufficient documentation

## 2023-01-14 DIAGNOSIS — Z807 Family history of other malignant neoplasms of lymphoid, hematopoietic and related tissues: Secondary | ICD-10-CM | POA: Insufficient documentation

## 2023-01-14 DIAGNOSIS — Z825 Family history of asthma and other chronic lower respiratory diseases: Secondary | ICD-10-CM | POA: Insufficient documentation

## 2023-01-14 DIAGNOSIS — Z811 Family history of alcohol abuse and dependence: Secondary | ICD-10-CM | POA: Diagnosis not present

## 2023-01-14 DIAGNOSIS — Z170 Estrogen receptor positive status [ER+]: Secondary | ICD-10-CM | POA: Insufficient documentation

## 2023-01-14 DIAGNOSIS — D500 Iron deficiency anemia secondary to blood loss (chronic): Secondary | ICD-10-CM

## 2023-01-14 DIAGNOSIS — Z808 Family history of malignant neoplasm of other organs or systems: Secondary | ICD-10-CM | POA: Insufficient documentation

## 2023-01-14 DIAGNOSIS — Z818 Family history of other mental and behavioral disorders: Secondary | ICD-10-CM | POA: Insufficient documentation

## 2023-01-14 DIAGNOSIS — Z79899 Other long term (current) drug therapy: Secondary | ICD-10-CM | POA: Diagnosis not present

## 2023-01-14 DIAGNOSIS — Z90722 Acquired absence of ovaries, bilateral: Secondary | ICD-10-CM | POA: Diagnosis not present

## 2023-01-14 DIAGNOSIS — K769 Liver disease, unspecified: Secondary | ICD-10-CM

## 2023-01-14 DIAGNOSIS — E669 Obesity, unspecified: Secondary | ICD-10-CM | POA: Insufficient documentation

## 2023-01-14 LAB — CMP (CANCER CENTER ONLY)
ALT: 30 U/L (ref 0–44)
AST: 21 U/L (ref 15–41)
Albumin: 4.1 g/dL (ref 3.5–5.0)
Alkaline Phosphatase: 78 U/L (ref 38–126)
Anion gap: 9 (ref 5–15)
BUN: 12 mg/dL (ref 6–20)
CO2: 27 mmol/L (ref 22–32)
Calcium: 9.7 mg/dL (ref 8.9–10.3)
Chloride: 103 mmol/L (ref 98–111)
Creatinine: 0.67 mg/dL (ref 0.44–1.00)
GFR, Estimated: >60 mL/min (ref >60)
Glucose, Bld: 164 mg/dL — ABNORMAL HIGH (ref 70–99)
Potassium: 4.2 mmol/L (ref 3.5–5.1)
Sodium: 139 mmol/L (ref 135–145)
Total Bilirubin: 0.3 mg/dL (ref <1.2)
Total Protein: 7.3 g/dL (ref 6.5–8.1)

## 2023-01-14 LAB — CBC WITH DIFFERENTIAL (CANCER CENTER ONLY)
Abs Immature Granulocytes: 0.15 10*3/uL — ABNORMAL HIGH (ref 0.00–0.07)
Basophils Absolute: 0.1 10*3/uL (ref 0.0–0.1)
Basophils Relative: 1 %
Eosinophils Absolute: 0.6 10*3/uL — ABNORMAL HIGH (ref 0.0–0.5)
Eosinophils Relative: 7 %
HCT: 39.9 % (ref 36.0–46.0)
Hemoglobin: 12.5 g/dL (ref 12.0–15.0)
Immature Granulocytes: 2 %
Lymphocytes Relative: 26 %
Lymphs Abs: 2.4 10*3/uL (ref 0.7–4.0)
MCH: 25.9 pg — ABNORMAL LOW (ref 26.0–34.0)
MCHC: 31.3 g/dL (ref 30.0–36.0)
MCV: 82.8 fL (ref 80.0–100.0)
Monocytes Absolute: 0.5 10*3/uL (ref 0.1–1.0)
Monocytes Relative: 6 %
Neutro Abs: 5.6 10*3/uL (ref 1.7–7.7)
Neutrophils Relative %: 58 %
Platelet Count: 390 10*3/uL (ref 150–400)
RBC: 4.82 MIL/uL (ref 3.87–5.11)
RDW: 15.8 % — ABNORMAL HIGH (ref 11.5–15.5)
WBC Count: 9.4 10*3/uL (ref 4.0–10.5)
nRBC: 0 % (ref 0.0–0.2)

## 2023-01-14 LAB — IRON AND IRON BINDING CAPACITY (CC-WL,HP ONLY)
Iron: 47 ug/dL (ref 28–170)
Saturation Ratios: 12 % (ref 10.4–31.8)
TIBC: 399 ug/dL (ref 250–450)
UIBC: 352 ug/dL (ref 148–442)

## 2023-01-14 LAB — FERRITIN: Ferritin: 37 ng/mL (ref 11–307)

## 2023-01-14 LAB — VITAMIN B12: Vitamin B-12: 497 pg/mL (ref 180–914)

## 2023-01-14 NOTE — Progress Notes (Signed)
[facility] Cancer Follow up:    Diana Ferretti, DO [address] [place] [address]   DIAGNOSIS:  Cancer Staging  Malignant neoplasm of upper-outer quadrant of left breast in female, estrogen receptor positive (HCC) Staging form: Breast, AJCC 8th Edition - Clinical stage from 01/21/2021: Stage IB (cT1c, cN1, cM0, G2, ER+, PR+, HER2-) - Signed by Malachy Mood, MD on 02/04/2021 Stage prefix: Initial diagnosis Histologic grading system: 3 grade system - Pathologic stage from 04/21/2021: Stage IA (pT1c, pN1(sn), cM0, G2, ER+, PR+, HER2-) - Signed by Ronny Bacon, PA-C on 04/21/2021 Stage prefix: Initial diagnosis Method of lymph node assessment: Sentinel lymph node biopsy Multigene prognostic tests performed: MammaPrint Histologic grading system: 3 grade system   SUMMARY OF ONCOLOGIC HISTORY: Oncology History Overview Note   Cancer Staging  Malignant neoplasm of upper-outer quadrant of left breast in female, estrogen receptor positive (HCC) Staging form: Breast, AJCC 8th Edition - Clinical stage from 01/21/2021: Stage IB (cT1c, cN1, cM0, G2, ER+, PR+, HER2-) - Signed by Malachy Mood, MD on 02/04/2021 - Pathologic stage from 04/21/2021: Stage IA (pT1c, pN1(sn), cM0, G2, ER+, PR+, HER2-) - Signed by Ronny Bacon, PA-C on 04/21/2021     Malignant neoplasm of upper-outer quadrant of left breast in female, estrogen receptor positive (HCC)  01/14/2021 Mammogram   EXAM: DIGITAL DIAGNOSTIC BILATERAL MAMMOGRAM WITH TOMOSYNTHESIS AND CAD; ULTRASOUND LEFT BREAST LIMITED  IMPRESSION: 1. Suspicious left breast mass at the 2:30 position 9 cm from the nipple corresponding with the screening mammographic findings. Recommendation is for ultrasound-guided biopsy. 2. Single, morphologically abnormal left axillary lymph node. Recommendation is for ultrasound-guided biopsy. 3. No persistent, suspicious findings in the right breast.   01/21/2021 Cancer Staging   Staging  form: Breast, AJCC 8th Edition - Clinical stage from 01/21/2021: Stage IB (cT1c, cN1, cM0, G2, ER+, PR+, HER2-) - Signed by Malachy Mood, MD on 02/04/2021 Stage prefix: Initial diagnosis Histologic grading system: 3 grade system   01/21/2021 Pathology Results   Diagnosis 1. Breast, left, needle core biopsy, 2:30 o'clock, ribbon, 9cmfn - INVASIVE DUCTAL CARCINOMA - DUCTAL CARCINOMA IN SITU - SEE COMMENT 2. Lymph node, needle/core biopsy, left axillary node, tribell - METASTATIC CARCINOMA INVOLVING NODAL TISSUE Microscopic Comment 1. Based on the biopsy, the carcinoma appears Nottingham grade 2 of 3 and measures 1.1 cm in greatest linear extent.  1. PROGNOSTIC INDICATORS Results: The tumor cells are EQUIVOCAL for Her2 (2+). Her2 by FISH will be performed and results reported separately. Estrogen Receptor: 100%, POSITIVE, STRONG STAINING INTENSITY Progesterone Receptor: 100%, POSITIVE, STRONG STAINING INTENSITY Proliferation Marker Ki67: 15% 1. FLUORESCENCE IN-SITU HYBRIDIZATION Results: GROUP 5: HER2 **NEGATIVE**  2. PROGNOSTIC INDICATORS Results: The tumor cells are EQUIVOCAL for Her2 (2+). Her2 by FISH will be performed and results reported separately Estrogen Receptor: 100%, POSITIVE, STRONG STAINING INTENSITY Progesterone Receptor: 100%, POSITIVE, STRONG STAINING INTENSITY 2. FLUORESCENCE IN-SITU HYBRIDIZATION Results: GROUP 5: HER2 **NEGATIVE**   01/21/2021 Miscellaneous   Mammaprint: LOW RISK   02/04/2021 Initial Diagnosis   Malignant neoplasm of upper-outer quadrant of left breast in female, estrogen receptor positive (HCC)   02/04/2021 Imaging   EXAM: BILATERAL BREAST MRI WITH AND WITHOUT CONTRAST  IMPRESSION: 1. Known malignancy in the LATERAL portion of the LEFT breast measures 3.0 centimeters on MRI. 2. Single enlarged LEFT axillary lymph node represents the recently biopsied node with metastatic carcinoma. 3. Two additional suspicious masses in the UPPER  central LEFT breast warranting tissue diagnosis. Given the very heterogeneous appearance of the LEFT  breast, with numerous cysts, MR guided core biopsy is indicated for optimal targeting. 4. RIGHT breast is negative.   02/13/2021 Pathology Results   Diagnosis 1. Breast, left, needle core biopsy, upper outer quadrant(anterior lesion) - FOCAL INTRAVASCULAR TUMOR. - DUCTAL PAPILLOMA. - COLUMNAR CELL CHANGE WITH CALCIFICATIONS. - PSEUDOANGIOMATOUS STROMAL HYPERPLASIA (PASH). - SEE MICROSCOPIC DESCRIPTION. 2. Breast, left, needle core biopsy, upper outer quadrant(posterior lesion) - INVASIVE AND IN SITU DUCTAL CARCINOMA, GRADE 2. - SEE MICROSCOPIC DESCRIPTION.   02/17/2021 Genetic Testing   Negative hereditary cancer genetic testing: no pathogenic variants detected in [facility] CancerNext-Expanded +RNAinsight Panel.  The report date is 02/17/2021.    The CancerNext-Expanded gene panel offered by [facility] and includes sequencing, rearrangement, and RNA analysis for the following 77 genes: AIP, ALK, APC, ATM, AXIN2, BAP1, BARD1, BLM, BMPR1A, BRCA1, BRCA2, BRIP1, CDC73, CDH1, CDK4, CDKN1B, CDKN2A, CHEK2, CTNNA1, DICER1, FANCC, FH, FLCN, GALNT12, KIF1B, LZTR1, MAX, MEN1, MET, MLH1, MSH2, MSH3, MSH6, MUTYH, NBN, NF1, NF2, NTHL1, PALB2, PHOX2B, PMS2, POT1, PRKAR1A, PTCH1, PTEN, RAD51C, RAD51D, RB1, RECQL, RET, SDHA, SDHAF2, SDHB, SDHC, SDHD, SMAD4, SMARCA4, SMARCB1, SMARCE1, STK11, SUFU, TMEM127, TP53, TSC1, TSC2, VHL and XRCC2 (sequencing and deletion/duplication); EGFR, EGLN1, HOXB13, KIT, MITF, PDGFRA, POLD1, and POLE (sequencing only); EPCAM and GREM1 (deletion/duplication only).    04/21/2021 Cancer Staging   Staging form: Breast, AJCC 8th Edition - Pathologic stage from 04/21/2021: Stage IA (pT1c, pN1(sn), cM0, G2, ER+, PR+, HER2-) - Signed by Ronny Bacon, PA-C on 04/21/2021 Stage prefix: Initial diagnosis Method of lymph node assessment: Sentinel lymph node biopsy Multigene prognostic  tests performed: MammaPrint Histologic grading system: 3 grade system    Definitive Surgery   FINAL MICROSCOPIC DIAGNOSIS:   A. OVARY AND FALLOPIAN TUBE, RIGHT:  -  Benign ovary  -  Benign fallopian tube with paratubal cyst(s)  -  No malignancy identified   B. OVARY AND FALLOPIAN TUBE, LEFT:  -  Endometriosis involving fallopian tube  -  Benign ovary with a simple serous cyst  -  No malignancy identified   C. LYMPH NODE, LEFT AXILLARY #2, SENTINEL, EXCISION:  -  Metastatic carcinoma involving one lymph node (1/1)  -  No extracapsular extension identified   D. BREAST, RIGHT, MASTECTOMY:  -  Fibroadenomatoid nodule  -  Usual ductal hyperplasia, columnar cell and fibrocystic changes with calcifications  -  No malignancy identified   E. BREAST, LEFT, MASTECTOMY:  -  Invasive ductal carcinoma, Nottingham grade 2 of 3, 1.8 cm  -  Ductal carcinoma in-situ, intermediate grade  -  Calcifications associated with carcinoma  -  Margins uninvolved by carcinoma  -  Previous biopsy site changes present (x3)  -  See oncology table and comment below   F. LYMPH NODE, LEFT AXILLARY #1, SENTINEL, EXCISION:  -  No carcinoma identified in one lymph node (0/1)   G. LYMPH NODE, LEFT AXILLARY #3, SENTINEL, EXCISION:  -  No carcinoma identified in one lymph node (0/1)   H. LYMPH NODE, LEFT AXILLARY #4, SENTINEL, EXCISION:  -  No carcinoma identified in one lymph node (0/1)   I. LYMPH NODE, LEFT AXILLARY #5, SENTINEL, EXCISION:  -  No carcinoma identified in one lymph node (0/1)    06/17/2021 - 07/31/2021 Radiation Therapy   Site Technique Total Dose (Gy) Dose per Fx (Gy) Completed Fx Beam Energies  Chest Wall, Left: CW_L 3D 50.4/50.4 1.8 28/28 10X  Chest Wall, Left: CW_L_SCLV 3D 50.4/50.4 1.8 28/28 6X, 10X  Chest Wall, Left: CW_L_Bst Electron 10/10  2 5/5 9E     08/2021 -  Anti-estrogen oral therapy   Letrozole     CURRENT THERAPY: Letrozole  INTERVAL HISTORY:  Discussed the use of AI  scribe software for clinical note transcription with the patient, who gave verbal consent to proceed.  Diana Weaver 47 y.o. female with a history of breast cancer, is currently on daily letrozole treatment. She reports no issues with the medication. Her exercise routine has been temporarily halted due to a recent fifth reconstructive surgery. Prior to the surgery, she was engaging in daily walks of about two miles with her spouse.  Her diet is rich in fruits and vegetables, meeting the recommended five servings a day. She has been regularly seeing a primary care provider and reports no new pain apart from the surgical sites.  In the past, she experienced a significant drop in blood counts following a complicated reconstructive surgery that resulted in substantial blood loss. She has since recovered from this incident.  Alysia recently underwent MRI of the liver with and without contrast on December 25, 2022.  It demonstrated unchanged heterogeneously hyperenhancing subscapular lesion of the posterior right lobe of the liver measuring 1.6 x 1.5 cm.  This remained indeterminate but suggestive of poorly marginated focal nodular hyperplasia or small hepatic adenoma.  Repeat MRI was recommended to occur in 1 years time.  She was also noted to have hepatomegaly and hepatic steatosis along with cholelithiasis.   She has been seeing Doctor Mosetta Putt and myself for follow-ups every six months. She reports no new concerns or questions about her health status or treatment plan.   Patient Active Problem List   Diagnosis Date Noted   Diabetes mellitus type 2, uncomplicated (HCC) 07/06/2022   Iron deficiency anemia 07/06/2022   Genetic testing 02/18/2021   Family history of melanoma 02/05/2021   Malignant neoplasm of upper-outer quadrant of left breast in female, estrogen receptor positive (HCC) 02/04/2021   Obesity (BMI 30-39.9) 05/10/2018   Premature contraction 05/10/2018    is allergic to betadine  [povidone-iodine], latex, and pistachio nut (diagnostic).  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     LAPAROSCOPY N/A 03/27/2021   Procedure: DIAGNOSTIC LAPAROSCOPY ;  Surgeon: Harold Hedge, MD;  Location: [facility] OR;  Service: Gynecology;  Laterality: N/A;   LAPAROTOMY N/A 03/27/2021   Procedure: MINI LAPAROTOMY;  Surgeon: Harold Hedge, MD;  Location: [facility] OR;  Service: Gynecology;  Laterality: N/A;   MASTECTOMY W/ SENTINEL NODE BIOPSY Left 03/27/2021   Procedure: LEFT MASTECTOMY WITH SENTINEL LYMPH NODE BIOPSY;  Surgeon: Almond Lint, MD;  Location: [facility] OR;  Service: General;  Laterality: Left;   RADIOACTIVE SEED GUIDED AXILLARY SENTINEL LYMPH NODE Left 03/27/2021   Procedure: RADIOACTIVE SEED GUIDED LEFT AXILLARY SENTINEL LYMPH NODE BIOPSY;  Surgeon: Almond Lint, MD;  Location: [facility] OR;  Service: General;  Laterality: Left;   SALPINGOOPHORECTOMY Bilateral 03/27/2021   Procedure: OPEN BILATERAL SALPINGO OOPHORECTOMY;  Surgeon: Harold Hedge, MD;  Location: [facility] OR;  Service: Gynecology;  Laterality: Bilateral;   TOTAL MASTECTOMY Right 03/27/2021   Procedure: RIGHT TOTAL MASTECTOMY;  Surgeon: Almond Lint, MD;  Location: [facility] OR;  Service: General;  Laterality: Right;    SOCIAL HISTORY: Social History   Socioeconomic History   Marital status: Married    Spouse name: Not on file   Number of children: 1   Years of education: Not on file   Highest education level: Not on file  Occupational History   Occupation: Building services engineer  Tobacco Use   Smoking status: Former    Current packs/day: 0.00    Average packs/day: 0.3 packs/day for 10.0 years (2.5 ttl pk-yrs)    Types:  Cigarettes    Start date: 03/10/1999    Quit date: 03/09/2009    Years since quitting: 13.8   Smokeless tobacco: Never  Vaping Use   Vaping status: Never Used  Substance and Sexual Activity   Alcohol use: Yes    Comment: 2-4 drinks per year   Drug use: Never   Sexual activity: Yes    Birth control/protection: Condom  Other Topics Concern   Not on file  Social History Narrative   Not on file   Social Determinants of Health   Financial Resource Strain: Not on file  Food Insecurity: Low Risk  (03/02/2022)   [facility]   Hunger Vital Sign    Worried About Running Out of Food in the Last Year: Never true    Within the past 12 months, the food you bought just didn't last and you didn't have money to get more: Not on file  Transportation Needs: No Transportation Needs (03/02/2022)   [facility]   Transportation    In the past 12 months, has lack of reliable transportation kept you from medical appointments, meetings, work or from getting things needed for daily living? : No  Physical Activity: Not on file  Stress: Not on file  Social Connections: Not on file  Intimate Partner Violence: Low Risk  (03/02/2022)   [facility] visits prior to 05/09/2022., [facility] visits prior to 05/09/2022.   Safety    How often does anyone, including family and friends, physically hurt you?: Never    How often does anyone, including family and friends, insult or talk down to you?: Never    How often does anyone, including family and friends, threaten you with harm?: Never    How often does anyone, including family and friends, scream or curse at you?: Never    FAMILY HISTORY: Family History  Problem Relation Age of Onset   Hypertension Mother    Hyperlipidemia Mother    Melanoma Mother 75       forehead; surgery only   Depression Father    Hyperlipidemia Father    Hypertension  Father    Alcohol abuse Father    Early death Father    Asthma Sister    Depression Sister    Diabetes Sister    Kidney disease Sister    Obesity Sister    Alcohol abuse Maternal Grandfather    Stroke Maternal Grandfather    Cancer Maternal Grandfather        oral cancer; d. early 32s   Hodgkin's lymphoma Paternal Grandfather        d. <60?   Throat cancer Cousin        maternal female cousin; dx 64s    Review of Systems  Constitutional:  Negative for appetite change, chills, fatigue, fever and unexpected weight change.  HENT:   Negative for hearing loss, lump/mass and trouble swallowing.  Eyes:  Negative for eye problems and icterus.  Respiratory:  Negative for chest tightness, cough and shortness of breath.   Cardiovascular:  Negative for chest pain, leg swelling and palpitations.  Gastrointestinal:  Negative for abdominal distention, abdominal pain, constipation, diarrhea, nausea and vomiting.  Endocrine: Negative for hot flashes.  Genitourinary:  Negative for difficulty urinating.   Musculoskeletal:  Negative for arthralgias.  Skin:  Negative for itching and rash.  Neurological:  Negative for dizziness, extremity weakness, headaches and numbness.  Hematological:  Negative for adenopathy. Does not bruise/bleed easily.  Psychiatric/Behavioral:  Negative for depression. The patient is not nervous/anxious.       PHYSICAL EXAMINATION    Vitals:   01/14/23 0936  BP: 134/79  Pulse: 80  Resp: 16  Temp: (!) 97.3 F (36.3 C)  SpO2: 99%    Physical Exam Constitutional:      General: She is not in acute distress.    Appearance: Normal appearance. She is not toxic-appearing.  HENT:     Head: Normocephalic and atraumatic.     Mouth/Throat:     Mouth: Mucous membranes are moist.     Pharynx: Oropharynx is clear. No oropharyngeal exudate or posterior oropharyngeal erythema.  Eyes:     General: No scleral icterus. Cardiovascular:     Rate and Rhythm: Normal rate and  regular rhythm.     Pulses: Normal pulses.     Heart sounds: Normal heart sounds.  Pulmonary:     Effort: Pulmonary effort is normal.     Breath sounds: Normal breath sounds.  Chest:     Comments: Visually inspected only due to recent reconstruction, she has Steri-Strips in place bilateral breasts are healing well, her abdominal incision is healing well to there are no signs of infection. Abdominal:     General: Abdomen is flat. Bowel sounds are normal. There is no distension.     Palpations: Abdomen is soft.     Tenderness: There is no abdominal tenderness.  Musculoskeletal:        General: No swelling.     Cervical back: Neck supple.  Lymphadenopathy:     Cervical: No cervical adenopathy.  Skin:    General: Skin is warm and dry.     Findings: No rash.  Neurological:     General: No focal deficit present.     Mental Status: She is alert.  Psychiatric:        Mood and Affect: Mood normal.        Behavior: Behavior normal.     LABORATORY DATA:  CBC    Component Value Date/Time   WBC 9.4 01/14/2023 0858   WBC 7.1 01/01/2022 0922   RBC 4.82 01/14/2023 0858   HGB 12.5 01/14/2023 0858   HCT 39.9 01/14/2023 0858   PLT 390 01/14/2023 0858   MCV 82.8 01/14/2023 0858   MCH 25.9 (L) 01/14/2023 0858   MCHC 31.3 01/14/2023 0858   RDW 15.8 (H) 01/14/2023 0858   LYMPHSABS 2.4 01/14/2023 0858   MONOABS 0.5 01/14/2023 0858   EOSABS 0.6 (H) 01/14/2023 0858   BASOSABS 0.1 01/14/2023 0858    CMP     Component Value Date/Time   NA 139 01/14/2023 0858   K 4.2 01/14/2023 0858   CL 103 01/14/2023 0858   CO2 27 01/14/2023 0858   GLUCOSE 164 (H) 01/14/2023 0858   BUN 12 01/14/2023 0858   CREATININE 0.67 01/14/2023 0858   CALCIUM 9.7 01/14/2023 0858   PROT 7.3 01/14/2023 0858  ALBUMIN 4.1 01/14/2023 0858   AST 21 01/14/2023 0858   ALT 30 01/14/2023 0858   ALKPHOS 78 01/14/2023 0858   BILITOT 0.3 01/14/2023 0858   GFRNONAA >60 01/14/2023 0858      ASSESSMENT and THERAPY  PLAN:   Malignant neoplasm of upper-outer quadrant of left breast in female, estrogen receptor positive (HCC) Ashleigh is a 47 year old woman with history of 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  Breast Cancer Stable on Letrozole, no reported issues. Recent reconstructive surgery with good recovery. -Continue Letrozole as prescribed. -Follow up with Dr. Mosetta Putt in 6 months.  Post-Surgical Recovery Recent reconstructive surgery, no reported complications. Healing well with expected swelling. -Continue current post-operative care.  Liver lesion on MRI Continued stability on liver MRI completed 12/25/2022 -Repeat liver MRI in 1 years time  General Health Maintenance Regular exercise and good diet reported prior to surgery. Currently on hold due to recent surgery. -Resume regular exercise and maintain good diet once fully recovered from surgery.   All questions were answered. The patient knows to call the clinic with any problems, questions or concerns. We can certainly see the patient much sooner if necessary.  Total encounter time:20 minutes*in face-to-face visit time, chart review, lab review, care coordination, order entry, and documentation of the encounter time.    Lillard Anes, NP 01/14/23 1:01 PM Medical Oncology and Hematology [facility] [address] [place], [place] [address] Tel. [phone]    Fax. [phone]  *Total Encounter Time as defined by the Centers for Medicare and Medicaid Services includes, in addition to the face-to-face time of a patient visit (documented in the note above) non-face-to-face time: obtaining and reviewing outside history, ordering and reviewing medications, tests or procedures, care coordination (communications with other health care professionals or caregivers) and  documentation in the medical record.

## 2023-01-14 NOTE — Assessment & Plan Note (Signed)
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  Breast Cancer Stable on Letrozole, no reported issues. Recent reconstructive surgery with good recovery. -Continue Letrozole as prescribed. -Follow up with Dr. Mosetta Putt in 6 months.  Post-Surgical Recovery Recent reconstructive surgery, no reported complications. Healing well with expected swelling. -Continue current post-operative care.  Liver lesion on MRI Continued stability on liver MRI completed 12/25/2022 -Repeat liver MRI in 1 years time  General Health Maintenance Regular exercise and good diet reported prior to surgery. Currently on hold due to recent surgery. -Resume regular exercise and maintain good diet once fully recovered from surgery.

## 2023-01-20 ENCOUNTER — Encounter: Payer: Self-pay | Admitting: Adult Health

## 2023-02-10 DIAGNOSIS — C50412 Malignant neoplasm of upper-outer quadrant of left female breast: Secondary | ICD-10-CM | POA: Diagnosis not present

## 2023-03-26 DIAGNOSIS — D509 Iron deficiency anemia, unspecified: Secondary | ICD-10-CM | POA: Diagnosis not present

## 2023-05-24 ENCOUNTER — Other Ambulatory Visit: Payer: Self-pay | Admitting: Hematology

## 2023-05-25 DIAGNOSIS — Z01818 Encounter for other preprocedural examination: Secondary | ICD-10-CM | POA: Diagnosis not present

## 2023-06-17 DIAGNOSIS — Z9889 Other specified postprocedural states: Secondary | ICD-10-CM | POA: Diagnosis not present

## 2023-06-17 DIAGNOSIS — C50412 Malignant neoplasm of upper-outer quadrant of left female breast: Secondary | ICD-10-CM | POA: Diagnosis not present

## 2023-06-17 DIAGNOSIS — C50419 Malignant neoplasm of upper-outer quadrant of unspecified female breast: Secondary | ICD-10-CM | POA: Diagnosis not present

## 2023-06-17 DIAGNOSIS — Z170 Estrogen receptor positive status [ER+]: Secondary | ICD-10-CM | POA: Diagnosis not present

## 2023-06-17 DIAGNOSIS — L905 Scar conditions and fibrosis of skin: Secondary | ICD-10-CM | POA: Diagnosis not present

## 2023-06-17 DIAGNOSIS — Z421 Encounter for breast reconstruction following mastectomy: Secondary | ICD-10-CM | POA: Diagnosis not present

## 2023-06-17 DIAGNOSIS — E119 Type 2 diabetes mellitus without complications: Secondary | ICD-10-CM | POA: Diagnosis not present

## 2023-06-17 DIAGNOSIS — Z7984 Long term (current) use of oral hypoglycemic drugs: Secondary | ICD-10-CM | POA: Diagnosis not present

## 2023-06-29 DIAGNOSIS — Z09 Encounter for follow-up examination after completed treatment for conditions other than malignant neoplasm: Secondary | ICD-10-CM | POA: Diagnosis not present

## 2023-07-13 NOTE — Progress Notes (Unsigned)
 [facility]     [phone] [phone]    Patient Care Team: Windell Hasty, DO as PCP - General (Internal Medicine) Lockie Rima, MD as Consulting Physician (General Surgery) Sonja Cooke, MD as Consulting Physician (Hematology) Johna Myers, MD as Consulting Physician (Radiation Oncology) Santos Cullens, MD as Referring Physician (Plastic Surgery)   CHIEF COMPLAINT: Follow up left breast cancer   Oncology History Overview Note   Cancer Staging  Malignant neoplasm of upper-outer quadrant of left breast in female, estrogen receptor positive (HCC) Staging form: Breast, AJCC 8th Edition - Clinical stage from 01/21/2021: Stage IB (cT1c, cN1, cM0, G2, ER+, PR+, HER2-) - Signed by Sonja Gaylord, MD on 02/04/2021 - Pathologic stage from 04/21/2021: Stage IA (pT1c, pN1(sn), cM0, G2, ER+, PR+, HER2-) - Signed by Bettejane Brownie, PA-C on 04/21/2021     Malignant neoplasm of upper-outer quadrant of left breast in female, estrogen receptor positive (HCC)  01/14/2021 Mammogram   EXAM: DIGITAL DIAGNOSTIC BILATERAL MAMMOGRAM WITH TOMOSYNTHESIS AND CAD; ULTRASOUND LEFT BREAST LIMITED  IMPRESSION: 1. Suspicious left breast mass at the 2:30 position 9 cm from the nipple corresponding with the screening mammographic findings. Recommendation is for ultrasound-guided biopsy. 2. Single, morphologically abnormal left axillary lymph node. Recommendation is for ultrasound-guided biopsy. 3. No persistent, suspicious findings in the right breast.   01/21/2021 Cancer Staging   Staging form: Breast, AJCC 8th Edition - Clinical stage from 01/21/2021: Stage IB (cT1c, cN1, cM0, G2, ER+, PR+, HER2-) - Signed by Sonja Kent, MD on 02/04/2021 Stage prefix: Initial diagnosis Histologic grading system: 3 grade system   01/21/2021 Pathology Results   Diagnosis 1. Breast, left, needle core biopsy, 2:30 o'clock, ribbon, 9cmfn - INVASIVE DUCTAL CARCINOMA - DUCTAL  CARCINOMA IN SITU - SEE COMMENT 2. Lymph node, needle/core biopsy, left axillary node, tribell - METASTATIC CARCINOMA INVOLVING NODAL TISSUE Microscopic Comment 1. Based on the biopsy, the carcinoma appears Nottingham grade 2 of 3 and measures 1.1 cm in greatest linear extent.  1. PROGNOSTIC INDICATORS Results: The tumor cells are EQUIVOCAL for Her2 (2+). Her2 by FISH will be performed and results reported separately. Estrogen Receptor: 100%, POSITIVE, STRONG STAINING INTENSITY Progesterone Receptor: 100%, POSITIVE, STRONG STAINING INTENSITY Proliferation Marker Ki67: 15% 1. FLUORESCENCE IN-SITU HYBRIDIZATION Results: GROUP 5: HER2 **NEGATIVE**  2. PROGNOSTIC INDICATORS Results: The tumor cells are EQUIVOCAL for Her2 (2+). Her2 by FISH will be performed and results reported separately Estrogen Receptor: 100%, POSITIVE, STRONG STAINING INTENSITY Progesterone Receptor: 100%, POSITIVE, STRONG STAINING INTENSITY 2. FLUORESCENCE IN-SITU HYBRIDIZATION Results: GROUP 5: HER2 **NEGATIVE**   01/21/2021 Miscellaneous   Mammaprint: LOW RISK   02/04/2021 Initial Diagnosis   Malignant neoplasm of upper-outer quadrant of left breast in female, estrogen receptor positive (HCC)   02/04/2021 Imaging   EXAM: BILATERAL BREAST MRI WITH AND WITHOUT CONTRAST  IMPRESSION: 1. Known malignancy in the LATERAL portion of the LEFT breast measures 3.0 centimeters on MRI. 2. Single enlarged LEFT axillary lymph node represents the recently biopsied node with metastatic carcinoma. 3. Two additional suspicious masses in the UPPER central LEFT breast warranting tissue diagnosis. Given the very heterogeneous appearance of the LEFT breast, with numerous cysts, MR guided core biopsy is indicated for optimal targeting. 4. RIGHT breast is negative.   02/13/2021 Pathology Results   Diagnosis 1. Breast, left, needle core biopsy, upper outer quadrant(anterior lesion) - FOCAL INTRAVASCULAR TUMOR. - DUCTAL  PAPILLOMA. - COLUMNAR CELL CHANGE WITH CALCIFICATIONS. - PSEUDOANGIOMATOUS STROMAL HYPERPLASIA (PASH). -  SEE MICROSCOPIC DESCRIPTION. 2. Breast, left, needle core biopsy, upper outer quadrant(posterior lesion) - INVASIVE AND IN SITU DUCTAL CARCINOMA, GRADE 2. - SEE MICROSCOPIC DESCRIPTION.   02/17/2021 Genetic Testing   Negative hereditary cancer genetic testing: no pathogenic variants detected in [facility] CancerNext-Expanded +RNAinsight Panel.  The report date is 02/17/2021.    The CancerNext-Expanded gene panel offered by [facility] and includes sequencing, rearrangement, and RNA analysis for the following 77 genes: AIP, ALK, APC, ATM, AXIN2, BAP1, BARD1, BLM, BMPR1A, BRCA1, BRCA2, BRIP1, CDC73, CDH1, CDK4, CDKN1B, CDKN2A, CHEK2, CTNNA1, DICER1, FANCC, FH, FLCN, GALNT12, KIF1B, LZTR1, MAX, MEN1, MET, MLH1, MSH2, MSH3, MSH6, MUTYH, NBN, NF1, NF2, NTHL1, PALB2, PHOX2B, PMS2, POT1, PRKAR1A, PTCH1, PTEN, RAD51C, RAD51D, RB1, RECQL, RET, SDHA, SDHAF2, SDHB, SDHC, SDHD, SMAD4, SMARCA4, SMARCB1, SMARCE1, STK11, SUFU, TMEM127, TP53, TSC1, TSC2, VHL and XRCC2 (sequencing and deletion/duplication); EGFR, EGLN1, HOXB13, KIT, MITF, PDGFRA, POLD1, and POLE (sequencing only); EPCAM and GREM1 (deletion/duplication only).    04/21/2021 Cancer Staging   Staging form: Breast, AJCC 8th Edition - Pathologic stage from 04/21/2021: Stage IA (pT1c, pN1(sn), cM0, G2, ER+, PR+, HER2-) - Signed by Bettejane Brownie, PA-C on 04/21/2021 Stage prefix: Initial diagnosis Method of lymph node assessment: Sentinel lymph node biopsy Multigene prognostic tests performed: MammaPrint Histologic grading system: 3 grade system    Definitive Surgery   FINAL MICROSCOPIC DIAGNOSIS:   A. OVARY AND FALLOPIAN TUBE, RIGHT:  -  Benign ovary  -  Benign fallopian tube with paratubal cyst(s)  -  No malignancy identified   B. OVARY AND FALLOPIAN TUBE, LEFT:  -  Endometriosis involving fallopian tube  -  Benign ovary with a simple  serous cyst  -  No malignancy identified   C. LYMPH NODE, LEFT AXILLARY #2, SENTINEL, EXCISION:  -  Metastatic carcinoma involving one lymph node (1/1)  -  No extracapsular extension identified   D. BREAST, RIGHT, MASTECTOMY:  -  Fibroadenomatoid nodule  -  Usual ductal hyperplasia, columnar cell and fibrocystic changes with calcifications  -  No malignancy identified   E. BREAST, LEFT, MASTECTOMY:  -  Invasive ductal carcinoma, Nottingham grade 2 of 3, 1.8 cm  -  Ductal carcinoma in-situ, intermediate grade  -  Calcifications associated with carcinoma  -  Margins uninvolved by carcinoma  -  Previous biopsy site changes present (x3)  -  See oncology table and comment below   F. LYMPH NODE, LEFT AXILLARY #1, SENTINEL, EXCISION:  -  No carcinoma identified in one lymph node (0/1)   G. LYMPH NODE, LEFT AXILLARY #3, SENTINEL, EXCISION:  -  No carcinoma identified in one lymph node (0/1)   H. LYMPH NODE, LEFT AXILLARY #4, SENTINEL, EXCISION:  -  No carcinoma identified in one lymph node (0/1)   I. LYMPH NODE, LEFT AXILLARY #5, SENTINEL, EXCISION:  -  No carcinoma identified in one lymph node (0/1)    06/17/2021 - 07/31/2021 Radiation Therapy   Site Technique Total Dose (Gy) Dose per Fx (Gy) Completed Fx Beam Energies  Chest Wall, Left: CW_L 3D 50.4/50.4 1.8 28/28 10X  Chest Wall, Left: CW_L_SCLV 3D 50.4/50.4 1.8 28/28 6X, 10X  Chest Wall, Left: CW_L_Bst Electron 10/10 2 5/5 9E     08/2021 -  Anti-estrogen oral therapy   Letrozole       CURRENT THERAPY: Letrozole   INTERVAL HISTORY Diana Weaver returns for follow up as scheduled. Last seen by my colleague Alwin Baars, NP 01/14/23. Underwent recent reconstruction and is packing an open incision. Pain is  controlled. Recovering well. Will see plastics next week. She continues letrozole , tolerating well. Hot flashes/night sweats are mild and tolerable, medication did not help so she manages this naturally.  Denies joint pain or other  new pain, recent fever, chills, or any other new or specific complaints.  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     LAPAROSCOPY N/A 03/27/2021   Procedure: DIAGNOSTIC LAPAROSCOPY ;  Surgeon: Thora Flint, MD;  Location: [facility] OR;  Service: Gynecology;  Laterality: N/A;   LAPAROTOMY N/A 03/27/2021   Procedure: MINI LAPAROTOMY;  Surgeon: Thora Flint, MD;  Location: [facility] OR;  Service: Gynecology;  Laterality: N/A;   MASTECTOMY W/ SENTINEL NODE BIOPSY Left 03/27/2021   Procedure: LEFT MASTECTOMY WITH SENTINEL LYMPH NODE BIOPSY;  Surgeon: Lockie Rima, MD;  Location: [facility] OR;  Service: General;  Laterality: Left;   RADIOACTIVE SEED GUIDED AXILLARY SENTINEL LYMPH NODE Left 03/27/2021   Procedure: RADIOACTIVE SEED GUIDED LEFT AXILLARY SENTINEL LYMPH NODE BIOPSY;  Surgeon: Lockie Rima, MD;  Location: [facility] OR;  Service: General;  Laterality: Left;   SALPINGOOPHORECTOMY Bilateral 03/27/2021   Procedure: OPEN BILATERAL SALPINGO OOPHORECTOMY;  Surgeon: Thora Flint, MD;  Location: [facility] OR;  Service: Gynecology;  Laterality: Bilateral;   TOTAL MASTECTOMY Right 03/27/2021   Procedure: RIGHT TOTAL MASTECTOMY;  Surgeon: Lockie Rima, MD;  Location: [facility] OR;  Service: General;  Laterality: Right;     Outpatient Encounter Medications as of 07/14/2023  Medication Sig   CALCIUM-VITAMIN D PO Take by mouth daily.  Pt takes once in the evening   letrozole  (FEMARA ) 2.5 MG tablet Take 1 tablet (2.5 mg total) by mouth daily.   metFORMIN  (GLUCOPHAGE ) 1000 MG tablet Take 1,000 mg by mouth 2 (two) times daily with a meal.   [DISCONTINUED] letrozole  (FEMARA ) 2.5 MG tablet TAKE 1 TABLET BY MOUTH  DAILY   [DISCONTINUED] Turmeric 400 MG CAPS Take 1 capsule by mouth daily.   No facility-administered encounter medications on file as of 07/14/2023.     Today's Vitals   07/14/23 0835  BP: (!) 129/90  Pulse: 80  Resp: 18  Temp: 97.7 F (36.5 C)  TempSrc: Tympanic  SpO2: 100%  Weight: 185 lb 4.8 oz (84.1 kg)  Height: 5\' 4"  (1.626 m)   Body mass index is 31.81 kg/m.   ECOG PERFORMANCE STATUS: 0 - Asymptomatic  PHYSICAL EXAM GENERAL:alert, no distress and comfortable SKIN: no rash  EYES: sclera clear NECK: without mass LYMPH:  no palpable cervical or supraclavicular lymphadenopathy  LUNGS:  normal breathing effort HEART: no lower extremity edema ABDOMEN: abdomen soft, non-tender and normal bowel sounds NEURO: alert & oriented x 3 with fluent speech, no focal motor/sensory deficits Breast exam: S/p bilateral mastectomy and multiple reconstruction surgeries, incisions healed with moderate scar tissue.  Open wound at the right low axilla/lateral chest wall, no drainage or surrounding erythema.  No palpable mass in either reconstructed breast/chest wall or axilla that I could appreciate   CBC    Latest Ref Rng & Units 07/14/2023    8:10 AM 01/14/2023    8:58 AM 07/06/2022    8:47 AM  CBC  WBC  4.0 - 10.5 K/uL 8.5  9.4  7.5   Hemoglobin 12.0 - 15.0 g/dL 16.1  09.6  04.5   Hematocrit 36.0 - 46.0 % 42.9  39.9  38.7   Platelets 150 - 400 K/uL 344  390  356       CMP     Latest Ref Rng & Units 07/14/2023    8:10 AM 01/14/2023    8:58 AM 07/06/2022    8:47 AM  CMP  Glucose 70 - 99 mg/dL 409  811  914   BUN 6 - 20 mg/dL 12  12  13    Creatinine 0.44 - 1.00 mg/dL 7.82  9.56  2.13   Sodium 135 - 145 mmol/L 138  139  141   Potassium 3.5 - 5.1 mmol/L 4.6  4.2  4.3   Chloride 98 - 111 mmol/L 103  103  104   CO2 22 - 32 mmol/L 27  27  28    Calcium 8.9 - 10.3 mg/dL 9.6  9.7  08.6   Total Protein 6.5 - 8.1 g/dL 7.6  7.3  7.5   Total Bilirubin 0.0 - 1.2 mg/dL 0.3  0.3  0.3   Alkaline  Phos 38 - 126 U/L 86  78  102   AST 15 - 41 U/L 32  21  25   ALT 0 - 44 U/L 46  30  36       ASSESSMENT & PLAN: 48 yo female  Malignant neoplasm of upper-outer quadrant of left breast, invasive ductal carcinoma, Stage IB, mpT1c, N1a, cM0, ER+/PR+/HER2-, Grade 2, MammaPrint low risk -diagnosed 01/21/21 by screening mammogram. S/p bilateral mastectomies and BSO on 03/27/21 and radiation under Dr. Jeryl Moris 06/17/21 - 07/31/21 -on letrozole  since 08/2021, tolerating well, goal 10 years  -S/p multiple reconstruction surgeries by Dr. Javier Meter, recovering well from most recent -Ms. Reyman is clinically doing well, exam is benign, labs are remarkable for elevated nonfasting BG, she is on metformin .  No clinical concern for recurrence - Continue breast cancer surveillance and letrozole , refilled -Follow-up in 6 months, or sooner if needed   [facility] -DEXA on 10/30/21 was normal, with lowest T-score -0.8 at [facility] - Repeat next year  Benign liver lesion - Imaging shows hepatomegaly, hepatic steatosis, and likely benign lesion in the posterior right lobe - Annual MRI planned for later this year previously ordered    PLAN: -Labs reviewed -Continue breast cancer surveillance and letrozole , refilled -Liver MRI ~12/2023 to monitor likely benign lesion -F/up in 6 months, or sooner if needed    All questions were answered. The patient knows to call the clinic with any problems, questions or concerns. No barriers to learning were detected.   Diana Mancebo K Shanetta Nicolls, NP 07/14/2023

## 2023-07-14 ENCOUNTER — Inpatient Hospital Stay

## 2023-07-14 ENCOUNTER — Inpatient Hospital Stay: Attending: Nurse Practitioner | Admitting: Nurse Practitioner

## 2023-07-14 ENCOUNTER — Other Ambulatory Visit: Payer: Self-pay

## 2023-07-14 ENCOUNTER — Encounter: Payer: Self-pay | Admitting: Nurse Practitioner

## 2023-07-14 VITALS — BP 129/90 | HR 80 | Temp 97.7°F | Resp 18 | Ht 64.0 in | Wt 185.3 lb

## 2023-07-14 DIAGNOSIS — E119 Type 2 diabetes mellitus without complications: Secondary | ICD-10-CM | POA: Diagnosis not present

## 2023-07-14 DIAGNOSIS — C50412 Malignant neoplasm of upper-outer quadrant of left female breast: Secondary | ICD-10-CM | POA: Diagnosis not present

## 2023-07-14 DIAGNOSIS — R232 Flushing: Secondary | ICD-10-CM | POA: Insufficient documentation

## 2023-07-14 DIAGNOSIS — Z90722 Acquired absence of ovaries, bilateral: Secondary | ICD-10-CM | POA: Diagnosis not present

## 2023-07-14 DIAGNOSIS — Z79811 Long term (current) use of aromatase inhibitors: Secondary | ICD-10-CM | POA: Diagnosis not present

## 2023-07-14 DIAGNOSIS — Z170 Estrogen receptor positive status [ER+]: Secondary | ICD-10-CM | POA: Insufficient documentation

## 2023-07-14 DIAGNOSIS — Z79899 Other long term (current) drug therapy: Secondary | ICD-10-CM | POA: Diagnosis not present

## 2023-07-14 DIAGNOSIS — K760 Fatty (change of) liver, not elsewhere classified: Secondary | ICD-10-CM | POA: Diagnosis not present

## 2023-07-14 DIAGNOSIS — R61 Generalized hyperhidrosis: Secondary | ICD-10-CM | POA: Insufficient documentation

## 2023-07-14 DIAGNOSIS — N6310 Unspecified lump in the right breast, unspecified quadrant: Secondary | ICD-10-CM | POA: Diagnosis not present

## 2023-07-14 DIAGNOSIS — Z87442 Personal history of urinary calculi: Secondary | ICD-10-CM | POA: Diagnosis not present

## 2023-07-14 DIAGNOSIS — Z808 Family history of malignant neoplasm of other organs or systems: Secondary | ICD-10-CM | POA: Insufficient documentation

## 2023-07-14 DIAGNOSIS — Z923 Personal history of irradiation: Secondary | ICD-10-CM | POA: Insufficient documentation

## 2023-07-14 DIAGNOSIS — Z1721 Progesterone receptor positive status: Secondary | ICD-10-CM | POA: Diagnosis not present

## 2023-07-14 DIAGNOSIS — Z9013 Acquired absence of bilateral breasts and nipples: Secondary | ICD-10-CM | POA: Diagnosis not present

## 2023-07-14 DIAGNOSIS — Z1732 Human epidermal growth factor receptor 2 negative status: Secondary | ICD-10-CM | POA: Diagnosis not present

## 2023-07-14 LAB — CBC WITH DIFFERENTIAL (CANCER CENTER ONLY)
Abs Immature Granulocytes: 0.05 10*3/uL (ref 0.00–0.07)
Basophils Absolute: 0.1 10*3/uL (ref 0.0–0.1)
Basophils Relative: 1 %
Eosinophils Absolute: 0.7 10*3/uL — ABNORMAL HIGH (ref 0.0–0.5)
Eosinophils Relative: 8 %
HCT: 42.9 % (ref 36.0–46.0)
Hemoglobin: 13.4 g/dL (ref 12.0–15.0)
Immature Granulocytes: 1 %
Lymphocytes Relative: 23 %
Lymphs Abs: 2 10*3/uL (ref 0.7–4.0)
MCH: 26.6 pg (ref 26.0–34.0)
MCHC: 31.2 g/dL (ref 30.0–36.0)
MCV: 85.1 fL (ref 80.0–100.0)
Monocytes Absolute: 0.6 10*3/uL (ref 0.1–1.0)
Monocytes Relative: 7 %
Neutro Abs: 5.2 10*3/uL (ref 1.7–7.7)
Neutrophils Relative %: 60 %
Platelet Count: 344 10*3/uL (ref 150–400)
RBC: 5.04 MIL/uL (ref 3.87–5.11)
RDW: 14.3 % (ref 11.5–15.5)
WBC Count: 8.5 10*3/uL (ref 4.0–10.5)
nRBC: 0 % (ref 0.0–0.2)

## 2023-07-14 LAB — CMP (CANCER CENTER ONLY)
ALT: 46 U/L — ABNORMAL HIGH (ref 0–44)
AST: 32 U/L (ref 15–41)
Albumin: 4.4 g/dL (ref 3.5–5.0)
Alkaline Phosphatase: 86 U/L (ref 38–126)
Anion gap: 8 (ref 5–15)
BUN: 12 mg/dL (ref 6–20)
CO2: 27 mmol/L (ref 22–32)
Calcium: 9.6 mg/dL (ref 8.9–10.3)
Chloride: 103 mmol/L (ref 98–111)
Creatinine: 0.76 mg/dL (ref 0.44–1.00)
GFR, Estimated: >60 mL/min (ref >60)
Glucose, Bld: 251 mg/dL — ABNORMAL HIGH (ref 70–99)
Potassium: 4.6 mmol/L (ref 3.5–5.1)
Sodium: 138 mmol/L (ref 135–145)
Total Bilirubin: 0.3 mg/dL (ref 0.0–1.2)
Total Protein: 7.6 g/dL (ref 6.5–8.1)

## 2023-07-14 MED ORDER — LETROZOLE 2.5 MG PO TABS: 2.5000 mg | ORAL_TABLET | Freq: Every day | ORAL | 3 refills | Status: DC

## 2023-07-15 ENCOUNTER — Other Ambulatory Visit: Payer: 59

## 2023-07-15 ENCOUNTER — Ambulatory Visit: Payer: 59 | Admitting: Hematology

## 2023-07-21 DIAGNOSIS — Z09 Encounter for follow-up examination after completed treatment for conditions other than malignant neoplasm: Secondary | ICD-10-CM | POA: Diagnosis not present

## 2023-07-22 ENCOUNTER — Encounter: Payer: Self-pay | Admitting: Nurse Practitioner

## 2023-08-18 DIAGNOSIS — L910 Hypertrophic scar: Secondary | ICD-10-CM | POA: Diagnosis not present

## 2023-09-29 DIAGNOSIS — C50412 Malignant neoplasm of upper-outer quadrant of left female breast: Secondary | ICD-10-CM | POA: Diagnosis not present

## 2023-10-03 ENCOUNTER — Other Ambulatory Visit: Payer: Self-pay | Admitting: Hematology

## 2023-11-19 DIAGNOSIS — Z01818 Encounter for other preprocedural examination: Secondary | ICD-10-CM | POA: Diagnosis not present

## 2023-11-19 DIAGNOSIS — R739 Hyperglycemia, unspecified: Secondary | ICD-10-CM | POA: Diagnosis not present

## 2023-12-08 DIAGNOSIS — E1169 Type 2 diabetes mellitus with other specified complication: Secondary | ICD-10-CM | POA: Diagnosis not present

## 2024-01-03 ENCOUNTER — Ambulatory Visit (HOSPITAL_COMMUNITY)
Admission: RE | Admit: 2024-01-03 | Discharge: 2024-01-03 | Disposition: A | Discharge status: Home / Self Care | Source: Ambulatory Visit | Attending: Adult Health | Admitting: Adult Health

## 2024-01-03 DIAGNOSIS — K769 Liver disease, unspecified: Secondary | ICD-10-CM | POA: Diagnosis present

## 2024-01-03 DIAGNOSIS — C50412 Malignant neoplasm of upper-outer quadrant of left female breast: Secondary | ICD-10-CM | POA: Insufficient documentation

## 2024-01-03 DIAGNOSIS — Z170 Estrogen receptor positive status [ER+]: Secondary | ICD-10-CM | POA: Insufficient documentation

## 2024-01-03 MED ORDER — GADOBUTROL 1 MMOL/ML IV SOLN
8.0000 mL | Freq: Once | INTRAVENOUS | Status: AC | PRN
  Administered 2024-01-03: 8 mL via INTRAVENOUS

## 2024-01-04 ENCOUNTER — Telehealth: Payer: Self-pay | Admitting: Adult Health

## 2024-01-04 NOTE — Telephone Encounter (Signed)
 LMOM to review liver MRI results.    Morna Kendall, NP 01/04/24 3:01 PM Medical Oncology and Hematology [facility] [address] [place], [place] [address] Tel. [phone]    Fax. [phone]

## 2024-01-13 DIAGNOSIS — E1169 Type 2 diabetes mellitus with other specified complication: Secondary | ICD-10-CM | POA: Diagnosis not present

## 2024-01-20 DIAGNOSIS — N200 Calculus of kidney: Secondary | ICD-10-CM | POA: Diagnosis not present

## 2024-01-20 DIAGNOSIS — R310 Gross hematuria: Secondary | ICD-10-CM | POA: Diagnosis not present

## 2024-01-27 DIAGNOSIS — Z01419 Encounter for gynecological examination (general) (routine) without abnormal findings: Secondary | ICD-10-CM | POA: Diagnosis not present

## 2024-01-28 DIAGNOSIS — K8020 Calculus of gallbladder without cholecystitis without obstruction: Secondary | ICD-10-CM | POA: Diagnosis not present

## 2024-01-28 DIAGNOSIS — N200 Calculus of kidney: Secondary | ICD-10-CM | POA: Diagnosis not present
